# Patient Record
Sex: Female | Born: 1970 | Race: White | Hispanic: No | Marital: Married | Smoking: Never smoker
Health system: Southern US, Community
[De-identification: ages and names within clinical notes are randomized; demographics above are authoritative.]

## PROBLEM LIST (undated history)

## (undated) DIAGNOSIS — R350 Frequency of micturition: Secondary | ICD-10-CM

## (undated) DIAGNOSIS — F419 Anxiety disorder, unspecified: Secondary | ICD-10-CM

## (undated) DIAGNOSIS — R0789 Other chest pain: Secondary | ICD-10-CM

## (undated) DIAGNOSIS — G5712 Meralgia paresthetica, left lower limb: Principal | ICD-10-CM

## (undated) DIAGNOSIS — J45909 Unspecified asthma, uncomplicated: Secondary | ICD-10-CM

## (undated) DIAGNOSIS — E119 Type 2 diabetes mellitus without complications: Secondary | ICD-10-CM

## (undated) DIAGNOSIS — H3530 Unspecified macular degeneration: Secondary | ICD-10-CM

## (undated) DIAGNOSIS — E669 Obesity, unspecified: Secondary | ICD-10-CM

## (undated) DIAGNOSIS — R682 Dry mouth, unspecified: Secondary | ICD-10-CM

## (undated) HISTORY — PX: ABDOMINAL HYSTERECTOMY: SHX81

## (undated) HISTORY — PX: TONSILLECTOMY: SUR1361

## (undated) HISTORY — DX: Unspecified asthma, uncomplicated: J45.909

## (undated) HISTORY — DX: Other chest pain: R07.89

## (undated) HISTORY — DX: Dry mouth, unspecified: R68.2

## (undated) HISTORY — PX: ROOT CANAL: SHX2363

## (undated) HISTORY — PX: TOTAL ABDOMINAL HYSTERECTOMY: SHX209

## (undated) HISTORY — DX: Anxiety disorder, unspecified: F41.9

## (undated) HISTORY — PX: MOUTH SURGERY: SHX715

## (undated) HISTORY — DX: Unspecified macular degeneration: H35.30

## (undated) HISTORY — PX: HAND SURGERY: SHX662

## (undated) HISTORY — DX: Obesity, unspecified: E66.9

## (undated) HISTORY — DX: Frequency of micturition: R35.0

## (undated) HISTORY — DX: Meralgia paresthetica, left lower limb: G57.12

## (undated) HISTORY — PX: FOOT SURGERY: SHX648

## (undated) HISTORY — DX: Type 2 diabetes mellitus without complications: E11.9

## (undated) HISTORY — PX: NECK SURGERY: SHX720

---

## 1998-05-02 ENCOUNTER — Encounter: Admission: RE | Admit: 1998-05-02 | Discharge: 1998-05-02 | Payer: Self-pay | Admitting: Family Medicine

## 1998-07-24 ENCOUNTER — Emergency Department (HOSPITAL_COMMUNITY): Admission: EM | Admit: 1998-07-24 | Discharge: 1998-07-24 | Payer: Self-pay | Admitting: Emergency Medicine

## 1998-09-05 ENCOUNTER — Encounter: Admission: RE | Admit: 1998-09-05 | Discharge: 1998-09-05 | Payer: Self-pay | Admitting: Family Medicine

## 1998-10-02 ENCOUNTER — Encounter: Admission: RE | Admit: 1998-10-02 | Discharge: 1998-10-02 | Payer: Self-pay | Admitting: Family Medicine

## 1999-06-24 ENCOUNTER — Other Ambulatory Visit: Admission: RE | Admit: 1999-06-24 | Discharge: 1999-07-09 | Payer: Self-pay | Admitting: Family Medicine

## 1999-07-15 ENCOUNTER — Ambulatory Visit (HOSPITAL_COMMUNITY): Admission: RE | Admit: 1999-07-15 | Discharge: 1999-07-15 | Payer: Self-pay | Admitting: *Deleted

## 1999-07-15 ENCOUNTER — Encounter: Payer: Self-pay | Admitting: *Deleted

## 2001-06-22 ENCOUNTER — Other Ambulatory Visit: Admission: RE | Admit: 2001-06-22 | Discharge: 2001-06-22 | Payer: Self-pay | Admitting: Family Medicine

## 2002-12-25 ENCOUNTER — Ambulatory Visit (HOSPITAL_BASED_OUTPATIENT_CLINIC_OR_DEPARTMENT_OTHER): Admission: RE | Admit: 2002-12-25 | Discharge: 2002-12-25 | Payer: Self-pay | Admitting: Orthopedic Surgery

## 2003-04-30 ENCOUNTER — Other Ambulatory Visit: Admission: RE | Admit: 2003-04-30 | Discharge: 2003-04-30 | Payer: Self-pay | Admitting: Family Medicine

## 2003-05-23 ENCOUNTER — Encounter: Admission: RE | Admit: 2003-05-23 | Discharge: 2003-05-23 | Payer: Self-pay | Admitting: Family Medicine

## 2003-05-23 ENCOUNTER — Encounter: Payer: Self-pay | Admitting: Family Medicine

## 2003-06-14 ENCOUNTER — Ambulatory Visit (HOSPITAL_COMMUNITY): Admission: RE | Admit: 2003-06-14 | Discharge: 2003-06-14 | Payer: Self-pay | Admitting: Obstetrics & Gynecology

## 2003-06-14 ENCOUNTER — Encounter: Payer: Self-pay | Admitting: Obstetrics & Gynecology

## 2006-01-10 ENCOUNTER — Other Ambulatory Visit: Admission: RE | Admit: 2006-01-10 | Discharge: 2006-01-10 | Payer: Self-pay | Admitting: Family Medicine

## 2006-02-18 ENCOUNTER — Encounter (INDEPENDENT_AMBULATORY_CARE_PROVIDER_SITE_OTHER): Payer: Self-pay | Admitting: Specialist

## 2006-02-18 ENCOUNTER — Ambulatory Visit (HOSPITAL_COMMUNITY): Admission: RE | Admit: 2006-02-18 | Discharge: 2006-02-19 | Payer: Self-pay | Admitting: Obstetrics & Gynecology

## 2006-03-23 ENCOUNTER — Encounter: Payer: Self-pay | Admitting: Emergency Medicine

## 2009-09-10 ENCOUNTER — Encounter: Admission: RE | Admit: 2009-09-10 | Discharge: 2009-09-10 | Payer: Self-pay | Admitting: Obstetrics and Gynecology

## 2009-10-08 ENCOUNTER — Encounter: Admission: RE | Admit: 2009-10-08 | Discharge: 2009-10-08 | Payer: Self-pay | Admitting: Obstetrics and Gynecology

## 2011-04-09 NOTE — Op Note (Signed)
   NAME:  Lindsay Sanchez, Lindsay Sanchez NO.:  [id number]   MEDICAL RECORD NO.:  [id number]                   PATIENT TYPE:  AMB   LOCATION:  DSC                                  FACILITY:  [facility]   PHYSICIAN:  Cindee Salt, M.D.                    DATE OF BIRTH:  02-Jun-1971   DATE OF PROCEDURE:  12/25/2002  DATE OF DISCHARGE:                                 OPERATIVE REPORT   PREOPERATIVE DIAGNOSIS:  Carpal tunnel syndrome, left hand.   POSTOPERATIVE DIAGNOSIS:  Carpal tunnel syndrome, left hand.   OPERATION:  Decompression of left median nerve.   SURGEON:  Cindee Salt, M.D.   ASSISTANT:  Joaquin Courts, Nurse Specialist.   ANESTHESIA:  Forearm-based IV regional.   HISTORY:  The patient is a 40 year old female with a history of carpal  tunnel syndrome -- EMG and nerve conductions positive -- which has not  responded to conservative treatment to her left arm.   DESCRIPTION OF PROCEDURE:  The patient was brought to the operating room  where forearm-based IV regional anesthetic was carried out without  difficulty.  She was prepped and draped using Duraprep, supine position,  left arm free.  A longitudinal incision was made in the palm and carried  down through subcutaneous tissues.  Bleeders were electrocauterized.  Palmar  fascia was split, superficial palmar arch identified, the flexor tendons to  the ring and little fingers identified.  To the ulnar side of the median  nerve, the carpal retinaculum was incised with sharp dissection.  Right-  angle and Sewall retractor were placed between skin and forearm fascia.  The  fascia was released for approximately 3 cm proximal to the wrist crease  under direct vision.  Canal was explored.  Tenosynovial tissue was  thickened, otherwise, no further lesions were identified.  The wound was  irrigated.  The skin was closed with interrupted 5-0 nylon sutures.  Sterile  compressive dressing and splint were applied.  The patient  tolerated the  procedure well and was taken to the recovery room for observation in  satisfactory condition.   She is discharged home to return to the [facility] in one  week on Vicodin and Keflex.                                               Cindee Salt, M.D.    Angelique Blonder  D:  12/25/2002  T:  12/25/2002  Job:  [id number]

## 2011-04-09 NOTE — Op Note (Signed)
Lindsay Sanchez, Lindsay Sanchez              ACCOUNT NO.:  [id number]   MEDICAL RECORD NO.:  [id number]          PATIENT TYPE:  AMB   LOCATION:  DAY                          FACILITY:  [facility]   PHYSICIAN:  Genia Del, M.D.DATE OF BIRTH:  05/06/1971   DATE OF PROCEDURE:  02/18/2006  DATE OF DISCHARGE:                                 OPERATIVE REPORT   PREOPERATIVE DIAGNOSIS:  Menorrhagia.   POSTOPERATIVE DIAGNOSIS:  Menorrhagia with probable adenomyosis and  adhesions between bladder and anterior wall of the abdomen and between the  omentum and the anterior wall of the abdomen.   SURGEON:  Genia Del, M.D.   ASSISTANT:  Pershing Cox, M.D.   PROCTOR:  Dr. Claudette Laws.   ANESTHESIOLOGIST:  Jenelle Mages. Rica Mast, M.D.   PROCEDURE:  Laparoscopically-assisted vaginal hysterectomy assisted with  DaVinci robot.   DESCRIPTION OF PROCEDURE:  Under general anesthesia with endotracheal  intubation, the patient is in lithotomy position for operative laparoscopy.  She is prepped with Betadine on the abdominal, suprapubic, vulvar, and  vaginal areas and draped as usual.  We then put the bladder catheter in  place.  We performed a vaginal examination revealing an anteverted uterus,  slightly increased in size and no adnexal mass.  We then put the weighted  speculum in the vagina.  The anterior lip of the cervix was grasped with a  tenaculum.  The hysterometry is at 12 cm.  We dilated the cervix at dilator  #23 easily.  We then put in place the intrauterine cannula, the  RUMI with  the co-ring.  The co-ring is attached with two stitches of 0 Vicryl to the  cervix for better stability.  We then removed the tenacula and the weighted  speculum.  Abdominally, we measured with a ruler the site for the trocar.  We then infiltrate Marcaine 0.25% plain at all levels that were marked with  a sterile pen.  We then made an incision for the camera port at 23 cm from  the symphysis pubis just above  the umbilicus.  We attempt a Veress needle  insufflation, but probably because of her obesity, the pressure is higher  than average and for more security, the decision is taken to proceeding with  an open laparoscopy.  We therefore opened under direct vision the  aponeurosis at that level and a Roseanne Reno is inserted.  Pneumoperitoneum is  created with CO2.  Prior to the insertion of the trocar, a pursestring  stitch with Vicryl 0 was made on the aponeurosis.  We then inserted all of  the other ports under direct vision with the camera in place.  The two robot  arms, right and left, are done with the robotic 8 mm ports.  We have the  fourth arm on the left side with an 8 mm port as well, and we have the  assistant port on the right side, which is a 10 mm port.  When all trocars  were in place, we docked the robot and inserted the instruments which are a  [place] and shear scissors.  We put a grasper in place  in the fourth arm.  I then go to the console as my assistant stays by the bedside.  The  inspection of the abdominopelvic cavity reveals adhesions between the  omentum and the anterior abdominal wall on the left side as well as  adhesions between the bladder and the lower anterior abdominal wall.  We  released the omental adhesions with the shear scissors.  We then started the  hysterectomy.  The uterus appears slightly enlarged and very soft compatible  with adenomyosis.  Both ovaries are normal in size and appearance.  Both  tubes are normal in size and appearance except for a previous bilateral  tubal sterilization.  We started at the level of the left tube which has  already been sectioned.  We cauterized and sectioned successively at that  level and then the left utero-ovarian and continued towards the left round  ligament and then by the side of the uterus, the broad ligament is  cauterized and sectioned until we skeletonized the left uterine artery.  We  need to release the  adhesions anteriorly before opening the anterior  peritoneum.  We proceed with the shear scissors and the bipolar [place].  We then opened the anterior peritoneum and reclined the bladder downward.  The left uterine artery is cauterized close to the uterus with the [place].  We proceeded exactly the same way on the right side.  The surgery is made  longer because of the adhesions anteriorly and more difficulty reclining the  bladder, but this is done with careful dissection and no complications  occurs.  We also experienced problems with the pneumoperitoneum because of  the fourth arm failure about halfway in the case.  The fourth arm was not  necessary to finish the case.  It was therefore removed and an adequate  pneumoperitoneum was then obtained.  We then used the co-ring as a guide for  the resection of the vaginal mucosa in a circumferential fashion just above  the uterosacral ligament.  We freed the uterus completely and it is removed  from the abdominopelvic cavity through the vagina.  It is sent to pathology.  We then used a blue glove with sponges to make an obturation of the vagina  so that the pneumoperitoneum will be preserved during closure of the vaginal  vault.  We used 0 Vicryl on a CT2 to close the vaginal vault in figure-of-  eights.  The vaginal vault is well closed.  We verified hemostasis at all  pedicles and on the vaginal vault.  It is adequate.  We used fibrinogen  product in the pelvic area to improve further hemostasis.  We then evacuated  the CO2 after removing all instruments.  We undocked the robot.  We then  attached the suture at the supraumbilical incision that was on the  aponeurosis.  We put Vicryl 4-0 stitches at the skin on all incisions, a  subcuticular stitch of 4-0 Vicryl is done at the supraumbilical incision.  We then put Dermabond on each incision.  The estimated blood loss was 300 mL.  The urine output was 750 mL.  The patient received Ancef 1  gram IV at  the beginning of the surgery.  The count of instruments and sponges was  complete.  No complications occurred and the patient was brought to the  recovery room in good status.      Genia Del, M.D.  Electronically Signed     ML/MEDQ  D:  02/18/2006  T:  02/22/2006  Job:  [id number]

## 2011-09-08 ENCOUNTER — Ambulatory Visit: Payer: Self-pay | Admitting: Physical Therapy

## 2011-11-19 SURGERY — Surgical Case
Anesthesia: *Unknown

## 2011-12-03 ENCOUNTER — Other Ambulatory Visit: Payer: Self-pay | Admitting: Family Medicine

## 2011-12-03 DIAGNOSIS — Z1231 Encounter for screening mammogram for malignant neoplasm of breast: Secondary | ICD-10-CM

## 2011-12-08 ENCOUNTER — Ambulatory Visit
Admission: RE | Admit: 2011-12-08 | Discharge: 2011-12-08 | Disposition: A | Discharge status: Home / Self Care | Payer: BC Managed Care – PPO | Source: Ambulatory Visit | Attending: Neurosurgery | Admitting: Neurosurgery

## 2011-12-08 ENCOUNTER — Other Ambulatory Visit: Payer: Self-pay | Admitting: Neurosurgery

## 2011-12-08 DIAGNOSIS — M5020 Other cervical disc displacement, unspecified cervical region: Secondary | ICD-10-CM

## 2011-12-16 ENCOUNTER — Ambulatory Visit: Payer: Self-pay

## 2012-01-05 ENCOUNTER — Ambulatory Visit
Admission: RE | Admit: 2012-01-05 | Discharge: 2012-01-05 | Disposition: A | Discharge status: Home / Self Care | Payer: BC Managed Care – PPO | Source: Ambulatory Visit | Attending: Family Medicine | Admitting: Family Medicine

## 2012-01-05 DIAGNOSIS — Z1231 Encounter for screening mammogram for malignant neoplasm of breast: Secondary | ICD-10-CM

## 2012-09-27 ENCOUNTER — Encounter: Payer: Self-pay | Admitting: Cardiology

## 2012-09-27 ENCOUNTER — Ambulatory Visit (INDEPENDENT_AMBULATORY_CARE_PROVIDER_SITE_OTHER): Payer: BC Managed Care – PPO | Admitting: Cardiology

## 2012-09-27 VITALS — BP 118/80 | HR 62 | Ht 66.0 in | Wt 275.0 lb

## 2012-09-27 DIAGNOSIS — E119 Type 2 diabetes mellitus without complications: Secondary | ICD-10-CM

## 2012-09-27 DIAGNOSIS — Z8249 Family history of ischemic heart disease and other diseases of the circulatory system: Secondary | ICD-10-CM | POA: Insufficient documentation

## 2012-09-27 DIAGNOSIS — E118 Type 2 diabetes mellitus with unspecified complications: Secondary | ICD-10-CM | POA: Insufficient documentation

## 2012-09-27 DIAGNOSIS — E669 Obesity, unspecified: Secondary | ICD-10-CM

## 2012-09-27 DIAGNOSIS — R0789 Other chest pain: Secondary | ICD-10-CM

## 2012-09-27 HISTORY — DX: Obesity, unspecified: E66.9

## 2012-09-27 NOTE — Patient Instructions (Addendum)
We will schedule you for a stress Echo.  Exercise Stress Echocardiography A stress echocardiogram, sometimes called an exercise or stress echo, is a test that combines a treadmill stress test and an echocardiogram. Sometimes a treadmill test alone gives uncertain results. The addition of an echocardiogram can improve accuracy in diagnosing heart disease. A stress echo helps your caregiver know if you need more tests on your heart or further treatment. A stress echo is done to:  Look for blocked arteries in your heart.  Detect irregular heart rhythms.  Determine how hard you are able to exercise before your heart gets into trouble or develops problems.  Show how fast your heart recovers after exercise.  If for some reason you are unable to exercise, this test can also be done with the use of medications which can speed up and stress your heart. BEFORE THE PROCEDURE  You may bathe or shower the morning of the test. Do not apply body lotions or powder to your chest.  You should be present 15 minutes prior to your test or as instructed by your caregiver.  Wear comfortable clothes and good walking or running shoes.  Follow your caregiver's instructions regarding eating and drinking before the test.  Do not smoke for 4 hours prior to the test.  Take your medications as directed at regular times with water unless instructed otherwise. If you are on insulin, ask how you are to take it and if there are special instructions. It is common to adjust insulin dosing the morning of the examination.  Beta blocker medications interfere with the test. If you are on a beta blocker, do not take this medication for 72 hours before the test unless your caregiver instructs you otherwise. Be sure to ask if you do not understand. This must be discussed with your caregiver prior to your appointment.  If you wear a nitroglycerin patch, it may need to be removed prior to the test. Ask your caregiver if the patch  should be removed. PROCEDURE   An echocardiogram will be done before exercise and again at peak heart rate. The echocardiogram uses sound waves (ultrasound) to provide an image of the heart's internal structures, size and movement. This image is produced by moving a transducer (a very sensitive wand-like device) over the chest area. A jelly like substance is put on your chest to improve the contact between the wand and your chest. Images of the beating heart are made by bouncing high-frequency (ultrasound) sound waves off the heart.  Electrodes placed on the chest will monitor the heart's rate and rhythm throughout the test. If you have a hairy chest, small areas may have to be shaved to get better contact with the electrodes. Once the electrodes are attached to your body, multiple wires will be attached to the electrodes. These are then attached to an (EKG) machine. The EKG will monitor your heart's rate and rhythm (regularity).  Your caregiver (often a cardiologist) will have you walk on a treadmill. The treadmill will gradually be made to increase in speed and incline. You will exercise up to 15 minutes depending upon your condition and the condition of your heart.  The test will be stopped if you become too tired. It will also be stopped if you develop chest pain, blood pressure problems, abnormal heart rhythms or signs on the EKG that suggest the heart is overly stressed and not getting enough blood.  At the peak of exercise, the treadmill will be stopped. You will lie  down immediately on a bed so that a second echocardiogram can be taken to visualize the heart's motion with exercise.  The test usually takes 30-60 minutes to complete. HOME CARE INSTRUCTIONS   Resume your pre-hospital medications, activity and diet unless instructed otherwise.  Keep your follow-up appointments as instructed. SEEK IMMEDIATE MEDICAL CARE IF:  You have pain or pressure in the following areas:  Chest.  Jaw  or neck.  Between your shoulder blades.  Pain radiating down your left arm.  You have nausea (feeling sick to your stomach).  You vomit.  You faint.  You feel short of breath. MAKE SURE YOU:   Understand these instructions.  Will watch your condition.  Will get help right away if you are not doing well or get worse. Document Released: 11/12/2004 Document Revised: 01/31/2012 Document Reviewed: 02/18/2009 [facility] Patient Information 2013 [place], [place].

## 2012-09-27 NOTE — Progress Notes (Signed)
Lillard Anes Date of Birth: 02-07-1971 Medical Record #[id number]  History of Present Illness: Lindsay Sanchez is seen today for evaluation of chest pain. She is seen at the request of Dr. Rana Snare. Is a 41 year old white female with history of morbid obesity and diabetes mellitus. She reports that 4 weeks ago while driving in her car she developed acute mid substernal chest pain radiating into her left shoulder. It hurts so bad she had to pull over. It lasted 3-4 minutes and then abated. She went home and took a nap and felt better. She states the pressure was so severe she couldn't get her breath. Since then she has had no recurrent chest pain or pressure. She denies any history of hypercholesterolemia or hypertension. She is a nonsmoker. She does have a prior history of asthma and anxiety attacks.  Current Outpatient Prescriptions on File Prior to Visit  Medication Sig Dispense Refill  . metFORMIN (GLUMETZA) 1000 MG (MOD) 24 hr tablet Take 1,000 mg by mouth 2 (two) times daily with a meal.        No Known Allergies  Past Medical History  Diagnosis Date  . Diabetes   . Urinary frequency   . Chest tightness   . Asthma   . Anxiety     anxiety attacks are infrequent  . Obesity 09/27/2012    Past Surgical History  Procedure Date  . Neck surgery   . Cesarean section   . Hand surgery     History  Smoking status  . Never Smoker   Smokeless tobacco  . Not on file    History  Alcohol Use No    Family History  Problem Relation Age of Onset  . Arrhythmia Mother     V TACH  . Heart attack Father   . Heart attack Maternal Grandfather   . Cancer Maternal Grandfather   . Heart disease Paternal Grandmother     Review of Systems: As noted in history of present illness. All other systems were reviewed and are negative.  Physical Exam: BP 118/80  Pulse 62  Ht 5\' 6"  (1.676 m)  Wt 124.739 kg (275 lb)  BMI 44.39 kg/m2 She is a pleasant, obese white female in no acute distress.The  patient is alert and oriented x 3.  The mood and affect are normal.  The skin is warm and dry.  Color is normal.  The HEENT exam reveals that the sclera are nonicteric.  The mucous membranes are moist.  The carotids are 2+ without bruits.  There is no thyromegaly.  There is no JVD.  The lungs are clear.  The chest wall is non tender.  The heart exam reveals a regular rate with a normal S1 and S2.  There are no murmurs, gallops, or rubs.  The PMI is not displaced.   Abdominal exam reveals good bowel sounds.  There is no guarding or rebound.  There is no hepatosplenomegaly or tenderness.  There are no masses.  Exam of the legs reveal no clubbing, cyanosis, or edema.  The legs are without rashes.  The distal pulses are intact.  Cranial nerves II - XII are intact.  Motor and sensory functions are intact.  The gait is normal.  LABORATORY DATA: ECG today shows normal sinus rhythm with a normal ECG.  Assessment / Plan: 1. Atypical chest pain. Patient is at moderate risk for coronary disease given her history of diabetes and obesity. I recommended further risk stratification with a stress echo. If this  is normal we can reassure her and recommend weight loss and regular aerobic exercise. Next  2. Morbid obesity.  3. Diabetes mellitus type 2 well controlled

## 2012-10-09 ENCOUNTER — Ambulatory Visit (HOSPITAL_COMMUNITY): Payer: BC Managed Care – PPO | Attending: Cardiology

## 2012-10-09 ENCOUNTER — Encounter: Payer: Self-pay | Admitting: Cardiology

## 2012-10-09 DIAGNOSIS — R0989 Other specified symptoms and signs involving the circulatory and respiratory systems: Secondary | ICD-10-CM | POA: Insufficient documentation

## 2012-10-09 DIAGNOSIS — E119 Type 2 diabetes mellitus without complications: Secondary | ICD-10-CM

## 2012-10-09 DIAGNOSIS — R5383 Other fatigue: Secondary | ICD-10-CM | POA: Insufficient documentation

## 2012-10-09 DIAGNOSIS — R0789 Other chest pain: Secondary | ICD-10-CM

## 2012-10-09 DIAGNOSIS — R072 Precordial pain: Secondary | ICD-10-CM

## 2012-10-09 DIAGNOSIS — E669 Obesity, unspecified: Secondary | ICD-10-CM

## 2012-10-09 DIAGNOSIS — Z8249 Family history of ischemic heart disease and other diseases of the circulatory system: Secondary | ICD-10-CM | POA: Insufficient documentation

## 2012-10-09 DIAGNOSIS — R5381 Other malaise: Secondary | ICD-10-CM | POA: Insufficient documentation

## 2012-10-09 DIAGNOSIS — R0609 Other forms of dyspnea: Secondary | ICD-10-CM | POA: Insufficient documentation

## 2012-10-09 NOTE — Progress Notes (Signed)
Echocardiogram performed.  

## 2013-01-06 ENCOUNTER — Other Ambulatory Visit: Payer: Self-pay

## 2013-09-11 ENCOUNTER — Other Ambulatory Visit: Payer: Self-pay

## 2013-09-11 DIAGNOSIS — Z1231 Encounter for screening mammogram for malignant neoplasm of breast: Secondary | ICD-10-CM

## 2013-09-27 ENCOUNTER — Other Ambulatory Visit: Payer: Self-pay

## 2013-09-28 ENCOUNTER — Ambulatory Visit: Payer: BC Managed Care – PPO

## 2013-10-08 ENCOUNTER — Ambulatory Visit
Admission: RE | Admit: 2013-10-08 | Discharge: 2013-10-08 | Disposition: A | Discharge status: Home / Self Care | Payer: 59 | Source: Ambulatory Visit

## 2013-10-08 ENCOUNTER — Other Ambulatory Visit: Payer: Self-pay | Admitting: Family Medicine

## 2013-10-08 DIAGNOSIS — N63 Unspecified lump in unspecified breast: Secondary | ICD-10-CM

## 2013-10-08 DIAGNOSIS — Z1231 Encounter for screening mammogram for malignant neoplasm of breast: Secondary | ICD-10-CM

## 2013-10-22 ENCOUNTER — Other Ambulatory Visit: Payer: 59

## 2014-07-26 ENCOUNTER — Ambulatory Visit
Admission: RE | Admit: 2014-07-26 | Discharge: 2014-07-26 | Disposition: A | Discharge status: Home / Self Care | Payer: 59 | Source: Ambulatory Visit | Attending: Family Medicine | Admitting: Family Medicine

## 2014-07-26 DIAGNOSIS — N63 Unspecified lump in unspecified breast: Secondary | ICD-10-CM

## 2014-07-31 ENCOUNTER — Other Ambulatory Visit: Payer: Self-pay | Admitting: Obstetrics and Gynecology

## 2014-08-02 LAB — CYTOLOGY - PAP

## 2014-08-08 ENCOUNTER — Other Ambulatory Visit: Payer: Self-pay | Admitting: Obstetrics and Gynecology

## 2014-08-08 DIAGNOSIS — Z803 Family history of malignant neoplasm of breast: Secondary | ICD-10-CM

## 2014-08-08 DIAGNOSIS — Z9189 Other specified personal risk factors, not elsewhere classified: Secondary | ICD-10-CM

## 2014-08-23 ENCOUNTER — Ambulatory Visit
Admission: RE | Admit: 2014-08-23 | Discharge: 2014-08-23 | Disposition: A | Discharge status: Home / Self Care | Payer: 59 | Source: Ambulatory Visit | Attending: Obstetrics and Gynecology | Admitting: Obstetrics and Gynecology

## 2014-08-23 DIAGNOSIS — Z803 Family history of malignant neoplasm of breast: Secondary | ICD-10-CM

## 2014-08-23 DIAGNOSIS — Z9189 Other specified personal risk factors, not elsewhere classified: Secondary | ICD-10-CM

## 2014-08-23 MED ORDER — GADOBENATE DIMEGLUMINE 529 MG/ML IV SOLN
20.0000 mL | Freq: Once | INTRAVENOUS | Status: AC | PRN
  Administered 2014-08-23: 20 mL via INTRAVENOUS

## 2015-06-03 ENCOUNTER — Encounter: Payer: Self-pay | Admitting: Genetic Counselor

## 2015-07-04 ENCOUNTER — Encounter: Payer: Self-pay | Admitting: Genetic Counselor

## 2015-07-04 ENCOUNTER — Other Ambulatory Visit: Payer: Self-pay

## 2015-07-04 DIAGNOSIS — Z1231 Encounter for screening mammogram for malignant neoplasm of breast: Secondary | ICD-10-CM

## 2015-08-08 ENCOUNTER — Ambulatory Visit
Admission: RE | Admit: 2015-08-08 | Discharge: 2015-08-08 | Disposition: A | Discharge status: Home / Self Care | Payer: Commercial Managed Care - HMO | Source: Ambulatory Visit

## 2015-08-08 DIAGNOSIS — Z1231 Encounter for screening mammogram for malignant neoplasm of breast: Secondary | ICD-10-CM

## 2015-11-18 ENCOUNTER — Other Ambulatory Visit (HOSPITAL_BASED_OUTPATIENT_CLINIC_OR_DEPARTMENT_OTHER): Payer: Self-pay | Admitting: Family Medicine

## 2015-11-18 DIAGNOSIS — R202 Paresthesia of skin: Secondary | ICD-10-CM

## 2015-11-18 DIAGNOSIS — M545 Low back pain, unspecified: Secondary | ICD-10-CM

## 2015-11-19 ENCOUNTER — Other Ambulatory Visit: Payer: Self-pay | Admitting: Orthopedic Surgery

## 2015-11-22 ENCOUNTER — Ambulatory Visit (HOSPITAL_BASED_OUTPATIENT_CLINIC_OR_DEPARTMENT_OTHER)
Admission: RE | Admit: 2015-11-22 | Discharge: 2015-11-22 | Disposition: A | Discharge status: Home / Self Care | Payer: Commercial Managed Care - HMO | Source: Ambulatory Visit | Attending: Family Medicine | Admitting: Family Medicine

## 2015-11-22 DIAGNOSIS — Q7649 Other congenital malformations of spine, not associated with scoliosis: Secondary | ICD-10-CM | POA: Insufficient documentation

## 2015-11-22 DIAGNOSIS — Q799 Congenital malformation of musculoskeletal system, unspecified: Secondary | ICD-10-CM | POA: Insufficient documentation

## 2015-11-22 DIAGNOSIS — M545 Low back pain, unspecified: Secondary | ICD-10-CM

## 2015-11-22 DIAGNOSIS — R202 Paresthesia of skin: Secondary | ICD-10-CM | POA: Insufficient documentation

## 2015-11-22 DIAGNOSIS — R200 Anesthesia of skin: Secondary | ICD-10-CM | POA: Diagnosis not present

## 2015-11-22 DIAGNOSIS — M47896 Other spondylosis, lumbar region: Secondary | ICD-10-CM | POA: Diagnosis not present

## 2015-12-12 ENCOUNTER — Encounter: Payer: Self-pay | Admitting: Neurology

## 2015-12-12 ENCOUNTER — Ambulatory Visit (INDEPENDENT_AMBULATORY_CARE_PROVIDER_SITE_OTHER): Payer: No Typology Code available for payment source | Admitting: Neurology

## 2015-12-12 VITALS — BP 124/84 | HR 76 | Ht 67.0 in | Wt 304.0 lb

## 2015-12-12 DIAGNOSIS — G5712 Meralgia paresthetica, left lower limb: Secondary | ICD-10-CM | POA: Insufficient documentation

## 2015-12-12 HISTORY — DX: Meralgia paresthetica, left lower limb: G57.12

## 2015-12-12 NOTE — Progress Notes (Signed)
Reason for visit: Left leg numbness  Referring physician: Dr. [place]  Lindsay Sanchez is a 45 y.o. female  History of present illness:  Lindsay Sanchez is a 45 year old right-handed white female with a history of diabetes and obesity. The patient has had a two-year history of numbness and paresthesias involving the anterolateral aspect of the left thigh. She also has some problems with low back pain that appears to be worse when she is standing for too long, or partially stooped. The patient works as a Research scientist (medical), and she puts herself in a partially flexed position because of her job which increases her back pain. There is no pain that radiates down the legs. She denies any weakness of the legs, she does have some stress incontinence of the bladder that is chronic in nature. She denies any issues controlling the bowels. She has had cervical spine surgery in the past, she does have some residual neck discomfort and some right shoulder pain. She denies any pain radiating down the arms to the hands. She has had some problems with obesity, the onset of the left eye numbness came on shortly after she had regained about 100 pounds of weight. The patient has had MRI evaluation of the lumbar spine that did not show evidence of nerve root impingement. She is sent to this office for an evaluation.  Past Medical History  Diagnosis Date  . Diabetes (HCC)   . Urinary frequency   . Chest tightness   . Asthma   . Anxiety     anxiety attacks are infrequent  . Obesity 09/27/2012  . Meralgia paresthetica of left side 12/12/2015    Past Surgical History  Procedure Laterality Date  . Neck surgery    . Cesarean section    . Hand surgery    . Tonsillectomy    . Abdominal hysterectomy    . Foot surgery      Family History  Problem Relation Age of Onset  . Arrhythmia Mother     V TACH  . Breast cancer Mother 74    recurrance at 60  . Ovarian cancer Mother   . Heart attack Father   . Heart attack  Maternal Grandfather   . Cancer Maternal Grandfather   . Heart disease Paternal Grandmother   . Breast cancer Maternal Aunt   . Breast cancer Maternal Aunt   . Breast cancer Cousin     maternal first cousin    Social history:  reports that she has never smoked. She has never used smokeless tobacco. She reports that she drinks alcohol. She reports that she does not use illicit drugs.  Medications:  Prior to Admission medications   Medication Sig Start Date End Date Taking? Authorizing Provider  metFORMIN (GLUMETZA) 1000 MG (MOD) 24 hr tablet Take 1,000 mg by mouth 2 (two) times daily with a meal.   Yes Historical Provider, MD  Multiple Vitamin (MULTIVITAMIN) tablet Take 1 tablet by mouth daily.   Yes Historical Provider, MD  UNABLE TO FIND Take 1 tablet by mouth daily. Med Name: Jordanance   Yes Historical Provider, MD     No Known Allergies  ROS:  Out of a complete 14 system review of symptoms, the patient complains only of the following symptoms, and all other reviewed systems are negative.  Fatigue Ringing in the ears Itching Easy bruising Feeling cold Allergies Headache, numbness Not enough sleep, insomnia   Blood pressure 124/84, pulse 76, height  (1.702 m), weight 304  lb (137.893 kg).  Physical Exam  General: The patient is alert and cooperative at the time of the examination.The patient is markedly obese.  Eyes: Pupils are equal, round, and reactive to light. Discs are flat bilaterally.  Neck: The neck is supple, no carotid bruits are noted.  Respiratory: The respiratory examination is clear.  Cardiovascular: The cardiovascular examination reveals a regular rate and rhythm, no obvious murmurs or rubs are noted.  Neuromuscular: The patient is able to flex to about 130 with the lumbosacral spine.  Skin: Extremities are without significant edema.  Neurologic Exam  Mental status: The patient is alert and oriented x 3 at the time of the examination. The  patient has apparent normal recent and remote memory, with an apparently normal attention span and concentration ability.  Cranial nerves: Facial symmetry is present. There is good sensation of the face to pinprick and soft touch bilaterally. The strength of the facial muscles and the muscles to head turning and shoulder shrug are normal bilaterally. Speech is well enunciated, no aphasia or dysarthria is noted. Extraocular movements are full. Visual fields are full. The tongue is midline, and the patient has symmetric elevation of the soft palate. No obvious hearing deficits are noted.  Motor: The motor testing reveals 5 over 5 strength of all 4 extremities. Good symmetric motor tone is noted throughout.  Sensory: Sensory testing is intact to pinprick, soft touch, vibration sensation, and position sense on all 4 extremities. No evidence of extinction is noted.  Coordination: Cerebellar testing reveals good finger-nose-finger and heel-to-shin bilaterally.  Gait and station: Gait is normal. Tandem gait is normal. Romberg is negative. No drift is seen.  Reflexes: Deep tendon reflexes are symmetric, but are depressed  bilaterally. Toes are downgoing bilaterally.   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 Utilizing this level assignment, the current examination incorporates from the T12-L1 disc space through the lower sacrum. This will need to be understood by anyone involved in the patient's future care.  No evidence of lumbar disc herniation causing nerve root compression.  Facet degenerative changes most prominent L4-5. Minimal bilateral L4-5 foraminal narrowing without nerve root compression.  L5-S1 mild bulge without spinal stenosis.  * MRI scan images were reviewed online. I agree with the written report.    Assessment/Plan:  1. Left meralgia paresthetica   2. Chronic low back pain, neuromuscular pain    3. Diabetes   4. Obesity   The patient appears to have a left meralgia paresthetica, and some neuromuscular discomfort involving the low back. These issues are associated with being overweight. If she can lose weight, the meralgia paresthetica and back pain will disappear. The patient is considering bariatric surgery. At this point, the patient will follow-up through this office on an as-needed basis.   Marlan Palau MD 12/12/2015 3:52 PM  [facility] [address] [place], [place] [address]  Phone [phone] Fax [phone]

## 2015-12-18 ENCOUNTER — Encounter (HOSPITAL_BASED_OUTPATIENT_CLINIC_OR_DEPARTMENT_OTHER): Payer: Self-pay | Admitting: *Deleted

## 2015-12-19 ENCOUNTER — Encounter (HOSPITAL_BASED_OUTPATIENT_CLINIC_OR_DEPARTMENT_OTHER)
Admission: RE | Admit: 2015-12-19 | Discharge: 2015-12-19 | Disposition: A | Discharge status: Home / Self Care | Payer: No Typology Code available for payment source | Source: Ambulatory Visit | Attending: Orthopedic Surgery | Admitting: Orthopedic Surgery

## 2015-12-19 ENCOUNTER — Other Ambulatory Visit: Payer: Self-pay

## 2015-12-19 DIAGNOSIS — E119 Type 2 diabetes mellitus without complications: Secondary | ICD-10-CM | POA: Insufficient documentation

## 2015-12-19 DIAGNOSIS — Z01812 Encounter for preprocedural laboratory examination: Secondary | ICD-10-CM | POA: Insufficient documentation

## 2015-12-19 DIAGNOSIS — Z01810 Encounter for preprocedural cardiovascular examination: Secondary | ICD-10-CM | POA: Insufficient documentation

## 2015-12-19 LAB — BASIC METABOLIC PANEL
Anion gap: 11 (ref 5–15)
BUN: 10 mg/dL (ref 6–20)
CHLORIDE: 104 mmol/L (ref 101–111)
CO2: 25 mmol/L (ref 22–32)
CREATININE: 0.76 mg/dL (ref 0.44–1.00)
Calcium: 9.9 mg/dL (ref 8.9–10.3)
GFR calc Af Amer: >60 mL/min (ref >60)
GFR calc non Af Amer: >60 mL/min (ref >60)
GLUCOSE: 175 mg/dL — AB (ref 65–99)
Potassium: 4.7 mmol/L (ref 3.5–5.1)
Sodium: 140 mmol/L (ref 135–145)

## 2015-12-19 NOTE — Progress Notes (Signed)
PT in for PAT apt and Anesthesia consult. Seen by Dr. Okey Dupre. Ok for surgery 12/25/15 with Dr. Victorino Dike, no further testing needed.

## 2015-12-25 ENCOUNTER — Ambulatory Visit (HOSPITAL_BASED_OUTPATIENT_CLINIC_OR_DEPARTMENT_OTHER)
Admission: RE | Admit: 2015-12-25 | Discharge: 2015-12-25 | Disposition: A | Discharge status: Home / Self Care | Payer: No Typology Code available for payment source | Source: Ambulatory Visit | Attending: Orthopedic Surgery | Admitting: Orthopedic Surgery

## 2015-12-25 ENCOUNTER — Encounter (HOSPITAL_BASED_OUTPATIENT_CLINIC_OR_DEPARTMENT_OTHER): Payer: Self-pay | Admitting: Certified Registered"

## 2015-12-25 ENCOUNTER — Ambulatory Visit (HOSPITAL_BASED_OUTPATIENT_CLINIC_OR_DEPARTMENT_OTHER): Payer: No Typology Code available for payment source | Admitting: Certified Registered"

## 2015-12-25 ENCOUNTER — Encounter (HOSPITAL_BASED_OUTPATIENT_CLINIC_OR_DEPARTMENT_OTHER)
Admission: RE | Disposition: A | Discharge status: Home / Self Care | Payer: Self-pay | Source: Ambulatory Visit | Attending: Orthopedic Surgery

## 2015-12-25 DIAGNOSIS — T8484XA Pain due to internal orthopedic prosthetic devices, implants and grafts, initial encounter: Secondary | ICD-10-CM | POA: Insufficient documentation

## 2015-12-25 DIAGNOSIS — E119 Type 2 diabetes mellitus without complications: Secondary | ICD-10-CM | POA: Diagnosis not present

## 2015-12-25 DIAGNOSIS — M2041 Other hammer toe(s) (acquired), right foot: Secondary | ICD-10-CM | POA: Insufficient documentation

## 2015-12-25 DIAGNOSIS — J45909 Unspecified asthma, uncomplicated: Secondary | ICD-10-CM | POA: Diagnosis not present

## 2015-12-25 DIAGNOSIS — M7741 Metatarsalgia, right foot: Secondary | ICD-10-CM | POA: Insufficient documentation

## 2015-12-25 DIAGNOSIS — M25571 Pain in right ankle and joints of right foot: Secondary | ICD-10-CM

## 2015-12-25 DIAGNOSIS — Y831 Surgical operation with implant of artificial internal device as the cause of abnormal reaction of the patient, or of later complication, without mention of misadventure at the time of the procedure: Secondary | ICD-10-CM | POA: Insufficient documentation

## 2015-12-25 DIAGNOSIS — Z7984 Long term (current) use of oral hypoglycemic drugs: Secondary | ICD-10-CM | POA: Diagnosis not present

## 2015-12-25 DIAGNOSIS — Z6841 Body Mass Index (BMI) 40.0 and over, adult: Secondary | ICD-10-CM | POA: Diagnosis not present

## 2015-12-25 DIAGNOSIS — F411 Generalized anxiety disorder: Secondary | ICD-10-CM | POA: Diagnosis not present

## 2015-12-25 HISTORY — PX: HARDWARE REMOVAL: SHX979

## 2015-12-25 HISTORY — PX: HAMMERTOE RECONSTRUCTION WITH WEIL OSTEOTOMY: SHX5631

## 2015-12-25 LAB — GLUCOSE, CAPILLARY
GLUCOSE-CAPILLARY: 115 mg/dL — AB (ref 65–99)
Glucose-Capillary: 159 mg/dL — ABNORMAL HIGH (ref 65–99)

## 2015-12-25 SURGERY — HAMMERTOE RECONSTRUCTION WITH WEIL OSTEOTOMY
Anesthesia: Regional | Site: Foot | Laterality: Right

## 2015-12-25 MED ORDER — FENTANYL CITRATE (PF) 100 MCG/2ML IJ SOLN
INTRAMUSCULAR | Status: AC
  Filled 2015-12-25: qty 2

## 2015-12-25 MED ORDER — CHLORHEXIDINE GLUCONATE 4 % EX LIQD: 60.0000 mL | Freq: Once | CUTANEOUS | Status: DC

## 2015-12-25 MED ORDER — OXYCODONE HCL 5 MG PO TABS: 5.0000 mg | ORAL_TABLET | ORAL | Status: DC | PRN

## 2015-12-25 MED ORDER — LIDOCAINE HCL (CARDIAC) 20 MG/ML IV SOLN
INTRAVENOUS | Status: AC
  Filled 2015-12-25: qty 5

## 2015-12-25 MED ORDER — 0.9 % SODIUM CHLORIDE (POUR BTL) OPTIME
TOPICAL | Status: DC | PRN
  Administered 2015-12-25: 200 mL

## 2015-12-25 MED ORDER — DEXAMETHASONE SODIUM PHOSPHATE 10 MG/ML IJ SOLN
INTRAMUSCULAR | Status: AC
  Filled 2015-12-25: qty 1

## 2015-12-25 MED ORDER — MIDAZOLAM HCL 2 MG/2ML IJ SOLN
INTRAMUSCULAR | Status: AC
  Filled 2015-12-25: qty 2

## 2015-12-25 MED ORDER — FENTANYL CITRATE (PF) 100 MCG/2ML IJ SOLN
50.0000 ug | INTRAMUSCULAR | Status: AC | PRN
  Administered 2015-12-25 (×3): 50 ug via INTRAVENOUS

## 2015-12-25 MED ORDER — BUPIVACAINE-EPINEPHRINE (PF) 0.5% -1:200000 IJ SOLN
INTRAMUSCULAR | Status: DC | PRN
  Administered 2015-12-25: 30 mL via PERINEURAL

## 2015-12-25 MED ORDER — SCOPOLAMINE 1 MG/3DAYS TD PT72: 1.0000 | MEDICATED_PATCH | Freq: Once | TRANSDERMAL | Status: DC | PRN

## 2015-12-25 MED ORDER — DEXAMETHASONE SODIUM PHOSPHATE 10 MG/ML IJ SOLN
INTRAMUSCULAR | Status: DC | PRN
  Administered 2015-12-25: 6 mg via INTRAVENOUS

## 2015-12-25 MED ORDER — CEFAZOLIN SODIUM-DEXTROSE 2-3 GM-% IV SOLR
INTRAVENOUS | Status: AC
  Filled 2015-12-25: qty 50

## 2015-12-25 MED ORDER — SENNA 8.6 MG PO TABS: 2.0000 | ORAL_TABLET | Freq: Two times a day (BID) | ORAL | Status: DC

## 2015-12-25 MED ORDER — OXYCODONE-ACETAMINOPHEN 5-325 MG PO TABS
ORAL_TABLET | ORAL | Status: AC
  Filled 2015-12-25: qty 1

## 2015-12-25 MED ORDER — MIDAZOLAM HCL 2 MG/2ML IJ SOLN
1.0000 mg | INTRAMUSCULAR | Status: DC | PRN
  Administered 2015-12-25: 1 mg via INTRAVENOUS
  Administered 2015-12-25: 2 mg via INTRAVENOUS

## 2015-12-25 MED ORDER — OXYCODONE-ACETAMINOPHEN 5-325 MG PO TABS: 1.0000 | ORAL_TABLET | Freq: Once | ORAL | Status: DC

## 2015-12-25 MED ORDER — LIDOCAINE HCL (CARDIAC) 20 MG/ML IV SOLN
INTRAVENOUS | Status: DC | PRN
  Administered 2015-12-25: 20 mg via INTRAVENOUS

## 2015-12-25 MED ORDER — GLYCOPYRROLATE 0.2 MG/ML IJ SOLN: 0.2000 mg | Freq: Once | INTRAMUSCULAR | Status: DC | PRN

## 2015-12-25 MED ORDER — ONDANSETRON HCL 4 MG/2ML IJ SOLN
INTRAMUSCULAR | Status: AC
  Filled 2015-12-25: qty 2

## 2015-12-25 MED ORDER — LACTATED RINGERS IV SOLN
INTRAVENOUS | Status: DC
  Administered 2015-12-25 (×2): via INTRAVENOUS

## 2015-12-25 MED ORDER — SODIUM CHLORIDE 0.9 % IV SOLN: INTRAVENOUS | Status: DC

## 2015-12-25 MED ORDER — VANCOMYCIN HCL 500 MG IV SOLR
INTRAVENOUS | Status: DC | PRN
  Administered 2015-12-25: 500 mg via TOPICAL

## 2015-12-25 MED ORDER — CEFAZOLIN SODIUM-DEXTROSE 2-3 GM-% IV SOLR
2.0000 g | INTRAVENOUS | Status: AC
  Administered 2015-12-25: 3 g via INTRAVENOUS

## 2015-12-25 MED ORDER — DOCUSATE SODIUM 100 MG PO CAPS: 100.0000 mg | ORAL_CAPSULE | Freq: Two times a day (BID) | ORAL | Status: DC

## 2015-12-25 MED ORDER — PROPOFOL 10 MG/ML IV BOLUS
INTRAVENOUS | Status: DC | PRN
  Administered 2015-12-25: 260 mg via INTRAVENOUS

## 2015-12-25 MED ORDER — ONDANSETRON HCL 4 MG/2ML IJ SOLN
INTRAMUSCULAR | Status: DC | PRN
  Administered 2015-12-25: 4 mg via INTRAVENOUS

## 2015-12-25 SURGICAL SUPPLY — 79 items
BANDAGE ESMARK 6X9 LF (GAUZE/BANDAGES/DRESSINGS) ×1 IMPLANT
BLADE AVERAGE 25MMX9MM (BLADE)
BLADE AVERAGE 25X9 (BLADE) IMPLANT
BLADE LONG MED 25X9 (BLADE) ×2 IMPLANT
BLADE LONG MED 25X9MM (BLADE) ×1
BLADE OSC/SAG .038X5.5 CUT EDG (BLADE) ×3 IMPLANT
BLADE SURG 15 STRL LF DISP TIS (BLADE) ×2 IMPLANT
BLADE SURG 15 STRL SS (BLADE) ×6
BNDG CMPR 9X4 STRL LF SNTH (GAUZE/BANDAGES/DRESSINGS)
BNDG CMPR 9X6 STRL LF SNTH (GAUZE/BANDAGES/DRESSINGS) ×1
BNDG COHESIVE 4X5 TAN STRL (GAUZE/BANDAGES/DRESSINGS) ×3 IMPLANT
BNDG COHESIVE 6X5 TAN STRL LF (GAUZE/BANDAGES/DRESSINGS) IMPLANT
BNDG CONFORM 2 STRL LF (GAUZE/BANDAGES/DRESSINGS) IMPLANT
BNDG CONFORM 3 STRL LF (GAUZE/BANDAGES/DRESSINGS) ×3 IMPLANT
BNDG ESMARK 4X9 LF (GAUZE/BANDAGES/DRESSINGS) IMPLANT
BNDG ESMARK 6X9 LF (GAUZE/BANDAGES/DRESSINGS) ×3
CAP PIN PROTECTOR ORTHO WHT (CAP) IMPLANT
CHLORAPREP W/TINT 26ML (MISCELLANEOUS) ×3 IMPLANT
COVER BACK TABLE 60X90IN (DRAPES) ×3 IMPLANT
CUFF TOURNIQUET SINGLE 24IN (TOURNIQUET CUFF) ×2 IMPLANT
CUFF TOURNIQUET SINGLE 34IN LL (TOURNIQUET CUFF) IMPLANT
CUFF TOURNIQUET SINGLE 44IN (TOURNIQUET CUFF) IMPLANT
DECANTER SPIKE VIAL GLASS SM (MISCELLANEOUS) IMPLANT
DRAPE EXTREMITY T 121X128X90 (DRAPE) ×3 IMPLANT
DRAPE OEC MINIVIEW 54X84 (DRAPES) ×3 IMPLANT
DRAPE U-SHAPE 47X51 STRL (DRAPES) ×3 IMPLANT
DRSG MEPITEL 4X7.2 (GAUZE/BANDAGES/DRESSINGS) ×3 IMPLANT
DRSG PAD ABDOMINAL 8X10 ST (GAUZE/BANDAGES/DRESSINGS) ×3 IMPLANT
ELECT REM PT RETURN 9FT ADLT (ELECTROSURGICAL) ×3
ELECTRODE REM PT RTRN 9FT ADLT (ELECTROSURGICAL) ×1 IMPLANT
FUSION DEVICE 22.0 ACCTRAK (Screw) ×2 IMPLANT
GAUZE SPONGE 4X4 12PLY STRL (GAUZE/BANDAGES/DRESSINGS) ×3 IMPLANT
GLOVE BIO SURGEON STRL SZ8 (GLOVE) ×3 IMPLANT
GLOVE BIOGEL PI IND STRL 7.0 (GLOVE) IMPLANT
GLOVE BIOGEL PI IND STRL 8 (GLOVE) ×2 IMPLANT
GLOVE BIOGEL PI INDICATOR 7.0 (GLOVE) ×4
GLOVE BIOGEL PI INDICATOR 8 (GLOVE) ×4
GLOVE ECLIPSE 6.5 STRL STRAW (GLOVE) ×2 IMPLANT
GLOVE ECLIPSE 7.5 STRL STRAW (GLOVE) ×3 IMPLANT
GLOVE EXAM NITRILE MD LF STRL (GLOVE) IMPLANT
GOWN STRL REUS W/ TWL LRG LVL3 (GOWN DISPOSABLE) ×1 IMPLANT
GOWN STRL REUS W/ TWL XL LVL3 (GOWN DISPOSABLE) ×2 IMPLANT
GOWN STRL REUS W/TWL LRG LVL3 (GOWN DISPOSABLE) ×3
GOWN STRL REUS W/TWL XL LVL3 (GOWN DISPOSABLE) ×6
GUIDEWIRE ORTHO 062 (WIRE) ×2 IMPLANT
K-WIRE .054X4 (WIRE) IMPLANT
NEEDLE HYPO 22GX1.5 SAFETY (NEEDLE) IMPLANT
NS IRRIG 1000ML POUR BTL (IV SOLUTION) ×3 IMPLANT
PACK BASIN DAY SURGERY FS (CUSTOM PROCEDURE TRAY) ×3 IMPLANT
PAD CAST 4YDX4 CTTN HI CHSV (CAST SUPPLIES) ×1 IMPLANT
PADDING CAST ABS 4INX4YD NS (CAST SUPPLIES)
PADDING CAST ABS COTTON 4X4 ST (CAST SUPPLIES) IMPLANT
PADDING CAST COTTON 4X4 STRL (CAST SUPPLIES) ×3
PADDING CAST COTTON 6X4 STRL (CAST SUPPLIES) IMPLANT
PASSER SUT SWANSON 36MM LOOP (INSTRUMENTS) IMPLANT
PENCIL BUTTON HOLSTER BLD 10FT (ELECTRODE) ×3 IMPLANT
SANITIZER HAND PURELL 535ML FO (MISCELLANEOUS) ×3 IMPLANT
SCREW QUICK SNAP 2.0X12MM (Screw) ×2 IMPLANT
SHEET MEDIUM DRAPE 40X70 STRL (DRAPES) ×3 IMPLANT
SLEEVE SCD COMPRESS KNEE MED (MISCELLANEOUS) ×3 IMPLANT
SPLINT FAST PLASTER 5X30 (CAST SUPPLIES)
SPLINT PLASTER CAST FAST 5X30 (CAST SUPPLIES) IMPLANT
SPONGE LAP 18X18 X RAY DECT (DISPOSABLE) ×3 IMPLANT
STOCKINETTE 6  STRL (DRAPES) ×2
STOCKINETTE 6 STRL (DRAPES) ×1 IMPLANT
SUCTION FRAZIER HANDLE 10FR (MISCELLANEOUS) ×2
SUCTION TUBE FRAZIER 10FR DISP (MISCELLANEOUS) ×1 IMPLANT
SUT ETHILON 3 0 PS 1 (SUTURE) ×3 IMPLANT
SUT MNCRL AB 3-0 PS2 18 (SUTURE) ×3 IMPLANT
SUT VIC AB 0 SH 27 (SUTURE) IMPLANT
SUT VIC AB 2-0 SH 27 (SUTURE) ×3
SUT VIC AB 2-0 SH 27XBRD (SUTURE) IMPLANT
SYR BULB 3OZ (MISCELLANEOUS) ×3 IMPLANT
SYR CONTROL 10ML LL (SYRINGE) IMPLANT
TOWEL OR 17X24 6PK STRL BLUE (TOWEL DISPOSABLE) ×5 IMPLANT
TUBE CONNECTING 20'X1/4 (TUBING) ×1
TUBE CONNECTING 20X1/4 (TUBING) ×2 IMPLANT
UNDERPAD 30X30 (UNDERPADS AND DIAPERS) ×3 IMPLANT
YANKAUER SUCT BULB TIP NO VENT (SUCTIONS) IMPLANT

## 2015-12-25 NOTE — Op Note (Signed)
NAMECASSIE, Sanchez NO.:  [id number]  MEDICAL RECORD NO.:  [id number]  LOCATION:                                 FACILITY:  PHYSICIAN:  Toni Arthurs, MD        DATE OF BIRTH:  February 09, 1971  DATE OF PROCEDURE:  12/25/2015 DATE OF DISCHARGE:                              OPERATIVE REPORT   PREOPERATIVE DIAGNOSES: 1. Right third and fifth metatarsal metatarsalgia. 2. Right third hammertoe deformity. 3. Right fifth metatarsal painful hardware.  POSTOPERATIVE DIAGNOSES: 1. Right third and fifth metatarsal metatarsalgia. 2. Right third hammertoe deformity. 3. Right fifth metatarsal painful hardware.  PROCEDURE: 1. Right third hammertoe correction. 2. Right third metatarsal Weil osteotomy through a separate incision. 3. Right third MTP joint dorsal capsulotomy and extensor tendon     lengthening. 4. Right fifth metatarsal removal of deep implant through a separate     incision. 5. Right fifth metatarsal head exostectomy. 6. Right foot AP and lateral radiographs.  SURGEON:  Toni Arthurs, M.D.  ASSISTANT:  Alfredo Martinez, PA-C.  ANESTHESIA:  General, regional.  ESTIMATED BLOOD LOSS:  Minimal.  TOURNIQUET TIME:  36 minutes at 250 mmHg.  COMPLICATIONS:  Sanchez apparent.  DISPOSITION:  Extubated, awake, and stable to recovery.  INDICATIONS FOR PROCEDURE:  The patient is a 45 year old woman with history of right second hammertoe correction and fifth metatarsal bunionette correction.  She has painful hardware still in the fifth metatarsal as well as a plantar exostosis that is painful.  She also has a third hammertoe deformity that has progressed to a painful fixed deformity.  She presents today for operative treatment of these painful conditions having failed nonoperative treatment to date.  She understands the risks and benefits, the alternative treatment options, and elects surgical treatment.  She specifically understands risks of bleeding, infection, nerve  damage, blood clots, need for additional surgery, continued pain, recurrence of deformity, amputation, and death.  PROCEDURE IN DETAIL:  After preoperative consent was obtained and the correct operative site was identified, the patient was brought to the operating room and placed supine on the operating table.  General anesthesia was induced.  Preoperative antibiotics were administered. Surgical time-out was taken.  The right lower extremity was prepped and draped in standard sterile fashion with a tourniquet around the calf. The extremity was exsanguinated and tourniquet was inflated to 200 mmHg. A longitudinal incision was made at the previous operative site of the fifth metatarsal.  Sharp dissection was carried down through the skin and subcutaneous tissue.  The lateral joint capsule was incised and elevated plantarly and dorsally.  The screw head was identified.  It was cleared with rongeur of any overgrown bone and the screw was removed in its entirety.  Attention was then turned to the plantar aspect of the fifth metatarsal head.  The bony prominence was identified.  The oscillating saw was used to resect this prominence in line with the bone proximally.  Wound was irrigated and closed with Vicryl and nylon.  Attention was turned to the third MTP joint were a longitudinal incision was made.  Sharp dissection was carried down through the skin and subcutaneous tissue.  The extensor tendons  were identified and retracted.  The dorsal joint capsule was excised exposing the metatarsal head.  A Weil osteotomy was made with the oscillating saw removing a small wedge of bone.  The head of the metatarsal was allowed to retract several millimeters proximally and the osteotomy was fixed with a 2-mm Acumed Twist-Off screw.  The overhanging bone was trimmed with a rongeur.  Attention was then turned to the PIP joint.  The proximal incision was carried distally, and the extensor mechanism was  split longitudinally and elevated.  The head of the proximal phalanx and base of the middle phalanx were resected, and the joint was reduced and fixed with an Acumed Acutrak screw.  AP and lateral radiographs confirmed appropriate position of both screws and appropriate reduction of the hammertoe deformity.  Toes still had a bit of tendency to dorsiflex, so the extensor tendons were lengthened in Z-fashion and repaired with Monocryl.  Wound was irrigated.  Vancomycin powder was placed in the wound and it was closed with Monocryl and nylon.  Sterile dressings were applied followed by compression wrap.  Tourniquet was released at 46 minutes.  The patient was awakened from anesthesia and transported to the recovery room in stable condition.  FOLLOWUP PLAN:  The patient will bear weight on her right foot in a flat postop shoe.  She will follow up with me in the office in 2 weeks for suture removal and dressing change.  RADIOGRAPHS:  AP and lateral radiographs of the right foot were obtained intraoperatively.  These show interval correction of third hammertoe deformity with a Weil osteotomy as well as exostectomy with hardware removal of the fifth metatarsal.  No acute injuries are noted.  Hardware is remaining in second ray and is appropriately positioned.  Alfredo Martinez, PA-C, was present and scrubbed for the duration of the case.  His assistance was essential in positioning the patient, prepping and draping, gaining and maintaining exposure, performing the operation, closing and dressing the wounds, and applying the splint.     Toni Arthurs, MD     JH/MEDQ  D:  12/25/2015  T:  12/25/2015  Job:  [id number]

## 2015-12-25 NOTE — Transfer of Care (Signed)
Immediate Anesthesia Transfer of Care Note  Patient: Lindsay Sanchez  Procedure(s) Performed: Procedure(s): RIGHT THIRD HAMMERTOE CORRECTION, WEIL OSTEOTOMY (Right) RIGHT FIFTH METATARSAL DEEP IMPLANT REMOVAL, EXOSTECTOMY (Right)  Patient Location: PACU  Anesthesia Type:GA combined with regional for post-op pain  Level of Consciousness: awake, alert , oriented and patient cooperative  Airway & Oxygen Therapy: Patient Spontanous Breathing and Patient connected to face mask oxygen  Post-op Assessment: Report given to RN and Post -op Vital signs reviewed and stable  Post vital signs: Reviewed and stable  Last Vitals:  Filed Vitals:   12/25/15 1050 12/25/15 1303  BP: 118/67 146/96  Pulse: 80   Temp:    Resp: 16     Complications: No apparent anesthesia complications

## 2015-12-25 NOTE — Anesthesia Postprocedure Evaluation (Signed)
Anesthesia Post Note  Patient: Lindsay Sanchez  Procedure(s) Performed: Procedure(s) (LRB): RIGHT THIRD HAMMERTOE CORRECTION, WEIL OSTEOTOMY (Right) RIGHT FIFTH METATARSAL DEEP IMPLANT REMOVAL, EXOSTECTOMY (Right)  Patient location during evaluation: PACU Anesthesia Type: General and Regional Level of consciousness: awake and alert Pain management: pain level controlled Vital Signs Assessment: post-procedure vital signs reviewed and stable Respiratory status: spontaneous breathing Cardiovascular status: blood pressure returned to baseline Anesthetic complications: no    Last Vitals:  Filed Vitals:   12/25/15 1430 12/25/15 1454  BP: 139/94 146/82  Pulse: 74 76  Temp:  36.4 C  Resp:  16    Last Pain:  Filed Vitals:   12/25/15 1456  PainSc: 8                  Kennieth Rad

## 2015-12-25 NOTE — Progress Notes (Signed)
Assisted Dr. Rob Fitzgerald with right, ultrasound guided, popliteal block. Side rails up, monitors on throughout procedure. See vital signs in flow sheet. Tolerated Procedure well. 

## 2015-12-25 NOTE — Discharge Instructions (Addendum)
Toni Arthurs, MD [facility]  Please read the following information regarding your care after surgery.  Medications  You only need a prescription for the narcotic pain medicine (ex. oxycodone, Percocet, Norco).  All of the other medicines listed below are available over the counter. X acetominophen (Tylenol) 650 mg every 4-6 hours as you need for minor pain X oxycodone as prescribed for moderate to severe pain ?   Narcotic pain medicine (ex. oxycodone, Percocet, Vicodin) will cause constipation.  To prevent this problem, take the following medicines while you are taking any pain medicine. X docusate sodium (Colace) 100 mg twice a day X senna (Senokot) 2 tablets twice a day  Weight Bearing X Bear weight when you are able on your operated leg or foot in the flat post-op shoe.  Cast / Splint / Dressing X Keep your dressing clean and dry.  Dont put anything (coat hanger, pencil, etc) down inside of it.  If it gets damp, use a hair dryer on the cool setting to dry it.  If it gets soaked, call the office to schedule an appointment for a cast change.  After your dressing, cast or splint is removed; you may shower, but do not soak or scrub the wound.  Allow the water to run over it, and then gently pat it dry.  Swelling It is normal for you to have swelling where you had surgery.  To reduce swelling and pain, keep your toes above your nose for at least 3 days after surgery.  It may be necessary to keep your foot or leg elevated for several weeks.  If it hurts, it should be elevated.  Follow Up Call my office at [phone] when you are discharged from the hospital or surgery center to schedule an appointment to be seen two weeks after surgery.  Call my office at [phone] if you develop a fever >101.5 F, nausea, vomiting, bleeding from the surgical site or severe pain.      Regional Anesthesia Blocks  1. Numbness or the inability to move the "blocked" extremity may last  from 3-48 hours after placement. The length of time depends on the medication injected and your individual response to the medication. If the numbness is not going away after 48 hours, call your surgeon.  2. The extremity that is blocked will need to be protected until the numbness is gone and the  Strength has returned. Because you cannot feel it, you will need to take extra care to avoid injury. Because it may be weak, you may have difficulty moving it or using it. You may not know what position it is in without looking at it while the block is in effect.  3. For blocks in the legs and feet, returning to weight bearing and walking needs to be done carefully. You will need to wait until the numbness is entirely gone and the strength has returned. You should be able to move your leg and foot normally before you try and bear weight or walk. You will need someone to be with you when you first try to ensure you do not fall and possibly risk injury.  4. Bruising and tenderness at the needle site are common side effects and will resolve in a few days.  5. Persistent numbness or new problems with movement should be communicated to the surgeon or the [facility] [phone] [facility] [phone]).      Post Anesthesia Home Care Instructions  Activity: Get plenty  of rest for the remainder of the day. A responsible adult should stay with you for 24 hours following the procedure.  For the next 24 hours, DO NOT: -Drive a car -Paediatric nurse -Drink alcoholic beverages -Take any medication unless instructed by your physician -Make any legal decisions or sign important papers.  Meals: Start with liquid foods such as gelatin or soup. Progress to regular foods as tolerated. Avoid greasy, spicy, heavy foods. If nausea and/or vomiting occur, drink only clear liquids until the nausea and/or vomiting subsides. Call your physician if vomiting continues.  Special  Instructions/Symptoms: Your throat may feel dry or sore from the anesthesia or the breathing tube placed in your throat during surgery. If this causes discomfort, gargle with warm salt water. The discomfort should disappear within 24 hours.  If you had a scopolamine patch placed behind your ear for the management of post- operative nausea and/or vomiting:  1. The medication in the patch is effective for 72 hours, after which it should be removed.  Wrap patch in a tissue and discard in the trash. Wash hands thoroughly with soap and water. 2. You may remove the patch earlier than 72 hours if you experience unpleasant side effects which may include dry mouth, dizziness or visual disturbances. 3. Avoid touching the patch. Wash your hands with soap and water after contact with the patch.

## 2015-12-25 NOTE — Anesthesia Preprocedure Evaluation (Addendum)
Anesthesia Evaluation  Patient identified by MRN, date of birth, ID band Patient awake    Reviewed: Allergy & Precautions, NPO status , Patient's Chart, lab work & pertinent test results  Airway Mallampati: II  TM Distance: >3 FB Neck ROM: Full    Dental   Pulmonary asthma ,    breath sounds clear to auscultation       Cardiovascular negative cardio ROS   Rhythm:Regular Rate:Normal     Neuro/Psych PSYCHIATRIC DISORDERS Depression  Neuromuscular disease    GI/Hepatic negative GI ROS, Neg liver ROS,   Endo/Other  diabetes, Type 2, Oral Hypoglycemic AgentsMorbid obesity  Renal/GU negative Renal ROS     Musculoskeletal   Abdominal   Peds  Hematology negative hematology ROS (+)   Anesthesia Other Findings   Reproductive/Obstetrics                           Anesthesia Physical Anesthesia Plan  ASA: III  Anesthesia Plan: General and Regional   Post-op Pain Management: GA combined w/ Regional for post-op pain   Induction: Intravenous  Airway Management Planned: LMA  Additional Equipment:   Intra-op Plan:   Post-operative Plan: Extubation in OR  Informed Consent: I have reviewed the patients History and Physical, chart, labs and discussed the procedure including the risks, benefits and alternatives for the proposed anesthesia with the patient or authorized representative who has indicated his/her understanding and acceptance.   Dental advisory given  Plan Discussed with:   Anesthesia Plan Comments:        Anesthesia Quick Evaluation

## 2015-12-25 NOTE — Anesthesia Procedure Notes (Addendum)
Anesthesia Regional Block:  Popliteal block  Pre-Anesthetic Checklist: ,, timeout performed, Correct Patient, Correct Site, Correct Laterality, Correct Procedure, Correct Position, site marked, Risks and benefits discussed,  Surgical consent,  Pre-op evaluation,  At surgeon's request and post-op pain management  Laterality: Right  Prep: chloraprep       Needles:  Injection technique: Single-shot  Needle Type: Echogenic Stimulator Needle     Needle Length: 9cm 9 cm Needle Gauge: 21 and 21 G    Additional Needles:  Procedures: ultrasound guided (picture in chart) and nerve stimulator Popliteal block  Nerve Stimulator or Paresthesia:  Response: tibial, 0.5 mA,  Response: peroneal, 0.5 mA,   Additional Responses:   Narrative:  Start time: 12/25/2015 10:45 AM End time: 12/25/2015 10:52 AM Injection made incrementally with aspirations every 5 mL.  Performed by: Personally  Anesthesiologist: Marcene Duos   Procedure Name: LMA Insertion Date/Time: 12/25/2015 12:08 PM Performed by: Neyda Durango D Pre-anesthesia Checklist: Patient identified, Emergency Drugs available, Suction available and Patient being monitored Patient Re-evaluated:Patient Re-evaluated prior to inductionOxygen Delivery Method: Circle System Utilized Preoxygenation: Pre-oxygenation with 100% oxygen Intubation Type: IV induction Ventilation: Mask ventilation without difficulty LMA: LMA inserted LMA Size: 4.0 Number of attempts: 1 Airway Equipment and Method: Bite block Placement Confirmation: positive ETCO2 Tube secured with: Tape Dental Injury: Teeth and Oropharynx as per pre-operative assessment

## 2015-12-25 NOTE — H&P (Signed)
Lindsay Sanchez is an 45 y.o. female.   Chief Complaint: right foot pain HPI: 45 y/o female with painful right 3rd hammertoe and 5th MT extostosis after bunionette correction.  She presents now for correction of her 3rd metatarsalgia nd hammertoe and removal of hardware and exostectomy at the 5th MT.  Past Medical History  Diagnosis Date  . Diabetes (HCC)   . Urinary frequency   . Chest tightness   . Asthma   . Anxiety     anxiety attacks are infrequent  . Obesity 09/27/2012  . Meralgia paresthetica of left side 12/12/2015    Past Surgical History  Procedure Laterality Date  . Neck surgery    . Cesarean section    . Hand surgery    . Tonsillectomy    . Abdominal hysterectomy    . Foot surgery      Family History  Problem Relation Age of Onset  . Arrhythmia Mother     V TACH  . Breast cancer Mother 30    recurrance at 103  . Ovarian cancer Mother   . Heart attack Father   . Heart attack Maternal Grandfather   . Cancer Maternal Grandfather   . Heart disease Paternal Grandmother   . Breast cancer Maternal Aunt   . Breast cancer Maternal Aunt   . Breast cancer Cousin     maternal first cousin   Social History:  reports that she has never smoked. She has never used smokeless tobacco. She reports that she drinks alcohol. She reports that she does not use illicit drugs.  Allergies:  Allergies  Allergen Reactions  . Adhesive [Tape] Rash    And itching (when left on for extended time)    Medications Prior to Admission  Medication Sig Dispense Refill  . metFORMIN (GLUMETZA) 1000 MG (MOD) 24 hr tablet Take 1,000 mg by mouth 2 (two) times daily with a meal.    . Multiple Vitamin (MULTIVITAMIN) tablet Take 1 tablet by mouth daily.    Marland Kitchen UNABLE TO FIND Take 1 tablet by mouth daily. Med Name: Kirk Ruths      Results for orders placed or performed during the hospital encounter of 12-26-15 (from the past 48 hour(s))  Glucose, capillary     Status: Abnormal   Collection Time:  12/26/15 10:19 AM  Result Value Ref Range   Glucose-Capillary 159 (H) 65 - 99 mg/dL   No results found.  ROS  No recent f/c/n/v/wt loss  Blood pressure 118/67, pulse 80, temperature 97.8 F (36.6 C), temperature source Oral, resp. rate 16, height  (1.702 m), weight 136.76 kg (301 lb 8 oz), SpO2 100 %. Physical Exam  wn wd woman in nad.  A and o x 4.  Mood and affect normal.  EOMi.  resp ulabored.  R foot with heatlhy skin.  No lymphadenopathy.  5.5 strength in PF and DF.  Sens to LT intact.  TTP at 5th MT head.  3rd hammertoe present.   Assessment/Plan R 3rd hammertoe, metatarsalgia and 5th MT painful hardware and extosis - to OR for removal of deep implants and extostectomy and 3rd hammertoe correction.  The risks and benefits of the alternative treatment options have been discussed in detail.  The patient wishes to proceed with surgery and specifically understands risks of bleeding, infection, nerve damage, blood clots, need for additional surgery, amputation and death.   Toni Arthurs, MD 2015-12-26, 11:42 AM

## 2015-12-25 NOTE — Brief Op Note (Signed)
12/25/2015  1:18 PM  PATIENT:  Lindsay Sanchez  45 y.o. female  PRE-OPERATIVE DIAGNOSIS:  1.  Right 3rd and 5th metatarsalgia     2.  Right 3rd hammertoe     3.  Right 5th MT painful hardware       POST-OPERATIVE DIAGNOSIS:  Same  Procedure(s): 1.  Right 3rd hammertoe correction 2.  Right 3rd MT Weil osteotomy 3.  Right 3rd MTPJ dorsal capsulotomy and extensor tendon lengthening 4.  Right 5th MT removal of deep implant 5.  Right 5th MT exostectomy 6.  Right foot AP and lateral xrays  SURGEON:  Toni Arthurs, MD  ASSISTANT: Alfredo Martinez, PA-C  ANESTHESIA:   General, regional  EBL:  minimal   TOURNIQUET:   Total Tourniquet Time Documented: Calf (Right) - 36 minutes Total: Calf (Right) - 36 minutes  COMPLICATIONS:  None apparent  DISPOSITION:  Extubated, awake and stable to recovery.  DICTATION ID:  [id number]

## 2015-12-26 ENCOUNTER — Encounter (HOSPITAL_BASED_OUTPATIENT_CLINIC_OR_DEPARTMENT_OTHER): Payer: Self-pay | Admitting: Orthopedic Surgery

## 2016-01-14 ENCOUNTER — Telehealth: Payer: Self-pay | Admitting: Cardiology

## 2016-01-14 NOTE — Telephone Encounter (Signed)
Received records from [facility] for appointment with Dr [place] on 03/08/16 .  Records given to [facility] (medical records) for Dr Elvis Coil schedule on 03/08/16. lp

## 2016-01-15 ENCOUNTER — Other Ambulatory Visit (HOSPITAL_COMMUNITY): Payer: Self-pay | Admitting: General Surgery

## 2016-02-16 ENCOUNTER — Encounter: Payer: Self-pay | Admitting: Dietician

## 2016-02-16 ENCOUNTER — Encounter: Payer: No Typology Code available for payment source | Attending: General Surgery | Admitting: Dietician

## 2016-02-16 NOTE — Progress Notes (Signed)
  Pre-Op Assessment Visit:  Pre-Operative RYGB Surgery  Medical Nutrition Therapy:  Appt start time: 1410   End time:  1450.  Patient was seen on 02/16/2016 for Pre-Operative Nutrition Assessment. [facility] on 02/16/2016.   Preferred Learning Style:   No preference indicated   Learning Readiness:   Ready  Handouts given during visit include:  Pre-Op Goals Bariatric Surgery Protein Shakes   During the appointment today the following Pre-Op Goals were reviewed with the patient: Maintain or lose weight as instructed by your surgeon Make healthy food choices Begin to limit portion sizes Limited concentrated sugars and fried foods Keep fat/sugar in the single digits per serving on   food labels Practice CHEWING your food  (aim for 30 chews per bite or until applesauce consistency) Practice not drinking 15 minutes before, during, and 30 minutes after each meal/snack Avoid all carbonated beverages  Avoid/limit caffeinated beverages  Avoid all sugar-sweetened beverages Consume 3 meals per day; eat every 3-5 hours Make a list of non-food related activities Aim for 64-100 ounces of FLUID daily  Aim for at least 60-80 grams of PROTEIN daily Look for a liquid protein source that contain ?15 g protein and ?5 g carbohydrate  (ex: shakes, drinks, shots)  Patient-Centered Goals: Wants to "get rid of diabetes" and stop taking medication  10 confidence/10 importance scale  Demonstrated degree of understanding via:  Teach Back  Teaching Method Utilized:  Visual Auditory Hands on  Barriers to learning/adherence to lifestyle change: none  Patient to call the [facility] to enroll in Pre-Op and Post-Op Nutrition Education when surgery date is scheduled.

## 2016-02-16 NOTE — Patient Instructions (Signed)
Follow Pre-Op Goals Try Protein Shakes Call [facility] at [phone] when surgery is scheduled to enroll in Pre-Op Class  Things to remember:  Please always be honest with us. We want to support you!  If you have any questions or concerns in between appointments, please call or email Liz, Leslie, or Laurie.  The diet after surgery will be high protein and low in carbohydrate.  Vitamins and calcium need to be taken for the rest of your life.  Feel free to include support people in any classes or appointments.   Supplement recommendations:  Complete" Multivitamin: Sleeve Gastrectomy and RYGB patients take a double dose of MVI. LAGB patients take single dose as it is written on the package. Vitamin must be liquid or chewable but not gummy. Examples of these include Flintstones Complete and Centrum Complete. If the vitamin is bariatric-specific, take 1 dose as it is already formulated for bariatric surgery patients. Examples of these are Bariatric Advantage, Celebrate, and Wellesse. These can be found at the [facility] and/or online.     Calcium citrate: 1500 mg/day of Calcium citrate (also chewable or liquid) is recommended for all procedures. The body is only able to absorb 500-600 mg of Calcium at one time so 3 daily doses of 500 mg are recommended. Calcium doses must be taken a minimum of 2 hours apart. Additionally, Calcium must be taken 2 hours apart from iron-containing MVI. Examples of brands include Celebrate, Bariatric Advantage, and Wellesse. These brands must be purchased online or at the [facility]. Citracal Petites is the only Calcium citrate supplement found in general grocery stores and pharmacies. This is in tablet form and may be recommended for patients who do not tolerate chewable Calcium.  Continued or added Vitamin D supplementation based on individual needs.    Vitamin B12: 300-500 mcg/day for Sleeve Gastrectomy and RYGB. Optional for  LAGB patients as stomach remains fully intact. Must be taken intramuscularly, sublingually, or inhaled nasally. Oral route is not recommended. 

## 2016-03-02 ENCOUNTER — Ambulatory Visit: Payer: No Typology Code available for payment source | Admitting: Cardiology

## 2016-03-08 ENCOUNTER — Ambulatory Visit (INDEPENDENT_AMBULATORY_CARE_PROVIDER_SITE_OTHER): Payer: No Typology Code available for payment source | Admitting: Cardiology

## 2016-03-08 ENCOUNTER — Ambulatory Visit (HOSPITAL_COMMUNITY)
Admission: RE | Admit: 2016-03-08 | Discharge: 2016-03-08 | Disposition: A | Discharge status: Home / Self Care | Payer: No Typology Code available for payment source | Source: Ambulatory Visit | Attending: General Surgery | Admitting: General Surgery

## 2016-03-08 ENCOUNTER — Encounter: Payer: Self-pay | Admitting: Cardiology

## 2016-03-08 ENCOUNTER — Other Ambulatory Visit (HOSPITAL_COMMUNITY): Payer: Self-pay | Admitting: General Surgery

## 2016-03-08 VITALS — BP 136/84 | HR 74 | Ht 67.0 in | Wt 304.6 lb

## 2016-03-08 DIAGNOSIS — Z6841 Body Mass Index (BMI) 40.0 and over, adult: Secondary | ICD-10-CM | POA: Diagnosis not present

## 2016-03-08 DIAGNOSIS — E119 Type 2 diabetes mellitus without complications: Secondary | ICD-10-CM

## 2016-03-08 DIAGNOSIS — Z8249 Family history of ischemic heart disease and other diseases of the circulatory system: Secondary | ICD-10-CM

## 2016-03-08 NOTE — Patient Instructions (Signed)
We will clear you for bariatric surgery

## 2016-03-08 NOTE — Progress Notes (Signed)
Cardiology Office Note   Date:  03/08/2016   ID:  Lindsay Sanchez, DOB 02/06/1971, MRN [id number]  PCP:  Joycelyn RuaMEYERS, STEPHEN, MD  Cardiologist:   Peter SwazilandJordan, MD   Chief Complaint  Patient presents with  . Medical Clearance    pt having gastric bypass surgery      History of Present Illness: Lindsay Sanchez is a 45 y.o. female who presents for preoperative cardiac assessment for planned gastric bypass with Roux en Y by Dr Gaynelle AduEric Wilson. She has a history of DM type 2, morbid obesity, and family history of CAD. Father died at age 47 with MI and 2 cousins died at 1446 and 7151 respectively. She denies any symptoms of chest pain, dyspnea, or palpitations except when she has asthma attacks. She was evaluated with a stress Echo in 2013 that was normal. She did have extensive foot surgery in February without complications from anesthesia. Prior to this she went to the gym 3 x / week working out with treadmill, bike, and elliptical without problems.     Past Medical History  Diagnosis Date  . Diabetes (HCC)   . Urinary frequency   . Chest tightness   . Asthma   . Anxiety     anxiety attacks are infrequent  . Obesity 09/27/2012  . Meralgia paresthetica of left side 12/12/2015    Past Surgical History  Procedure Laterality Date  . Neck surgery    . Cesarean section    . Hand surgery    . Tonsillectomy    . Abdominal hysterectomy    . Foot surgery    . Hammertoe reconstruction with weil osteotomy Right 12/25/2015    Procedure: RIGHT THIRD HAMMERTOE CORRECTION, WEIL OSTEOTOMY;  Surgeon: Toni ArthursJohn Hewitt, MD;  Location: [facility];  Service: Orthopedics;  Laterality: Right;  . Hardware removal Right 12/25/2015    Procedure: RIGHT FIFTH METATARSAL DEEP IMPLANT REMOVAL, EXOSTECTOMY;  Surgeon: Toni ArthursJohn Hewitt, MD;  Location: [facility];  Service: Orthopedics;  Laterality: Right;     Current Outpatient Prescriptions  Medication Sig Dispense Refill  . cholecalciferol  (VITAMIN D) 1000 units tablet Take 1,000 Units by mouth daily.    . metFORMIN (GLUMETZA) 1000 MG (MOD) 24 hr tablet Take 1,000 mg by mouth 2 (two) times daily with a meal.    . Multiple Vitamin (MULTIVITAMIN) tablet Take 1 tablet by mouth daily.    . vitamin C (ASCORBIC ACID) 500 MG tablet Take 500 mg by mouth daily.     No current facility-administered medications for this visit.    Allergies:   Adhesive    Social History:  The patient  reports that she has never smoked. She has never used smokeless tobacco. She reports that she drinks alcohol. She reports that she does not use illicit drugs.   Family History:  The patient's family history includes Arrhythmia in her mother; Breast cancer in her cousin, maternal aunt, and maternal aunt; Breast cancer (age of onset: 4651) in her mother; Cancer in her maternal grandfather; Heart attack in her maternal grandfather; Heart attack (age of onset: 3549) in her father; Heart disease in her paternal grandmother; Ovarian cancer in her mother.    ROS:  Please see the history of present illness.  She does have seasonal asthma/allergies. Otherwise, review of systems are positive for none.   All other systems are reviewed and negative.    PHYSICAL EXAM: VS:  BP 136/84 mmHg  Pulse 74  Ht 5\' 7"  (  1.702 m)  Wt 138.166 kg (304 lb 9.6 oz)  BMI 47.70 kg/m2 , BMI Body mass index is 47.7 kg/(m^2). GEN: Well nourished, obese, in no acute distress HEENT: normal Neck: no JVD, carotid bruits, or masses Cardiac: RRR; no murmurs, rubs, or gallops,no edema  Respiratory:  clear to auscultation bilaterally, normal work of breathing GI: soft, nontender, nondistended, + BS MS: no deformity or atrophy Skin: warm and dry, no rash Neuro:  Strength and sensation are intact Psych: euthymic mood, full affect   EKG:  EKG is ordered today. The ekg ordered today demonstrates NSR with rate 74. Normal Ecg. I have personally reviewed and interpreted this study.    Recent  Labs: 12/19/2015: BUN 10; Creatinine, Ser 0.76; Potassium 4.7; Sodium 140    Lipid Panel No results found for: CHOL, TRIG, HDL, CHOLHDL, VLDL, LDLCALC, LDLDIRECT    Wt Readings from Last 3 Encounters:  03/08/16 138.166 kg (304 lb 9.6 oz)  02/16/16 136.941 kg (301 lb 14.4 oz)  12/25/15 136.76 kg (301 lb 8 oz)      Other studies Reviewed: Additional studies/ records that were reviewed today include: Surgery office notes. Review of the above records demonstrates: as noted   ASSESSMENT AND PLAN:  1.  Morbid obesity. From a cardiac standpoint I think she is low risk for planned gastric bypass surgery. She has no active cardiac symptoms. Ecg and exam are normal. Stress Echo in 2013 was normal. Recent anesthesia without problems.  2. DM type 2, 3. Family history of early CAD.   Current medicines are reviewed at length with the patient today.  The patient does not have concerns regarding medicines.  The following changes have been made:  no change  Labs/ tests ordered today include:  Orders Placed This Encounter  Procedures  . EKG 12-Lead     Disposition:   FU prn  Signed, Peter Swaziland, MD  03/08/2016 12:42 PM    [facility] [address] [place], [place], [address] Phone [phone], Fax [phone]

## 2017-01-04 ENCOUNTER — Telehealth: Payer: Self-pay | Admitting: Cardiovascular Disease

## 2017-01-04 NOTE — Telephone Encounter (Signed)
Received records from [facility] for appointment on 01/07/17 with Dr Allyson SabalBerry.  Records put with Dr Hazle CocaBerry's schedule for 01/07/17. lp

## 2017-01-07 ENCOUNTER — Ambulatory Visit: Payer: No Typology Code available for payment source | Admitting: Cardiovascular Disease

## 2017-01-07 ENCOUNTER — Encounter: Payer: Self-pay | Admitting: Physician Assistant

## 2017-01-07 ENCOUNTER — Ambulatory Visit (INDEPENDENT_AMBULATORY_CARE_PROVIDER_SITE_OTHER): Payer: No Typology Code available for payment source | Admitting: Physician Assistant

## 2017-01-07 VITALS — BP 124/82 | HR 87 | Ht 67.0 in | Wt 290.0 lb

## 2017-01-07 DIAGNOSIS — E785 Hyperlipidemia, unspecified: Secondary | ICD-10-CM

## 2017-01-07 DIAGNOSIS — R079 Chest pain, unspecified: Secondary | ICD-10-CM

## 2017-01-07 DIAGNOSIS — E118 Type 2 diabetes mellitus with unspecified complications: Secondary | ICD-10-CM | POA: Diagnosis not present

## 2017-01-07 DIAGNOSIS — M79604 Pain in right leg: Secondary | ICD-10-CM

## 2017-01-07 DIAGNOSIS — M79605 Pain in left leg: Secondary | ICD-10-CM | POA: Diagnosis not present

## 2017-01-07 MED ORDER — ASPIRIN EC 81 MG PO TBEC: 81.0000 mg | DELAYED_RELEASE_TABLET | Freq: Every day | ORAL | 3 refills | Status: DC

## 2017-01-07 NOTE — Patient Instructions (Addendum)
Medication Instructions:  START taking aspirin 81 mg-one time daily.  This may be purchased over the counter.  Labwork: NONE  Testing/Procedures: Your physician has requested that you have a 2 day exercise stress myoview. For further information please visit [email or web address] Please follow instruction sheet, as given.  Your physician has requested that you have an ankle brachial index (ABI). During this test an ultrasound and blood pressure cuff are used to evaluate the arteries that supply the arms and legs with blood. Allow thirty minutes for this exam. There are no restrictions or special instructions.   Follow-Up: If stress test is normal-follow up with Dr. SwazilandJordan in 6 months. If abnormal-we will call you with results and will schedule follow up appointment at that time.    Any Other Special Instructions Will Be Listed Below (If Applicable).  We suggest you have a lipid panel in 2 months with your PCP and have results faxed to our office at [phone]   If you need a refill on your cardiac medications before your next appointment, please call your pharmacy.

## 2017-01-07 NOTE — Progress Notes (Signed)
Cardiology Office Note    Date:  01/07/2017   ID:  Lindsay AnesJeanette V Stamas, DOB 02/02/1971, MRN [id number]  PCP:  Joycelyn RuaMEYERS, STEPHEN, MD  Cardiologist:  Dr. SwazilandJordan  Chief Complaint  Patient presents with  . Follow-up    seen for Dr. SwazilandJordan, episodic chest pain and leg pain    History of Present Illness:  Lindsay Sanchez is a 46 y.o. female with PMH of DM II and morbid obesity. She has significant family history of CAD with her father died at age 48 with MI and to cousin died at age 48 and a 3951 respectively. She also had a stress echo in 2013 that was normal. She was last seen by Dr. SwazilandJordan on 03/08/2016, at which time she was doing well and going to gym 3 times a week. She was under consideration for Roux-en-Y surgery by Dr. Gaynelle AduEric Wilson, she was cleared for surgical procedure without additional workup. This was never done due to insurance reasons.  It appears patient was originally scheduled to see Dr. Allyson SabalBerry today as a new patient, however this was before it was realized Dr. SwazilandJordan is her primary cardiologist. Since her last office visit with Dr. SwazilandJordan, unfortunately she has lost another 2 brothers to heart attack. This has caused significant amount of stress as she has lost multiple family members from her father's side due to heart attacks at very young age. She is concerned about her kids if something were to happen to her. In the last 6 months, she has had at least 3 episodes of chest pain, none of which lasted very long. The last episode of chest pain was 3 weeks ago, she says she was driving at the time. She described it as a pressure-like sensation in the substernal area. It lasted roughly 4 minutes before going away. EKG today showed normal sinus rhythm without significant ST-T wave changes, it does have 1 mm Q wave in the inferior lead, however my suspicion of inferior MI in the past is quite low. Her symptoms sounds atypical, I recommended a treadmill nuclear stress test. She also mentions she  has worsening burning sensation in her leg as well especially noticeable with ambulation. Sometimes she had to stop after walking a certain distance. Of course, her  morbid obesity and knee problem likely contributed partially to the symptom. She also mentions she has a constant tingling sensation in the left thigh which is worse when she is laying on that side. I am more concerned about the exertional burning sensation in the thigh we will obtain ABI given her family history. We have also reviewed her labs from the PCPs office. She recently had a hemoglobin A1c of 6.2 on January 25 indicative of good glucose control. Her last lipid panel was obtained on 06/18/2016 which showed cholesterol 220, HDL 63, triglyceride 174, LDL 122. Since then she has cut back on bread and is trying increased activity level. I recommended a 2 month fasting lipid test and have the results faxed to cardiology office. Then we'll do decide whether or not she should start on statin therapy. If the treadmill nuclear stress test is negative, she can follow-up with Dr. SwazilandJordan in 6 months. If it is positive, we will see her earlier. She should also start on aspirin 81 mg daily.   Past Medical History:  Diagnosis Date  . Anxiety    anxiety attacks are infrequent  . Asthma   . Chest tightness   . Diabetes (HCC)   .  Meralgia paresthetica of left side 12/12/2015  . Obesity 09/27/2012  . Urinary frequency     Past Surgical History:  Procedure Laterality Date  . ABDOMINAL HYSTERECTOMY    . CESAREAN SECTION    . FOOT SURGERY    . HAMMERTOE RECONSTRUCTION WITH WEIL OSTEOTOMY Right 12/25/2015   Procedure: RIGHT THIRD HAMMERTOE CORRECTION, WEIL OSTEOTOMY;  Surgeon: Toni Arthurs, MD;  Location: [facility];  Service: Orthopedics;  Laterality: Right;  . HAND SURGERY    . HARDWARE REMOVAL Right 12/25/2015   Procedure: RIGHT FIFTH METATARSAL DEEP IMPLANT REMOVAL, EXOSTECTOMY;  Surgeon: Toni Arthurs, MD;  Location: [facility];  Service: Orthopedics;  Laterality: Right;  . NECK SURGERY    . TONSILLECTOMY      Current Medications: Outpatient Medications Prior to Visit  Medication Sig Dispense Refill  . cholecalciferol (VITAMIN D) 1000 units tablet Take 1,000 Units by mouth daily.    . metFORMIN (GLUMETZA) 1000 MG (MOD) 24 hr tablet Take 1,000 mg by mouth 2 (two) times daily with a meal.    . Multiple Vitamin (MULTIVITAMIN) tablet Take 1 tablet by mouth daily.    . vitamin C (ASCORBIC ACID) 500 MG tablet Take 500 mg by mouth daily.     No facility-administered medications prior to visit.      Allergies:   Adhesive [tape]   Social History   Social History  . Marital status: Married    Spouse name: N/A  . Number of children: 2  . Years of education: 14   Occupational History  . pet groomer    Social History Main Topics  . Smoking status: Never Smoker  . Smokeless tobacco: Never Used  . Alcohol use Yes  . Drug use: No  . Sexual activity: Not Asked   Other Topics Concern  . None   Social History Narrative   Patient drinks about 1 cup of caffeine daily.   Patient is right handed.      Family History:  The patient's family history includes Arrhythmia in her mother; Breast cancer in her cousin, maternal aunt, and maternal aunt; Breast cancer (age of onset: 72) in her mother; Cancer in her maternal grandfather; Heart attack in her brother and maternal grandfather; Heart attack (age of onset: 37) in her father; Heart disease in her paternal grandmother; Ovarian cancer in her mother; Sudden Cardiac Death in her brother.   ROS:   Please see the history of present illness.    ROS All other systems reviewed and are negative.   PHYSICAL EXAM:   VS:  BP 124/82   Pulse 87   Ht 5\' 7"  (1.702 m)   Wt 290 lb (131.5 kg)   SpO2 98%   BMI 45.42 kg/m    GEN: Well nourished, well developed, in no acute distress  HEENT: normal  Neck: no JVD, carotid bruits, or masses Cardiac: RRR; no  murmurs, rubs, or gallops,no edema  Respiratory:  clear to auscultation bilaterally, normal work of breathing GI: soft, nontender, nondistended, + BS MS: no deformity or atrophy  Skin: warm and dry, no rash Neuro:  Alert and Oriented x 3, Strength and sensation are intact Psych: euthymic mood, full affect  Wt Readings from Last 3 Encounters:  01/07/17 290 lb (131.5 kg)  03/08/16 (!) 304 lb 9.6 oz (138.2 kg)  02/16/16 (!) 301 lb 14.4 oz (136.9 kg)      Studies/Labs Reviewed:   EKG:  EKG is ordered today.  The ekg ordered today  demonstrates NSR without significant ST-T wave changes  Recent Labs: No results found for requested labs within last 8760 hours.   Lipid Panel No results found for: CHOL, TRIG, HDL, CHOLHDL, VLDL, LDLCALC, LDLDIRECT  Additional studies/ records that were reviewed today include:   Stress Echo 10/09/2012 - Stress ECG conclusions: The stress ECG was normal. Fair exercise capacity. - Baseline: LV global systolic function was normal. The estimated LV ejection fraction was 60%. Normal wall motion; no LV regional wall motion abnormalities. - Peak stress: LV global systolic function was vigorous. Normal wall motion; no LV regional wall motion abnormalities. - Impressions: Normal increase in thickening and contractility in all segments. Normal stress echo. Impressions:  - Normal increase in thickening and contractility in all segments. Normal stress echo.     ASSESSMENT:    1. Chest pain at rest   2. Bilateral lower extremity pain   3. Morbid obesity (HCC)   4. Controlled type 2 diabetes mellitus with complication, without long-term current use of insulin (HCC)   5. Hyperlipidemia, unspecified hyperlipidemia type      PLAN:  In order of problems listed above:  1. Chest pain at rest: She has at least 3 episodes of chest pain at rest in the past 6 months. Last episode was 3 weeks ago while she was in her car. It lasted a total of 4  minutes. Given her significant family history, we will pursue 2 day unknown nuclear stress test. Some of this can also be attributed to anxiety, she has lost 2 more brother recently due to premature MI. This is on top of the fact that she lost 2 cousins when they are in their 17s and also her father died in his 34s as well.  2. Bilateral lower extremity pain: Seems to occur more so with ambulation, she also complained of a burning sensation over her left thigh that is worse when she is laying on that side. Some of this is likely related to obesity.  3. DM II: On glimepiride, her recent hemoglobin A1c was 6.2.  4. Hyperlipidemia: Last lipid panel obtained in July 2017 showed lessor 220, HDL 63, triglyceride 174, LDL 122. She has cut back on carbohydrate since. I recommended recheck fasting lipid panel in 2 month at her PCPs office and have results sent to [place]. If it is still elevated, we will need to consider adding statin medication.   5. Morbid obesity: Ultimately, I think losing weight will help with her cholesterol, diabetes, and lower extremity pain, and decrease her cardiac risk factor.   Medication Adjustments/Labs and Tests Ordered: Current medicines are reviewed at length with the patient today.  Concerns regarding medicines are outlined above.  Medication changes, Labs and Tests ordered today are listed in the Patient Instructions below. Patient Instructions  Medication Instructions:  START taking aspirin 81 mg-one time daily.  This may be purchased over the counter.  Labwork: NONE  Testing/Procedures: Your physician has requested that you have a 2 day exercise stress myoview. For further information please visit [email or web address] Please follow instruction sheet, as given.  Your physician has requested that you have an ankle brachial index (ABI). During this test an ultrasound and blood pressure cuff are used to evaluate the arteries that supply the arms and legs with blood. Allow  thirty minutes for this exam. There are no restrictions or special instructions.   Follow-Up: If stress test is normal-follow up with Dr. Swaziland in 6 months. If abnormal-we will call you with results and  will schedule follow up appointment at that time.    Any Other Special Instructions Will Be Listed Below (If Applicable).  We suggest you have a lipid panel in 2 months with your PCP and have results faxed to our office at [phone]   If you need a refill on your cardiac medications before your next appointment, please call your pharmacy.      Ramond Dial, [place]  01/07/2017 4:02 PM    [facility] [address] [place], [place], [place]  [address] Phone: [phone]; Fax: [phone]

## 2017-01-13 ENCOUNTER — Telehealth (HOSPITAL_COMMUNITY): Payer: Self-pay

## 2017-01-13 NOTE — Telephone Encounter (Signed)
Encounter complete. 

## 2017-01-18 ENCOUNTER — Ambulatory Visit (HOSPITAL_COMMUNITY)
Admission: RE | Admit: 2017-01-18 | Discharge: 2017-01-18 | Disposition: A | Discharge status: Home / Self Care | Payer: No Typology Code available for payment source | Source: Ambulatory Visit | Attending: Cardiovascular Disease | Admitting: Cardiovascular Disease

## 2017-01-18 DIAGNOSIS — Z8249 Family history of ischemic heart disease and other diseases of the circulatory system: Secondary | ICD-10-CM | POA: Diagnosis not present

## 2017-01-18 DIAGNOSIS — E119 Type 2 diabetes mellitus without complications: Secondary | ICD-10-CM | POA: Insufficient documentation

## 2017-01-18 DIAGNOSIS — R0609 Other forms of dyspnea: Secondary | ICD-10-CM | POA: Insufficient documentation

## 2017-01-18 DIAGNOSIS — E669 Obesity, unspecified: Secondary | ICD-10-CM | POA: Insufficient documentation

## 2017-01-18 DIAGNOSIS — R079 Chest pain, unspecified: Secondary | ICD-10-CM | POA: Diagnosis not present

## 2017-01-18 DIAGNOSIS — Z6841 Body Mass Index (BMI) 40.0 and over, adult: Secondary | ICD-10-CM | POA: Diagnosis not present

## 2017-01-18 MED ORDER — TECHNETIUM TC 99M TETROFOSMIN IV KIT
32.1000 | PACK | Freq: Once | INTRAVENOUS | Status: AC | PRN
  Administered 2017-01-18: 32.1 via INTRAVENOUS
  Filled 2017-01-18: qty 33

## 2017-01-19 ENCOUNTER — Ambulatory Visit (HOSPITAL_COMMUNITY)
Admission: RE | Admit: 2017-01-19 | Discharge: 2017-01-19 | Disposition: A | Discharge status: Home / Self Care | Payer: No Typology Code available for payment source | Source: Ambulatory Visit | Attending: Cardiovascular Disease | Admitting: Cardiovascular Disease

## 2017-01-19 LAB — MYOCARDIAL PERFUSION IMAGING
CHL CUP MPHR: 172 {beats}/min
CHL CUP NUCLEAR SDS: 0
CHL CUP NUCLEAR SRS: 0
CHL CUP RESTING HR STRESS: 68 {beats}/min
CSEPPHR: 155 {beats}/min
Estimated workload: 7 METS
Exercise duration (min): 7 min
Exercise duration (sec): 0 s
LVDIAVOL: 87 mL (ref 46–106)
LVSYSVOL: 35 mL
Percent HR: 90 %
RPE: 18
SSS: 0
TID: 0.95

## 2017-01-19 MED ORDER — TECHNETIUM TC 99M TETROFOSMIN IV KIT
31.0000 | PACK | Freq: Once | INTRAVENOUS | Status: AC | PRN
  Administered 2017-01-19: 31 via INTRAVENOUS

## 2017-01-24 ENCOUNTER — Other Ambulatory Visit: Payer: Self-pay | Admitting: Physician Assistant

## 2017-01-24 DIAGNOSIS — I739 Peripheral vascular disease, unspecified: Secondary | ICD-10-CM

## 2017-01-28 ENCOUNTER — Inpatient Hospital Stay (HOSPITAL_COMMUNITY): Admission: RE | Admit: 2017-01-28 | Payer: No Typology Code available for payment source | Source: Ambulatory Visit

## 2017-02-18 ENCOUNTER — Ambulatory Visit (HOSPITAL_COMMUNITY)
Admission: RE | Admit: 2017-02-18 | Discharge: 2017-02-18 | Disposition: A | Discharge status: Home / Self Care | Payer: No Typology Code available for payment source | Source: Ambulatory Visit | Attending: Internal Medicine | Admitting: Internal Medicine

## 2017-02-18 DIAGNOSIS — E1151 Type 2 diabetes mellitus with diabetic peripheral angiopathy without gangrene: Secondary | ICD-10-CM | POA: Insufficient documentation

## 2017-02-18 DIAGNOSIS — E785 Hyperlipidemia, unspecified: Secondary | ICD-10-CM | POA: Diagnosis not present

## 2017-02-18 DIAGNOSIS — M79604 Pain in right leg: Secondary | ICD-10-CM | POA: Diagnosis not present

## 2017-02-18 DIAGNOSIS — M79605 Pain in left leg: Secondary | ICD-10-CM | POA: Diagnosis not present

## 2017-02-18 DIAGNOSIS — I739 Peripheral vascular disease, unspecified: Secondary | ICD-10-CM | POA: Diagnosis not present

## 2017-05-24 ENCOUNTER — Ambulatory Visit (HOSPITAL_COMMUNITY)
Admission: RE | Admit: 2017-05-24 | Discharge: 2017-05-24 | Disposition: A | Discharge status: Home / Self Care | Payer: No Typology Code available for payment source | Source: Ambulatory Visit | Attending: Surgery | Admitting: Surgery

## 2017-05-24 ENCOUNTER — Other Ambulatory Visit: Payer: Self-pay | Admitting: Surgery

## 2017-05-24 ENCOUNTER — Encounter (HOSPITAL_BASED_OUTPATIENT_CLINIC_OR_DEPARTMENT_OTHER): Payer: No Typology Code available for payment source | Attending: Surgery

## 2017-05-24 DIAGNOSIS — B952 Enterococcus as the cause of diseases classified elsewhere: Secondary | ICD-10-CM | POA: Insufficient documentation

## 2017-05-24 DIAGNOSIS — L97822 Non-pressure chronic ulcer of other part of left lower leg with fat layer exposed: Secondary | ICD-10-CM | POA: Insufficient documentation

## 2017-05-24 DIAGNOSIS — Z79899 Other long term (current) drug therapy: Secondary | ICD-10-CM | POA: Diagnosis not present

## 2017-05-24 DIAGNOSIS — L97529 Non-pressure chronic ulcer of other part of left foot with unspecified severity: Secondary | ICD-10-CM

## 2017-05-24 DIAGNOSIS — E11622 Type 2 diabetes mellitus with other skin ulcer: Secondary | ICD-10-CM | POA: Insufficient documentation

## 2017-05-24 DIAGNOSIS — Z6841 Body Mass Index (BMI) 40.0 and over, adult: Secondary | ICD-10-CM | POA: Insufficient documentation

## 2017-05-24 DIAGNOSIS — Z7982 Long term (current) use of aspirin: Secondary | ICD-10-CM | POA: Insufficient documentation

## 2017-05-24 DIAGNOSIS — B9561 Methicillin susceptible Staphylococcus aureus infection as the cause of diseases classified elsewhere: Secondary | ICD-10-CM | POA: Diagnosis not present

## 2017-06-03 ENCOUNTER — Encounter (HOSPITAL_BASED_OUTPATIENT_CLINIC_OR_DEPARTMENT_OTHER): Payer: No Typology Code available for payment source

## 2017-06-03 ENCOUNTER — Other Ambulatory Visit (HOSPITAL_COMMUNITY)
Admission: RE | Admit: 2017-06-03 | Discharge: 2017-06-03 | Disposition: A | Discharge status: Home / Self Care | Payer: No Typology Code available for payment source | Source: Other Acute Inpatient Hospital | Attending: Internal Medicine | Admitting: Internal Medicine

## 2017-06-03 DIAGNOSIS — E11622 Type 2 diabetes mellitus with other skin ulcer: Secondary | ICD-10-CM | POA: Diagnosis not present

## 2017-06-03 DIAGNOSIS — L97222 Non-pressure chronic ulcer of left calf with fat layer exposed: Secondary | ICD-10-CM | POA: Diagnosis not present

## 2017-06-06 LAB — AEROBIC CULTURE  (SUPERFICIAL SPECIMEN)

## 2017-06-06 LAB — AEROBIC CULTURE W GRAM STAIN (SUPERFICIAL SPECIMEN)

## 2017-06-10 DIAGNOSIS — E11622 Type 2 diabetes mellitus with other skin ulcer: Secondary | ICD-10-CM | POA: Diagnosis not present

## 2017-06-17 DIAGNOSIS — E11622 Type 2 diabetes mellitus with other skin ulcer: Secondary | ICD-10-CM | POA: Diagnosis not present

## 2017-06-24 ENCOUNTER — Encounter (HOSPITAL_BASED_OUTPATIENT_CLINIC_OR_DEPARTMENT_OTHER): Payer: No Typology Code available for payment source | Attending: Internal Medicine

## 2017-06-24 DIAGNOSIS — E1140 Type 2 diabetes mellitus with diabetic neuropathy, unspecified: Secondary | ICD-10-CM | POA: Insufficient documentation

## 2017-06-24 DIAGNOSIS — E11622 Type 2 diabetes mellitus with other skin ulcer: Secondary | ICD-10-CM | POA: Diagnosis not present

## 2017-06-24 DIAGNOSIS — Z6841 Body Mass Index (BMI) 40.0 and over, adult: Secondary | ICD-10-CM | POA: Diagnosis not present

## 2017-06-24 DIAGNOSIS — L97822 Non-pressure chronic ulcer of other part of left lower leg with fat layer exposed: Secondary | ICD-10-CM | POA: Insufficient documentation

## 2017-07-01 DIAGNOSIS — E11622 Type 2 diabetes mellitus with other skin ulcer: Secondary | ICD-10-CM | POA: Diagnosis not present

## 2017-07-08 DIAGNOSIS — E11622 Type 2 diabetes mellitus with other skin ulcer: Secondary | ICD-10-CM | POA: Diagnosis not present

## 2017-07-15 ENCOUNTER — Other Ambulatory Visit (HOSPITAL_COMMUNITY)
Admission: RE | Admit: 2017-07-15 | Discharge: 2017-07-15 | Disposition: A | Discharge status: Home / Self Care | Payer: No Typology Code available for payment source | Source: Other Acute Inpatient Hospital | Attending: Internal Medicine | Admitting: Internal Medicine

## 2017-07-15 DIAGNOSIS — L97222 Non-pressure chronic ulcer of left calf with fat layer exposed: Secondary | ICD-10-CM | POA: Insufficient documentation

## 2017-07-15 DIAGNOSIS — E11622 Type 2 diabetes mellitus with other skin ulcer: Secondary | ICD-10-CM | POA: Diagnosis not present

## 2017-07-18 LAB — AEROBIC CULTURE W GRAM STAIN (SUPERFICIAL SPECIMEN)

## 2017-07-18 LAB — AEROBIC CULTURE  (SUPERFICIAL SPECIMEN)

## 2017-07-22 DIAGNOSIS — E11622 Type 2 diabetes mellitus with other skin ulcer: Secondary | ICD-10-CM | POA: Diagnosis not present

## 2017-07-29 ENCOUNTER — Encounter (HOSPITAL_BASED_OUTPATIENT_CLINIC_OR_DEPARTMENT_OTHER): Payer: No Typology Code available for payment source | Attending: Internal Medicine

## 2017-07-29 DIAGNOSIS — E11622 Type 2 diabetes mellitus with other skin ulcer: Secondary | ICD-10-CM | POA: Insufficient documentation

## 2017-07-29 DIAGNOSIS — Z6841 Body Mass Index (BMI) 40.0 and over, adult: Secondary | ICD-10-CM | POA: Diagnosis not present

## 2017-07-29 DIAGNOSIS — L97822 Non-pressure chronic ulcer of other part of left lower leg with fat layer exposed: Secondary | ICD-10-CM | POA: Diagnosis not present

## 2017-07-29 DIAGNOSIS — E1140 Type 2 diabetes mellitus with diabetic neuropathy, unspecified: Secondary | ICD-10-CM | POA: Insufficient documentation

## 2017-08-01 ENCOUNTER — Ambulatory Visit: Payer: No Typology Code available for payment source | Attending: Orthopedic Surgery | Admitting: Physical Therapy

## 2017-08-01 ENCOUNTER — Encounter: Payer: Self-pay | Admitting: Physical Therapy

## 2017-08-01 DIAGNOSIS — M25561 Pain in right knee: Secondary | ICD-10-CM | POA: Insufficient documentation

## 2017-08-01 NOTE — Patient Instructions (Signed)
[facility]  DRY NEEDLING CONSENT FORM   Trigger point dry needling is a physical therapy approach to treat Myofascial Pain and Dysfunction.  Dry Needling (DN) is a valuable and effective way to deactivate myofascial trigger points (muscle knots/pain). It is skilled intervention that uses a thin filiform needle to penetrate the skin and stimulate underlying myofascial trigger points, muscular, and connective tissues for the management of neuromusculoskeletal pain and movement impairments.  A local twitch response (LTR) will be elicited.  This can sometimes feel like a deep ache in the muscle during the procedure. Multiple trigger points in multiple muscles can be treated during each treatment.  No medication of any kind is injected.   As with any medical treatment and procedure, there are possible adverse events.  While significant adverse events are uncommon, they do sometimes occur and must be considered prior to giving consent.  1. Dry needling often causes a "post needling soreness".  There can be an increase in pain from a couple of hours to 2-3 days, followed by an improvement in the overall pain state. 2. Any time a needle is used there is a risk of infection.  However, we are using new, sterile, and disposable needles; infections are extremely rare. 3. There is a possibility that you may bleed or bruise.  You may feel tired and some nausea following treatment. 4. There is a rare possibility of a pneumothorax (air in the chest cavity). 5. Allergic reaction to nickel in the stainless steel needle. 6. If a nerve is touched, it may cause paresthesia (a prickling/shock sensation) which is usually brief, but may continue for a couple of days.  Following treatment stay hydrated.  Continue regular activities but not too vigorous initially after treatment for 24-48 hours.  Dry Needling is best when combined with other physical therapy interventions such as  strengthening, stretching and other therapeutic modalities.   PLEASE ANSWER THE FOLLOWING QUESTIONS:  Do you have a lack of sensation?   Y/N  Do you have a phobia or fear of needles  Y/N  Are you pregnant?    Y/N If yes:  How many weeks? __________ Do you have any implanted devices?  Y/N If yes:  Pacemaker/Spinal Cord Stimulator/Deep Brain Stimulator/Insulin Pump/Other: ________________ Do you have any implants?  Y/N If yes: Breast/Facial/Pecs/Buttocks/Calves/Hip  Replacement/ Knee Replacement/Other: _________ Do you take any blood thinners?   Y/N If yes: Coumadin (Warfarin)/Other: ___________________ Do you have a bleeding disorder?   Y/N If yes: What kind: _________________________________ Do you take any immunosuppressants?  Y/N If yes:   What kind: _________________________________ Do you take anti-inflammatories?   Y/N If yes: What kind: Advil/Aspirin/Other: ________________ Have you ever been diagnosed with Scoliosis? Y/N Have you had back surgery?   Y/N If yes:  Laminectomy/Fusion/Other: ___________________   I have read, or had read to me, the above.  I have had the opportunity to ask any questions.  All of my questions have been answered to my satisfaction and I understand the risks involved with dry needling.  I consent to examination and treatment at [facility], including dry needling, of any and all of my involved and affected muscles.  

## 2017-08-01 NOTE — Therapy (Signed)
[facility] [address] [place], [place], [address] Phone: [phone]   Fax:  [phone]  Physical Therapy Evaluation  Patient Details  Name: EBONIQUE HALLSTROM MRN: [id number] Date of Birth: 11-Mar-1971 Referring Provider: Arturo Morton MD.  Encounter Date: 08/01/2017      PT End of Session - 08/01/17 1500    Visit Number 1   Number of Visits 12   Date for PT Re-Evaluation 09/12/17   PT Start Time 0231   PT Stop Time 0321   PT Time Calculation (min) 50 min   Activity Tolerance Patient tolerated treatment well   Behavior During Therapy [facility] for tasks assessed/performed      Past Medical History:  Diagnosis Date  . Anxiety    anxiety attacks are infrequent  . Asthma   . Chest tightness   . Diabetes (HCC)   . Meralgia paresthetica of left side 12/12/2015  . Obesity 09/27/2012  . Urinary frequency     Past Surgical History:  Procedure Laterality Date  . ABDOMINAL HYSTERECTOMY    . CESAREAN SECTION    . FOOT SURGERY    . HAMMERTOE RECONSTRUCTION WITH WEIL OSTEOTOMY Right 12/25/2015   Procedure: RIGHT THIRD HAMMERTOE CORRECTION, WEIL OSTEOTOMY;  Surgeon: Toni Arthurs, MD;  Location: [facility];  Service: Orthopedics;  Laterality: Right;  . HAND SURGERY    . HARDWARE REMOVAL Right 12/25/2015   Procedure: RIGHT FIFTH METATARSAL DEEP IMPLANT REMOVAL, EXOSTECTOMY;  Surgeon: Toni Arthurs, MD;  Location: [facility];  Service: Orthopedics;  Laterality: Right;  . NECK SURGERY    . TONSILLECTOMY      There were no vitals filed for this visit.       Subjective Assessment - 08/01/17 1513    Subjective The patient prsents with right knee pain that has been going on for about a month.  She has a left LE injury and has been increased weight bearing over her right LE.  She also reports going to [place] recently and walked two miles and went up and down 8 flights of stairs.  She is wearing a right knee brace with a  patellar brcae as she feels her knee could buckle and "give way."  Her pain today is a 6/10 but can rise to nearly a 10/10 with stairs.  She also reports calf cramping.  Rest decreases her pain.     Pertinent History Left LE wrapped and pump indwelling.  Right foot surgery in past.   How long can you walk comfortably? Shorts distances and too painful for stairs.   Patient Stated Goals Want to walk and do steps without pain.   Currently in Pain? Yes   Pain Score 6    Pain Location Knee   Pain Orientation Right   Pain Descriptors / Indicators Aching;Throbbing   Pain Type Acute pain   Pain Onset More than a month ago   Pain Frequency Constant   Aggravating Factors  See above.   Pain Relieving Factors See above.            [facility] PT Assessment - 08/01/17 0001      Assessment   Medical Diagnosis Patellofemoral disorder of right knee.   Referring Provider Arturo Morton MD.   Onset Date/Surgical Date --  One month.     Precautions   Precautions --  PAIN-FREE knee exercises.     Restrictions   Weight Bearing Restrictions No     Balance Screen   Has the  patient fallen in the past 6 months Yes   How many times? --  1.   Has the patient had a decrease in activity level because of a fear of falling?  No   Is the patient reluctant to leave their home because of a fear of falling?  No     Prior Function   Level of Independence Independent     Posture/Postural Control   Posture Comments Genu valgum.     ROM / Strength   AROM / PROM / Strength AROM;Strength     AROM   Overall AROM Comments Full active right knee range of motion.  Patient's right knee remarkable for crepitus during AROM.     Strength   Overall Strength Comments Normal right LE strength.     Palpation   Palpation comment Pain reported "under" kneecap.     Special Tests    Special Tests Knee Special Tests   Knee Special tests  Patellofemoral Grind Test (Clarke's Sign)  Mild valgus instability.      Patellofemoral Grind test (Clark's Sign)   Findings Postive   Side  Right     Ambulation/Gait   Gait Comments Antalgic gait pattern with a right knee brace with patellar orifice donned.            Objective measurements completed on examination: See above findings.          [facility] Adult PT Treatment/Exercise - 08/01/17 0001      Modalities   Modalities Vasopneumatic     Vasopneumatic   Number Minutes Vasopneumatic  15 minutes   Vasopnuematic Location  --  Left knee.   Vasopneumatic Pressure Medium                     PT Long Term Goals - 08/01/17 1539      PT LONG TERM GOAL #1   Title Independent with a HEP.   Time 6   Period Weeks   Status New     PT LONG TERM GOAL #2   Title Perform stairs with pain not > 2/10.   Time 6   Status New     PT LONG TERM GOAL #3   Title Perform ADL's with pain not > 3/10.                Plan - 08/01/17 1531    Clinical Impression Statement The patient presents to O[facility] with c/o right knee pain with she states began due to overcompenation over her right LE due to a left LE injury.  She demonstrates a positive right patellofemoral compression test.  Her knee is remarkable for crepitus during active movement.  Stairs produce severe pain.  Pain impairs patient's functional mobility.  Patient will benefit from skilled physical therapy.   History and Personal Factors relevant to plan of care: Previous right foot surgery.     Clinical Presentation Evolving   Clinical Decision Making Low   Rehab Potential Excellent   PT Frequency 2x / week   PT Duration 6 weeks   PT Treatment/Interventions ADLs/Self Care Home Management;Cryotherapy;Moist Heat;Ultrasound;Therapeutic activities;Functional mobility training;Therapeutic exercise;Patient/family education;Manual techniques;Vasopneumatic Device   PT Next Visit Plan Pain-free right quad exercises.  Moadlities PRN.   Consulted and Agree with Plan of Care Patient       Patient will benefit from skilled therapeutic intervention in order to improve the following deficits and impairments:  Abnormal gait, Pain, Decreased activity tolerance  Visit Diagnosis: Acute pain of right knee -  Plan: PT plan of care cert/re-cert     Problem List Patient Active Problem List   Diagnosis Date Noted  . Morbid obesity (HCC) 03/08/2016  . Meralgia paresthetica of left side 12/12/2015  . Obesity 09/27/2012  . Family history of coronary artery disease 09/27/2012  . Diabetes (HCC)   . Chest tightness     Sarh Kirschenbaum, [place] MPT 08/01/2017, 4:41 PM  [facility] [address] [place], [place], [address] Phone: [phone]   Fax:  [phone]  Name: Lillard AnesJeanette V Zieger MRN: [id number] Date of Birth: 11/10/1971

## 2017-08-02 ENCOUNTER — Ambulatory Visit: Payer: No Typology Code available for payment source | Admitting: *Deleted

## 2017-08-02 DIAGNOSIS — M25561 Pain in right knee: Secondary | ICD-10-CM

## 2017-08-02 NOTE — Therapy (Signed)
[facility] [address] [place], [place], [address] Phone: [phone]   Fax:  [phone]  Physical Therapy Treatment  Patient Details  Name: Lindsay Sanchez MRN: [id number] Date of Birth: 02/12/1971 Referring Provider: Arturo Morton MD.  Encounter Date: 08/02/2017      PT End of Session - 08/02/17 1349    Visit Number 2   Number of Visits 12   Date for PT Re-Evaluation 09/12/17   PT Start Time 1345   PT Stop Time 1435   PT Time Calculation (min) 50 min      Past Medical History:  Diagnosis Date  . Anxiety    anxiety attacks are infrequent  . Asthma   . Chest tightness   . Diabetes (HCC)   . Meralgia paresthetica of left side 12/12/2015  . Obesity 09/27/2012  . Urinary frequency     Past Surgical History:  Procedure Laterality Date  . ABDOMINAL HYSTERECTOMY    . CESAREAN SECTION    . FOOT SURGERY    . HAMMERTOE RECONSTRUCTION WITH WEIL OSTEOTOMY Right 12/25/2015   Procedure: RIGHT THIRD HAMMERTOE CORRECTION, WEIL OSTEOTOMY;  Surgeon: Toni Arthurs, MD;  Location: [facility];  Service: Orthopedics;  Laterality: Right;  . HAND SURGERY    . HARDWARE REMOVAL Right 12/25/2015   Procedure: RIGHT FIFTH METATARSAL DEEP IMPLANT REMOVAL, EXOSTECTOMY;  Surgeon: Toni Arthurs, MD;  Location: [facility];  Service: Orthopedics;  Laterality: Right;  . NECK SURGERY    . TONSILLECTOMY      There were no vitals filed for this visit.      Subjective Assessment - 08/02/17 1402    Subjective The patient prsents with right knee pain that has been going on for about a month.  She has a left LE injury and has been increased weight bearing over her right LE.  She also reports going to [place] recently and walked two miles and went up and down 8 flights of stairs.  She is wearing a right knee brace with a patellar brcae as she feels her knee could buckle and "give way."  Her pain today is a 6/10 but can rise to nearly a 10/10  with stairs.  She also reports calf cramping.  Rest decreases her pain.     Pertinent History Left LE wrapped and pump indwelling.  Right foot surgery in past.   How long can you walk comfortably? Shorts distances and too painful for stairs.   Patient Stated Goals Want to walk and do steps without pain.   Currently in Pain? Yes   Pain Score 6    Pain Location Knee   Pain Orientation Right   Pain Descriptors / Indicators Aching;Throbbing   Pain Type Acute pain   Pain Onset More than a month ago   Pain Frequency Constant                         [facility] Adult PT Treatment/Exercise - 08/02/17 0001      Exercises   Exercises Knee/Hip     Knee/Hip Exercises: Stretches   Active Hamstring Stretch Right;5 reps;30 seconds  and calf stretch     Knee/Hip Exercises: Aerobic   Nustep L5x 12 mins seat12     Knee/Hip Exercises: Standing   Rocker Board 3 minutes  PF/DF calf stretching     Knee/Hip Exercises: Seated   Long Arc Quad 4 sets;10 reps;Strengthening   Long Arc Quad Weight 3 lbs.  Modalities   Modalities Vasopneumatic     Vasopneumatic   Number Minutes Vasopneumatic  15 minutes   Vasopnuematic Location  --  Left knee.   Vasopneumatic Pressure Medium   Vasopneumatic Temperature  36     Manual Therapy   Manual Therapy Soft tissue mobilization   Soft tissue mobilization STW to RT calf while under stretch                     PT Long Term Goals - 08/01/17 1539      PT LONG TERM GOAL #1   Title Independent with a HEP.   Time 6   Period Weeks   Status New     PT LONG TERM GOAL #2   Title Perform stairs with pain not > 2/10.   Time 6   Status New     PT LONG TERM GOAL #3   Title Perform ADL's with pain not > 3/10.               Plan - 08/02/17 1354    Clinical Impression Statement Pt arrived today doing fairly well, but c/o RT knee pain mainly with steps and tightness in RT calf keeps it sore. She was able to perform RT knee  therex with minimal discomfort and had less calf pain end of Rx. Rx focused on RT quad strengthening and HS and calf stretching.   Clinical Presentation Evolving   PT Frequency 2x / week   PT Duration 6 weeks   PT Treatment/Interventions ADLs/Self Care Home Management;Cryotherapy;Moist Heat;Ultrasound;Therapeutic activities;Functional mobility training;Therapeutic exercise;Patient/family education;Manual techniques;Vasopneumatic Device   PT Next Visit Plan Pain-free right quad exercises.  Moadlities PRN.   Consulted and Agree with Plan of Care Patient      Patient will benefit from skilled therapeutic intervention in order to improve the following deficits and impairments:  Abnormal gait, Pain, Decreased activity tolerance  Visit Diagnosis: Acute pain of right knee     Problem List Patient Active Problem List   Diagnosis Date Noted  . Morbid obesity (HCC) 03/08/2016  . Meralgia paresthetica of left side 12/12/2015  . Obesity 09/27/2012  . Family history of coronary artery disease 09/27/2012  . Diabetes (HCC)   . Chest tightness     Emma-Lee Oddo,CHRIS, PTA 08/02/2017, 3:31 PM  [facility] [address] [place], [place], [address] Phone: [phone]   Fax:  [phone]  Name: Lindsay Sanchez MRN: [id number] Date of Birth: 10/16/1971

## 2017-08-05 DIAGNOSIS — E11622 Type 2 diabetes mellitus with other skin ulcer: Secondary | ICD-10-CM | POA: Diagnosis not present

## 2017-08-08 ENCOUNTER — Ambulatory Visit: Payer: No Typology Code available for payment source | Admitting: Physical Therapy

## 2017-08-08 ENCOUNTER — Encounter: Payer: Self-pay | Admitting: Physical Therapy

## 2017-08-08 DIAGNOSIS — M25561 Pain in right knee: Secondary | ICD-10-CM

## 2017-08-08 NOTE — Therapy (Signed)
[facility] [address] [place], [place], [address] Phone: [phone]   Fax:  [phone]  Physical Therapy Treatment  Patient Details  Name: Lindsay Sanchez MRN: [id number] Date of Birth: Sep 02, 1971 Referring Provider: Arturo Morton MD.  Encounter Date: 08/08/2017      PT End of Session - 08/08/17 1418    Visit Number 3   Number of Visits 12   Date for PT Re-Evaluation 09/12/17   PT Start Time 1343   PT Stop Time 1433   PT Time Calculation (min) 50 min   Activity Tolerance Patient tolerated treatment well   Behavior During Therapy [facility] for tasks assessed/performed      Past Medical History:  Diagnosis Date  . Anxiety    anxiety attacks are infrequent  . Asthma   . Chest tightness   . Diabetes (HCC)   . Meralgia paresthetica of left side 12/12/2015  . Obesity 09/27/2012  . Urinary frequency     Past Surgical History:  Procedure Laterality Date  . ABDOMINAL HYSTERECTOMY    . CESAREAN SECTION    . FOOT SURGERY    . HAMMERTOE RECONSTRUCTION WITH WEIL OSTEOTOMY Right 12/25/2015   Procedure: RIGHT THIRD HAMMERTOE CORRECTION, WEIL OSTEOTOMY;  Surgeon: Toni Arthurs, MD;  Location: [facility];  Service: Orthopedics;  Laterality: Right;  . HAND SURGERY    . HARDWARE REMOVAL Right 12/25/2015   Procedure: RIGHT FIFTH METATARSAL DEEP IMPLANT REMOVAL, EXOSTECTOMY;  Surgeon: Toni Arthurs, MD;  Location: [facility];  Service: Orthopedics;  Laterality: Right;  . NECK SURGERY    . TONSILLECTOMY      There were no vitals filed for this visit.      Subjective Assessment - 08/08/17 1350    Subjective Patient did well after last treament yet increased pain after stepping down wrong on small step and twinging knee.    Pertinent History Left LE wrapped and pump indwelling.  Right foot surgery in past.   How long can you walk comfortably? Shorts distances and too painful for stairs.   Patient Stated Goals Want to walk  and do steps without pain.   Currently in Pain? Yes   Pain Score 6    Pain Location Knee   Pain Orientation Right   Pain Descriptors / Indicators Aching;Discomfort;Sore   Pain Type Acute pain   Pain Onset More than a month ago   Pain Frequency Constant   Aggravating Factors  wrong movements   Pain Relieving Factors rest                         [facility] Adult PT Treatment/Exercise - 08/08/17 0001      Knee/Hip Exercises: Aerobic   Nustep x88min L5 UE/LE activity     Knee/Hip Exercises: Standing   Terminal Knee Extension Limitations 2x10, pink XTS with some discomfort     Knee/Hip Exercises: Seated   Long Arc Quad 4 sets;10 reps;Strengthening   Long Arc Quad Weight 3 lbs.     Knee/Hip Exercises: Supine   Terminal Knee Extension Strengthening;Right;3 sets;10 reps   Terminal Knee Extension Limitations 3#   Straight Leg Raises Strengthening;Right;2 sets;10 reps   Other Supine Knee/Hip Exercises hip abd with right side red t-band x30     Vasopneumatic   Number Minutes Vasopneumatic  15 minutes   Vasopnuematic Location  Knee   Vasopneumatic Pressure Medium  PT Long Term Goals - 08/08/17 1419      PT LONG TERM GOAL #1   Title Independent with a HEP.   Time 6   Period Weeks   Status On-going     PT LONG TERM GOAL #2   Title Perform stairs with pain not > 2/10.   Time 6   Period Weeks   Status On-going     PT LONG TERM GOAL #3   Title Perform ADL's with pain not > 3/10.   Time 6   Period Weeks   Status On-going               Plan - 08/08/17 1419    Clinical Impression Statement Patient tolerated treatment well today. Patient has no pain with exercises except minor discomfort with TKE. Patient able to progress strengthening exercises today. Patient has limitations due to other medical issue with left LE and unable to perform prolong standing exercises or exercises which invole left LE. Patient current goals ongoing  at this time.    Rehab Potential Excellent   PT Frequency 2x / week   PT Duration 6 weeks   PT Treatment/Interventions ADLs/Self Care Home Management;Cryotherapy;Moist Heat;Ultrasound;Therapeutic activities;Functional mobility training;Therapeutic exercise;Patient/family education;Manual techniques;Vasopneumatic Device   PT Next Visit Plan Pain-free right quad exercises.  Moadlities PRN.   Consulted and Agree with Plan of Care Patient      Patient will benefit from skilled therapeutic intervention in order to improve the following deficits and impairments:  Abnormal gait, Pain, Decreased activity tolerance  Visit Diagnosis: Acute pain of right knee     Problem List Patient Active Problem List   Diagnosis Date Noted  . Morbid obesity (HCC) 03/08/2016  . Meralgia paresthetica of left side 12/12/2015  . Obesity 09/27/2012  . Family history of coronary artery disease 09/27/2012  . Diabetes (HCC)   . Chest tightness     Caetano Oberhaus P, PTA 08/08/2017, 2:40 PM  [facility] [address] [place], [place], [address] Phone: [phone]   Fax:  [phone]  Name: Lindsay Sanchez MRN: [id number] Date of Birth: January 10, 1971

## 2017-08-11 ENCOUNTER — Other Ambulatory Visit: Payer: Self-pay | Admitting: Internal Medicine

## 2017-08-11 ENCOUNTER — Encounter: Payer: No Typology Code available for payment source | Admitting: Physical Therapy

## 2017-08-11 ENCOUNTER — Ambulatory Visit (HOSPITAL_COMMUNITY)
Admission: RE | Admit: 2017-08-11 | Discharge: 2017-08-11 | Disposition: A | Discharge status: Home / Self Care | Payer: No Typology Code available for payment source | Source: Ambulatory Visit | Attending: Internal Medicine | Admitting: Internal Medicine

## 2017-08-11 DIAGNOSIS — L97222 Non-pressure chronic ulcer of left calf with fat layer exposed: Secondary | ICD-10-CM | POA: Insufficient documentation

## 2017-08-11 DIAGNOSIS — E11622 Type 2 diabetes mellitus with other skin ulcer: Secondary | ICD-10-CM | POA: Diagnosis not present

## 2017-08-11 DIAGNOSIS — M8600 Acute hematogenous osteomyelitis, unspecified site: Secondary | ICD-10-CM

## 2017-08-16 ENCOUNTER — Ambulatory Visit: Payer: No Typology Code available for payment source | Admitting: *Deleted

## 2017-08-16 DIAGNOSIS — M25561 Pain in right knee: Secondary | ICD-10-CM | POA: Diagnosis not present

## 2017-08-16 NOTE — Therapy (Signed)
[facility] [address] [place], [place], [address] Phone: [phone]   Fax:  [phone]  Physical Therapy Treatment  Patient Details  Name: Lindsay Sanchez MRN: [id number] Date of Birth: 1970-12-06 Referring Provider: Arturo Morton MD.  Encounter Date: 08/16/2017      PT End of Session - 08/16/17 1520    Visit Number 4   Number of Visits 12   Date for PT Re-Evaluation 09/12/17   PT Start Time 1519   PT Stop Time 1609   PT Time Calculation (min) 50 min      Past Medical History:  Diagnosis Date  . Anxiety    anxiety attacks are infrequent  . Asthma   . Chest tightness   . Diabetes (HCC)   . Meralgia paresthetica of left side 12/12/2015  . Obesity 09/27/2012  . Urinary frequency     Past Surgical History:  Procedure Laterality Date  . ABDOMINAL HYSTERECTOMY    . CESAREAN SECTION    . FOOT SURGERY    . HAMMERTOE RECONSTRUCTION WITH WEIL OSTEOTOMY Right 12/25/2015   Procedure: RIGHT THIRD HAMMERTOE CORRECTION, WEIL OSTEOTOMY;  Surgeon: Toni Arthurs, MD;  Location: [facility];  Service: Orthopedics;  Laterality: Right;  . HAND SURGERY    . HARDWARE REMOVAL Right 12/25/2015   Procedure: RIGHT FIFTH METATARSAL DEEP IMPLANT REMOVAL, EXOSTECTOMY;  Surgeon: Toni Arthurs, MD;  [facility];  Service: Orthopedics;  Laterality: Right;  . NECK SURGERY    . TONSILLECTOMY      There were no vitals filed for this visit.                       [facility] Adult PT Treatment/Exercise - 08/16/17 0001      Knee/Hip Exercises: Aerobic   Nustep x81min L5 UE/LE activity     Knee/Hip Exercises: Standing   Terminal Knee Extension Limitations 3 x10, pink XTS with some discomfort   Rocker Board 3 minutes  PF/DF calf stretching     Knee/Hip Exercises: Seated   Long Arc Quad 4 sets;10 reps;Strengthening   Long Arc Quad Weight 4 lbs.     Modalities   Modalities Vasopneumatic     Vasopneumatic    Number Minutes Vasopneumatic  15 minutes   Vasopnuematic Location  Knee   Vasopneumatic Pressure Medium   Vasopneumatic Temperature  36     Manual Therapy   Manual Therapy Soft tissue mobilization   Soft tissue mobilization medial patella mobs and lateral STW                     PT Long Term Goals - 08/08/17 1419      PT LONG TERM GOAL #1   Title Independent with a HEP.   Time 6   Period Weeks   Status On-going     PT LONG TERM GOAL #2   Title Perform stairs with pain not > 2/10.   Time 6   Period Weeks   Status On-going     PT LONG TERM GOAL #3   Title Perform ADL's with pain not > 3/10.   Time 6   Period Weeks   Status On-going               Plan - 08/16/17 1626    Clinical Impression Statement Pr arrived today having some increased pain and swelling due to a lot of standing this weekend at her daughter's wedding. She was still  able to perform therex for RT knee CKC/OKC with minimal increase in pain. WB TKEs were the most challenging due to Medical issues on LT LE. Normal response to vaso and reports decreased pain after Rx.   Clinical Presentation Evolving   Clinical Decision Making Low   Rehab Potential Excellent   PT Frequency 2x / week   PT Duration 6 weeks   PT Treatment/Interventions ADLs/Self Care Home Management;Cryotherapy;Moist Heat;Ultrasound;Therapeutic activities;Functional mobility training;Therapeutic exercise;Patient/family education;Manual techniques;Vasopneumatic Device   PT Next Visit Plan Pain-free right quad exercises.  Moadlities PRN.   Consulted and Agree with Plan of Care Patient      Patient will benefit from skilled therapeutic intervention in order to improve the following deficits and impairments:  Abnormal gait, Pain, Decreased activity tolerance  Visit Diagnosis: Acute pain of right knee     Problem List Patient Active Problem List   Diagnosis Date Noted  . Morbid obesity (HCC) 03/08/2016  . Meralgia  paresthetica of left side 12/12/2015  . Obesity 09/27/2012  . Family history of coronary artery disease 09/27/2012  . Diabetes (HCC)   . Chest tightness     Mikala Podoll,CHRIS, PTA 08/16/2017, 4:32 PM  [facility] [address] [place], [place], [address] Phone: [phone]   Fax:  [phone]  Name: Lindsay Sanchez MRN: [id number] Date of Birth: 07-Dec-1970

## 2017-08-18 ENCOUNTER — Ambulatory Visit: Payer: No Typology Code available for payment source | Admitting: *Deleted

## 2017-08-18 DIAGNOSIS — M25561 Pain in right knee: Secondary | ICD-10-CM

## 2017-08-18 NOTE — Therapy (Signed)
[facility] [address] [place], [place], [address] Phone: [phone]   Fax:  [phone]  Physical Therapy Treatment  Patient Details  Name: Lindsay Sanchez MRN: [id number] Date of Birth: 12/05/1970 Referring Provider: Arturo Morton MD.  Encounter Date: 08/18/2017      PT End of Session - 08/18/17 1545    Visit Number 5   Number of Visits 12   Date for PT Re-Evaluation 09/12/17   PT Start Time 1515   PT Stop Time 1605   PT Time Calculation (min) 50 min      Past Medical History:  Diagnosis Date  . Anxiety    anxiety attacks are infrequent  . Asthma   . Chest tightness   . Diabetes (HCC)   . Meralgia paresthetica of left side 12/12/2015  . Obesity 09/27/2012  . Urinary frequency     Past Surgical History:  Procedure Laterality Date  . ABDOMINAL HYSTERECTOMY    . CESAREAN SECTION    . FOOT SURGERY    . HAMMERTOE RECONSTRUCTION WITH WEIL OSTEOTOMY Right 12/25/2015   Procedure: RIGHT THIRD HAMMERTOE CORRECTION, WEIL OSTEOTOMY;  Surgeon: Toni Arthurs, MD;  Location: [facility];  Service: Orthopedics;  Laterality: Right;  . HAND SURGERY    . HARDWARE REMOVAL Right 12/25/2015   Procedure: RIGHT FIFTH METATARSAL DEEP IMPLANT REMOVAL, EXOSTECTOMY;  Surgeon: Toni Arthurs, MD;  Location: [facility];  Service: Orthopedics;  Laterality: Right;  . NECK SURGERY    . TONSILLECTOMY      There were no vitals filed for this visit.      Subjective Assessment - 08/18/17 1525    Subjective Doing better today with less pain   Pertinent History Left LE wrapped and pump indwelling.  Right foot surgery in past.   How long can you walk comfortably? Shorts distances and too painful for stairs.   Patient Stated Goals Want to walk and do steps without pain.   Currently in Pain? Yes   Pain Score 5    Pain Location Knee   Pain Orientation Right   Pain Descriptors / Indicators Discomfort   Pain Type Acute pain   Pain  Onset More than a month ago   Pain Frequency Constant                         [facility] Adult PT Treatment/Exercise - 08/18/17 0001      Knee/Hip Exercises: Aerobic   Nustep x25min L5 UE/LE activity     Knee/Hip Exercises: Standing   Terminal Knee Extension Limitations 3 x10, pink XTS with some discomfort   Rocker Board 3 minutes  PF/DF calf stretching     Knee/Hip Exercises: Seated   Long Arc Quad 4 sets;10 reps;Strengthening   Long Arc Quad Weight 5 lbs.     Modalities   Modalities Vasopneumatic     Vasopneumatic   Number Minutes Vasopneumatic  15 minutes   Vasopnuematic Location  Knee   Vasopneumatic Pressure Medium   Vasopneumatic Temperature  36     Manual Therapy   Manual Therapy Soft tissue mobilization   Soft tissue mobilization medial patella mobs and lateral STW                     PT Long Term Goals - 08/08/17 1419      PT LONG TERM GOAL #1   Title Independent with a HEP.   Time 6   Period  Weeks   Status On-going     PT LONG TERM GOAL #2   Title Perform stairs with pain not > 2/10.   Time 6   Period Weeks   Status On-going     PT LONG TERM GOAL #3   Title Perform ADL's with pain not > 3/10.   Time 6   Period Weeks   Status On-going               Plan - 08/18/17 1807    Clinical Impression Statement Pt arrived today stating that her knee popped the other day, but actually felt better. She was able to progress with PREs today and did fairly well. WB TKEs were still the most challenging and caused the most discomfort. Pt mainly has RT knee pain with WB activities  and when her knee is completely flat.Marland Kitchen  LTGs are ongoing due to pain    PT Frequency 2x / week   PT Duration 6 weeks   PT Treatment/Interventions ADLs/Self Care Home Management;Cryotherapy;Moist Heat;Therapeutic activities;Functional mobility training;Therapeutic exercise;Patient/family education;Manual techniques;Vasopneumatic Device;Ultrasound   PT Next  Visit Plan Pain-free right quad exercises.  Moadlities PRN.   Consulted and Agree with Plan of Care Patient      Patient will benefit from skilled therapeutic intervention in order to improve the following deficits and impairments:  Abnormal gait, Pain, Decreased activity tolerance  Visit Diagnosis: Acute pain of right knee     Problem List Patient Active Problem List   Diagnosis Date Noted  . Morbid obesity (HCC) 03/08/2016  . Meralgia paresthetica of left side 12/12/2015  . Obesity 09/27/2012  . Family history of coronary artery disease 09/27/2012  . Diabetes (HCC)   . Chest tightness     Courtez Twaddle,CHRIS, PTA 08/18/2017, 6:14 PM  [facility] [address] [place], [place], [address] Phone: [phone]   Fax:  [phone]  Name: Lindsay Sanchez MRN: [id number] Date of Birth: 16-May-1971

## 2017-08-19 DIAGNOSIS — E11622 Type 2 diabetes mellitus with other skin ulcer: Secondary | ICD-10-CM | POA: Diagnosis not present

## 2017-08-23 ENCOUNTER — Ambulatory Visit: Payer: No Typology Code available for payment source | Attending: Orthopedic Surgery | Admitting: *Deleted

## 2017-08-23 DIAGNOSIS — M25561 Pain in right knee: Secondary | ICD-10-CM | POA: Diagnosis not present

## 2017-08-23 NOTE — Therapy (Addendum)
[facility] [place], [place], [address] Phone: [phone]   Fax:  [phone]  Physical Therapy Treatment  Patient Details  Name: Lindsay Sanchez MRN: [id number] Date of Birth: Sep 26, 1971 Referring Provider: Catha Nottingham MD.  Encounter Date: 08/23/2017      PT End of Session - 08/23/17 1534    Visit Number 6   Number of Visits 12   Date for PT Re-Evaluation 09/12/17   PT Start Time 1528   PT Stop Time 1609   PT Time Calculation (min) 41 min      Past Medical History:  Diagnosis Date  . Anxiety    anxiety attacks are infrequent  . Asthma   . Chest tightness   . Diabetes ([place])   . Meralgia paresthetica of left side 12/12/2015  . Obesity 09/27/2012  . Urinary frequency     Past Surgical History:  Procedure Laterality Date  . ABDOMINAL HYSTERECTOMY    . CESAREAN SECTION    . FOOT SURGERY    . HAMMERTOE RECONSTRUCTION WITH WEIL OSTEOTOMY Right 12/25/2015   Procedure: RIGHT THIRD HAMMERTOE CORRECTION, WEIL OSTEOTOMY;  Surgeon: Wylene Simmer, MD;  Location: [facility];  Service: Orthopedics;  Laterality: Right;  . HAND SURGERY    . HARDWARE REMOVAL Right 12/25/2015   Procedure: RIGHT FIFTH METATARSAL DEEP IMPLANT REMOVAL, EXOSTECTOMY;  Surgeon: Wylene Simmer, MD;  Location: [facility];  Service: Orthopedics;  Laterality: Right;  . NECK SURGERY    . TONSILLECTOMY      There were no vitals filed for this visit.      Subjective Assessment - 08/23/17 1530    Subjective Doing better today with less pain RT Knee. Able to walk better   Pertinent History Left LE wrapped and pump indwelling.  Right foot surgery in past.   How long can you walk comfortably? Shorts distances and too painful for stairs.   Patient Stated Goals Want to walk and do steps without pain.   Currently in Pain? Yes   Pain Score 3    Pain Location Knee   Pain Orientation Right   Pain Descriptors / Indicators Discomfort   Pain Type Acute pain   Pain Onset More than a month ago                         [facility] Adult PT Treatment/Exercise - 08/23/17 0001      Knee/Hip Exercises: Standing   Terminal Knee Extension Limitations 4 x10, pink XTS with some discomfort   Rocker Board 5 minutes  PF/DF calf stretching     Knee/Hip Exercises: Seated   Long Arc Quad 4 sets;10 reps;Strengthening     Modalities   Modalities Vasopneumatic     Vasopneumatic   Number Minutes Vasopneumatic  15 minutes   Vasopnuematic Location  Knee   Vasopneumatic Pressure Medium   Vasopneumatic Temperature  36     Manual Therapy   Manual Therapy Soft tissue mobilization   Soft tissue mobilization medial patella mobs and lateral STW                     PT Long Term Goals - 08/08/17 1419      PT LONG TERM GOAL #1   Title Independent with a HEP.   Time 6   Period Weeks   Status On-going     PT LONG TERM GOAL #2   Title Perform stairs with pain not >  2/10.   Time 6   Period Weeks   Status On-going     PT LONG TERM GOAL #3   Title Perform ADL's with pain not > 3/10.   Time 6   Period Weeks   Status On-going               Plan - 08/23/17 1535    Clinical Impression Statement Pt arrived 13 mins late. Rx focused on pain-free quad strengthening OKC/CKC. She was able to complete Therex and act's for RT knee with minimal discomfort. Improved Gait today   Clinical Decision Making Low   Rehab Potential Excellent   PT Frequency 2x / week   PT Duration 6 weeks   PT Treatment/Interventions ADLs/Self Care Home Management;Cryotherapy;Moist Heat;Therapeutic activities;Functional mobility training;Therapeutic exercise;Patient/family education;Manual techniques;Vasopneumatic Device;Ultrasound   PT Next Visit Plan Pain-free right quad exercises.  Moadlities PRN.   Consulted and Agree with Plan of Care Patient      Patient will benefit from skilled therapeutic intervention in order to improve the  following deficits and impairments:  Abnormal gait, Pain, Decreased activity tolerance  Visit Diagnosis: Acute pain of right knee     Problem List Patient Active Problem List   Diagnosis Date Noted  . Morbid obesity ([place]) 03/08/2016  . Meralgia paresthetica of left side 12/12/2015  . Obesity 09/27/2012  . Family history of coronary artery disease 09/27/2012  . Diabetes ([place])   . Chest tightness     Chloe Bluett,CHRIS, PTA 08/23/2017, 4:13 PM  [facility] [place], [place], [address] Phone: [phone]   Fax:  [phone]  Name: Lindsay Sanchez MRN: [id number] Date of Birth: 11-07-1971  PHYSICAL THERAPY DISCHARGE SUMMARY  Visits from Start of Care: 6.  Current functional level related to goals / functional outcomes: See above.   Remaining deficits: See below.   Education / Equipment: HEP. Plan: Patient agrees to discharge.  Patient goals were not met. Patient is being discharged due to not returning since the last visit.  ?????         Mali Applegate MPT

## 2017-08-25 ENCOUNTER — Encounter: Payer: No Typology Code available for payment source | Admitting: Physical Therapy

## 2017-09-02 ENCOUNTER — Encounter (HOSPITAL_BASED_OUTPATIENT_CLINIC_OR_DEPARTMENT_OTHER): Payer: No Typology Code available for payment source | Attending: Internal Medicine

## 2017-09-02 DIAGNOSIS — E1140 Type 2 diabetes mellitus with diabetic neuropathy, unspecified: Secondary | ICD-10-CM | POA: Diagnosis not present

## 2017-09-02 DIAGNOSIS — Z6841 Body Mass Index (BMI) 40.0 and over, adult: Secondary | ICD-10-CM | POA: Insufficient documentation

## 2017-09-02 DIAGNOSIS — E11622 Type 2 diabetes mellitus with other skin ulcer: Secondary | ICD-10-CM | POA: Diagnosis not present

## 2017-09-02 DIAGNOSIS — I890 Lymphedema, not elsewhere classified: Secondary | ICD-10-CM | POA: Insufficient documentation

## 2017-09-02 DIAGNOSIS — L97822 Non-pressure chronic ulcer of other part of left lower leg with fat layer exposed: Secondary | ICD-10-CM | POA: Insufficient documentation

## 2017-09-09 DIAGNOSIS — E11622 Type 2 diabetes mellitus with other skin ulcer: Secondary | ICD-10-CM | POA: Diagnosis not present

## 2017-09-16 DIAGNOSIS — E11622 Type 2 diabetes mellitus with other skin ulcer: Secondary | ICD-10-CM | POA: Diagnosis not present

## 2017-09-23 ENCOUNTER — Encounter (HOSPITAL_BASED_OUTPATIENT_CLINIC_OR_DEPARTMENT_OTHER): Payer: No Typology Code available for payment source | Attending: Internal Medicine

## 2017-09-23 DIAGNOSIS — Z6841 Body Mass Index (BMI) 40.0 and over, adult: Secondary | ICD-10-CM | POA: Diagnosis not present

## 2017-09-23 DIAGNOSIS — E11622 Type 2 diabetes mellitus with other skin ulcer: Secondary | ICD-10-CM | POA: Diagnosis present

## 2017-09-23 DIAGNOSIS — E1140 Type 2 diabetes mellitus with diabetic neuropathy, unspecified: Secondary | ICD-10-CM | POA: Diagnosis not present

## 2017-09-23 DIAGNOSIS — L97222 Non-pressure chronic ulcer of left calf with fat layer exposed: Secondary | ICD-10-CM | POA: Insufficient documentation

## 2017-09-30 DIAGNOSIS — E11622 Type 2 diabetes mellitus with other skin ulcer: Secondary | ICD-10-CM | POA: Diagnosis not present

## 2018-07-04 LAB — HM PAP SMEAR

## 2018-07-04 LAB — HEMOGLOBIN A1C: HEMOGLOBIN A1C: 6.4

## 2018-07-20 ENCOUNTER — Ambulatory Visit: Payer: No Typology Code available for payment source | Admitting: Family Medicine

## 2018-07-20 ENCOUNTER — Encounter: Payer: Self-pay | Admitting: Family Medicine

## 2018-07-20 VITALS — BP 124/72 | HR 70 | Temp 98.2°F | Ht 65.0 in | Wt 260.7 lb

## 2018-07-20 DIAGNOSIS — Z7189 Other specified counseling: Secondary | ICD-10-CM | POA: Diagnosis not present

## 2018-07-20 DIAGNOSIS — K1370 Unspecified lesions of oral mucosa: Secondary | ICD-10-CM

## 2018-07-20 DIAGNOSIS — Z7185 Encounter for immunization safety counseling: Secondary | ICD-10-CM

## 2018-07-20 DIAGNOSIS — Z23 Encounter for immunization: Secondary | ICD-10-CM | POA: Diagnosis not present

## 2018-07-20 DIAGNOSIS — E119 Type 2 diabetes mellitus without complications: Secondary | ICD-10-CM | POA: Diagnosis not present

## 2018-07-20 NOTE — Progress Notes (Signed)
HPI:  Lindsay Sanchez is here to establish care.  She did not have a good experience with her last primary care provider and was nervous about coming to the doctor today.  She had her labs done with her gynecologist not too long ago.  She has a past medical history significant for diabetes, morbid obesity and early heart disease and a number of family members.  She had side effects to at least 6 different medications for diabetes.  She did not tolerate metformin, glimepiride and a number of other medications.  She and her husband decided to try to treat her diabetes with lifestyle changes instead.  She does a very low sugar diet and is getting regular exercise.  She reports she has lost 72 pounds with this diet and exercise and feels great.  Currently is off all medications.  She does take some supplements.  She reports her last hemoglobin A1c done recently with her gynecologist, Dr. Laury Axon was 6.7.  Prefers not to take medications if possible.  She has a new concern of a lesion in her mouth.  This is been here for quite some time.  It is a flesh-colored lesion that is nonpainful.  However she sometimes will accidentally bite it.  She would like to see a surgeon for removal.  ROS negative for unless reported above: fevers, unintentional weight loss, hearing or vision loss, chest pain, palpitations, struggling to breath, hemoptysis, melena, hematochezia, hematuria, falls, loc, si, thoughts of self harm  Past Medical History:  Diagnosis Date  . Anxiety    anxiety attacks are infrequent  . Asthma   . Chest tightness   . Diabetes (HCC)   . Macular degeneration    per patient-treated by Dr Dione Booze  . Meralgia paresthetica of left side 12/12/2015  . Obesity 09/27/2012  . Urinary frequency     Past Surgical History:  Procedure Laterality Date  . ABDOMINAL HYSTERECTOMY    . CESAREAN SECTION    . FOOT SURGERY    . HAMMERTOE RECONSTRUCTION WITH WEIL OSTEOTOMY Right 12/25/2015   Procedure:  RIGHT THIRD HAMMERTOE CORRECTION, WEIL OSTEOTOMY;  Surgeon: Toni Arthurs, MD;  Location: [facility];  Service: Orthopedics;  Laterality: Right;  . HAND SURGERY    . HARDWARE REMOVAL Right 12/25/2015   Procedure: RIGHT FIFTH METATARSAL DEEP IMPLANT REMOVAL, EXOSTECTOMY;  Surgeon: Toni Arthurs, MD;  Location: [facility];  Service: Orthopedics;  Laterality: Right;  . NECK SURGERY    . TONSILLECTOMY      Family History  Problem Relation Age of Onset  . Arrhythmia Mother        V TACH  . Ovarian cancer Mother   . Breast cancer Mother 29  . Heart attack Father 66  . Heart attack Maternal Grandfather   . Cancer Maternal Grandfather   . Heart disease Paternal Grandmother   . Sudden Cardiac Death Brother   . Heart attack Brother   . Breast cancer Maternal Aunt   . Breast cancer Maternal Aunt   . Breast cancer Cousin        maternal first cousin    Social History   Socioeconomic History  . Marital status: Married    Spouse name: Not on file  . Number of children: 2  . Years of education: 57  . Highest education level: Not on file  Occupational History  . Occupation: Therapist, nutritional  Social Needs  . Financial resource strain: Not on file  . Food insecurity:  Worry: Not on file    Inability: Not on file  . Transportation needs:    Medical: Not on file    Non-medical: Not on file  Tobacco Use  . Smoking status: Never Smoker  . Smokeless tobacco: Never Used  Substance and Sexual Activity  . Alcohol use: Yes  . Drug use: No  . Sexual activity: Not on file  Lifestyle  . Physical activity:    Days per week: Not on file    Minutes per session: Not on file  . Stress: Not on file  Relationships  . Social connections:    Talks on phone: Not on file    Gets together: Not on file    Attends religious service: Not on file    Active member of club or organization: Not on file    Attends meetings of clubs or organizations: Not on file    Relationship  status: Not on file  Other Topics Concern  . Not on file  Social History Narrative   Patient drinks about 1 cup of caffeine daily.   Patient is right handed.      Current Outpatient Medications:  .  ALPHA LIPOIC ACID PO, Take 400 mg by mouth every morning., Disp: , Rfl:  .  Ascorbic Acid (VITAMIN C) 1000 MG tablet, Take 1,000 mg by mouth daily., Disp: , Rfl:  .  cetirizine (ZYRTEC) 10 MG tablet, Take 10 mg by mouth at bedtime., Disp: , Rfl:  .  Cholecalciferol (VITAMIN D3 PO), Take 2,000 Units by mouth daily., Disp: , Rfl:  .  loratadine (CLARITIN) 10 MG tablet, Take 10 mg by mouth every morning., Disp: , Rfl:  .  MAGNESIUM CITRATE PO, Take 100 mg by mouth daily., Disp: , Rfl:  .  Multiple Vitamin (MULTIVITAMIN) tablet, Take 1 tablet by mouth daily., Disp: , Rfl:  .  Multiple Vitamins-Minerals (ZINC PO), Take 50 mg by mouth daily., Disp: , Rfl:  .  OVER THE COUNTER MEDICATION, [place], Disp: , Rfl:  .  Potassium 99 MG TABS, Take by mouth daily., Disp: , Rfl:  .  SELENIUM PO, Take 200 mg by mouth daily., Disp: , Rfl:  .  VITAMIN A PO, Take by mouth., Disp: , Rfl:   EXAM:  Vitals:   07/20/18 1426  BP: 124/72  Pulse: 70  Temp: 98.2 F (36.8 C)    Body mass index is 43.38 kg/m.  GENERAL: vitals reviewed and listed above, alert, oriented, appears well hydrated and in no acute distress  HEENT: atraumatic, conjunttiva clear, no obvious abnormalities on inspection of external nose and ears, on inspection of the oropharynx she has a flesh-colored dome-shaped lesion in the right buccal region of her mouth.  No ulceration.  No signs of infection.  NECK: no obvious masses on inspection  LUNGS: clear to auscultation bilaterally, no wheezes, rales or rhonchi, good air movement  CV: HRRR, no peripheral edema  MS: moves all extremities without noticeable abnormality  PSYCH: pleasant and cooperative, no obvious depression or anxiety  ASSESSMENT AND PLAN:  Discussed the following  assessment and plan: More than 50% of over 30 minutes spent in total in caring for this patient was spent face-to-face with the patient, counseling and/or coordinating care.    Oral lesion - Plan: Ambulatory referral to Oral Maxillofacial Surgery  Diabetes mellitus without complication (HCC) - Plan: Microalbumin / creatinine urine ratio -Congratulated on good control with lifestyle, foot exam done, labs per order and ask assistant to obtain and abstract  her labs done elsewhere recently for her hemoglobin A1c  Vaccine counseling she opted to get several vaccines today, see below  Morbid obesity (HCC) - Plan: Lipid panel -Congratulated on significant lifestyle changes and weight reduction, encouraged her to continue for life  Need for tetanus booster - Plan: Td vaccine greater than or equal to 7yo preservative free IM  Need for prophylactic vaccination against Streptococcus pneumoniae (pneumococcus) - Plan: Pneumococcal polysaccharide vaccine 23-valent greater than or equal to 2yo subcutaneous/IM -We reviewed the PMH, PSH, FH, SH, Meds and Allergies. -We addressed current concerns per orders and patient instructions. -We have asked for records for pertinent exams, studies, vaccines and notes from previous providers. -We have advised patient to follow up per instructions below.    Patient Instructions  BEFORE YOU LEAVE: -pneumococcal vaccine and tetanus booster -obtain and abstract labs (hgba1c) from gyn and health maintenance from gyn -labs -follow up:  1) flu shot visit in October 2) 3-4 months  It was so nice to meet you today. Please keep healing yourself!   We will place a referral for you as discussed to an oral surgeon. It usually takes about 1-2 weeks to process and schedule this referral. If you have not heard from [place] regarding this appointment in 2 weeks please contact our office.   We have ordered labs or studies at this visit. It can take up to 1-2 weeks for results and  processing. IF results require follow up or explanation, we will call you with instructions. Clinically stable results will be released to your [facility]. If you have not heard from [place] or cannot find your results in [facility] in 2 weeks please contact our office at [phone].  If you are not yet signed up for [facility], please consider signing up.            Terressa Koyanagi

## 2018-07-20 NOTE — Patient Instructions (Addendum)
BEFORE YOU LEAVE: -pneumococcal vaccine and tetanus booster -obtain and abstract labs (hgba1c) from gyn and health maintenance from gyn -labs -follow up:  1) flu shot visit in October 2) 3-4 months  It was so nice to meet you today. Please keep healing yourself!   We will place a referral for you as discussed to an oral surgeon. It usually takes about 1-2 weeks to process and schedule this referral. If you have not heard from us regarding this appointment in 2 weeks please contact our office.   We have ordered labs or studies at this visit. It can take up to 1-2 weeks for results and processing. IF results require follow up or explanation, we will call you with instructions. Clinically stable results will be [facility] to [facility]. If you have not heard from us or cannot find your results [facility] in 2 weeks please contact our office at [phone].  If you are not yet signed up for [facility], please consider signing up.

## 2018-07-21 ENCOUNTER — Encounter: Payer: Self-pay | Admitting: Family Medicine

## 2018-08-16 IMAGING — DX DG TIBIA/FIBULA 2V*L*
4 series · 4 of 4 positions shown · non-contrast
Comparison: 03/03/2017 .

CLINICAL DATA: Nonhealing wound.

EXAM:
LEFT TIBIA AND FIBULA - 2 VIEW

[tibia ap (1 of 2)]
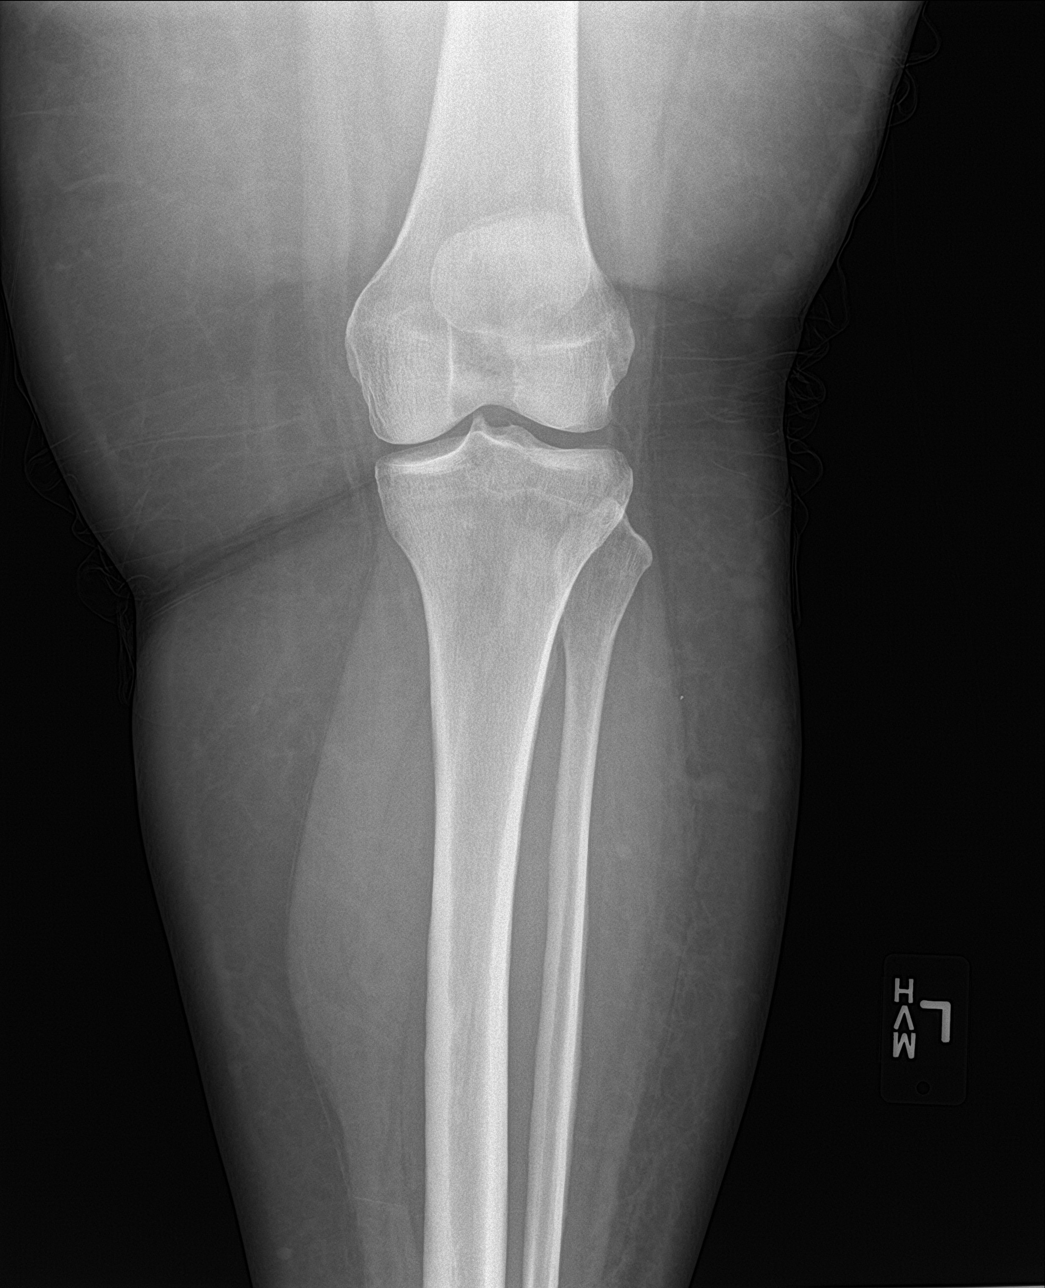

[tibia ap (2 of 2)]
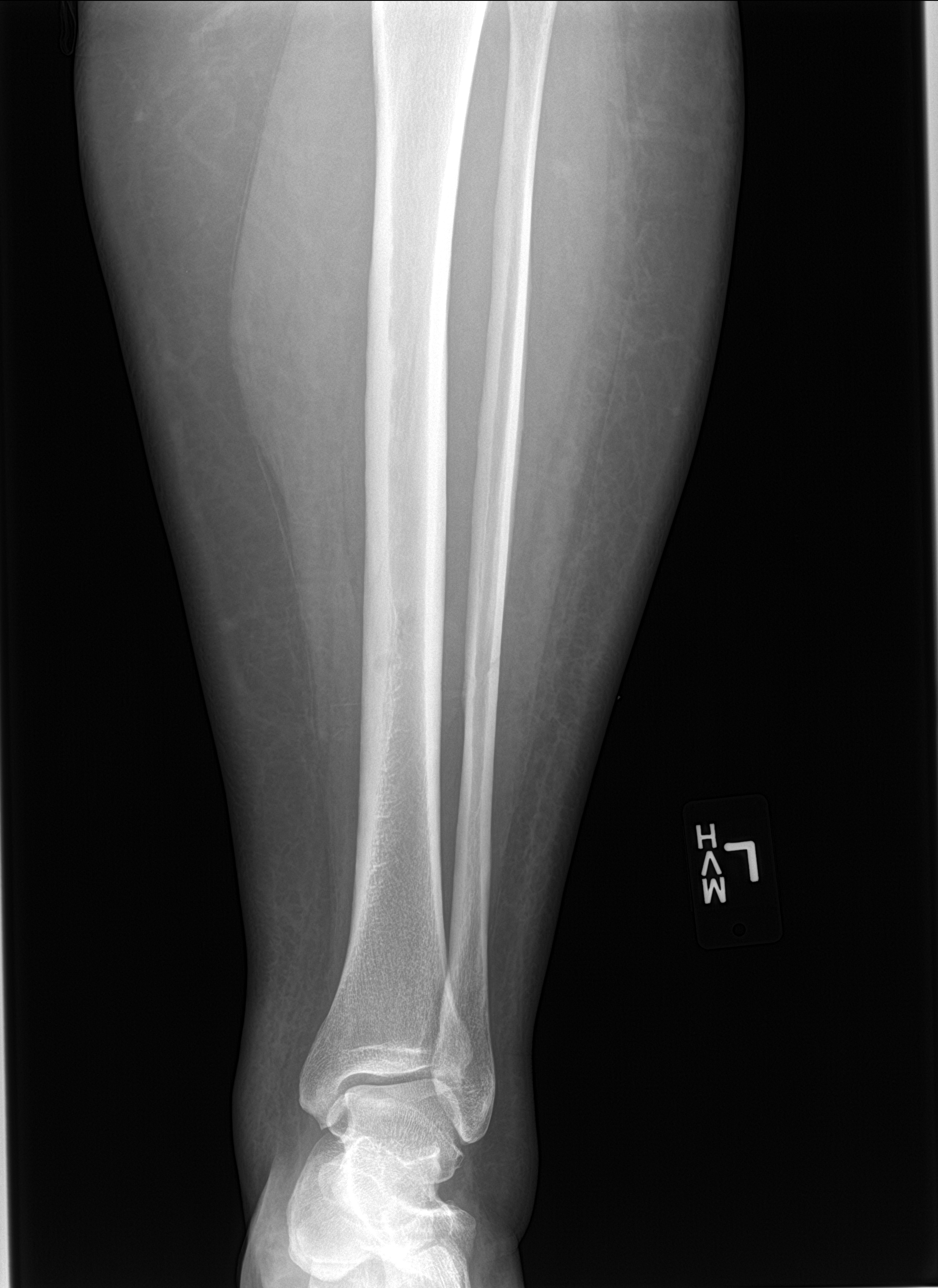

[tibia lat (1 of 2)]
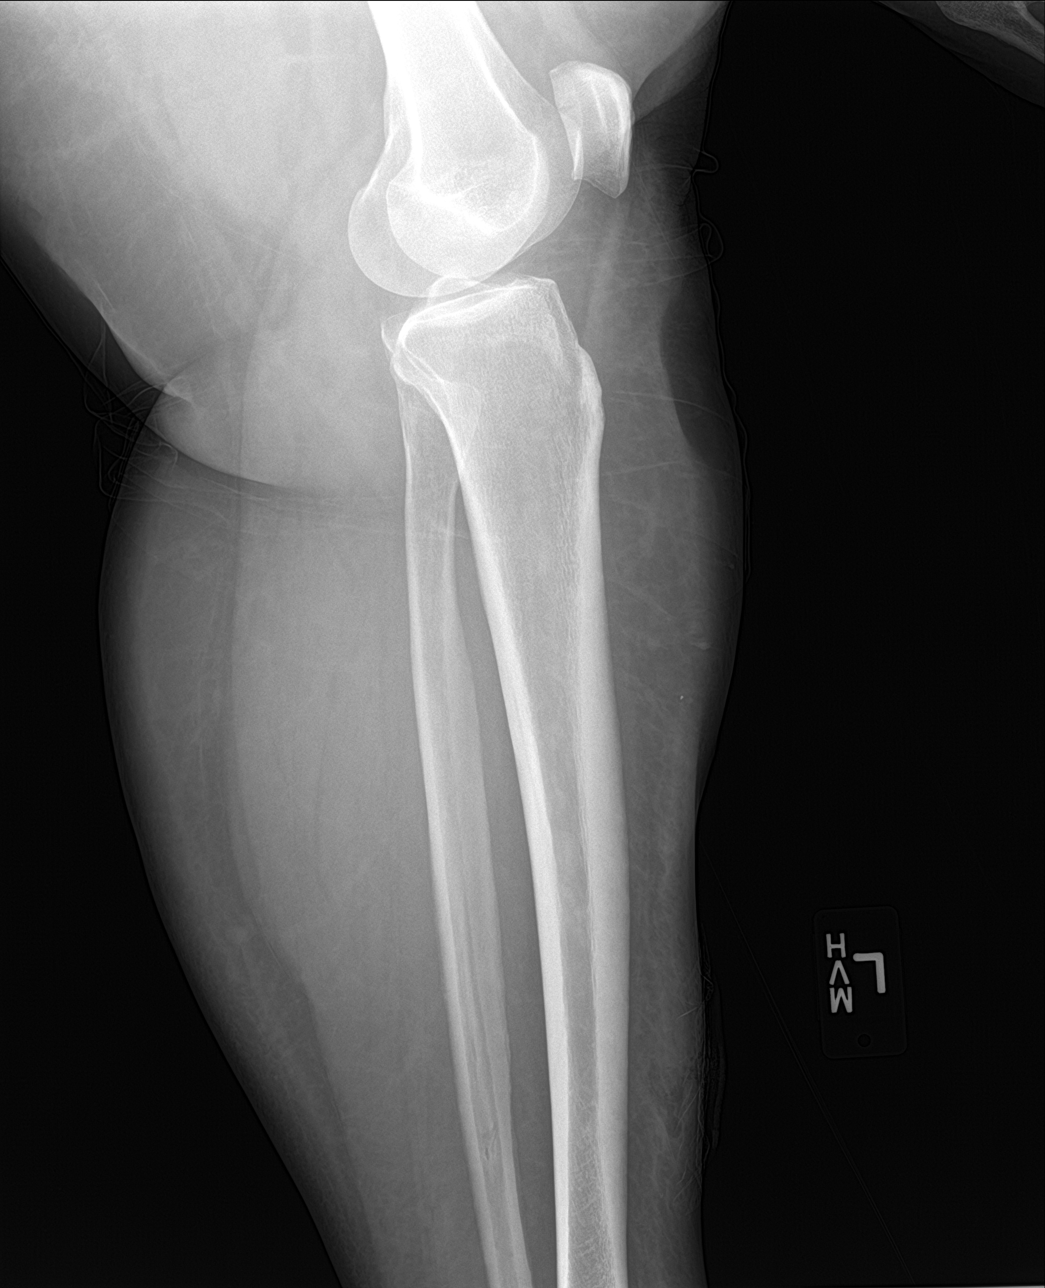

[tibia lat (2 of 2)]
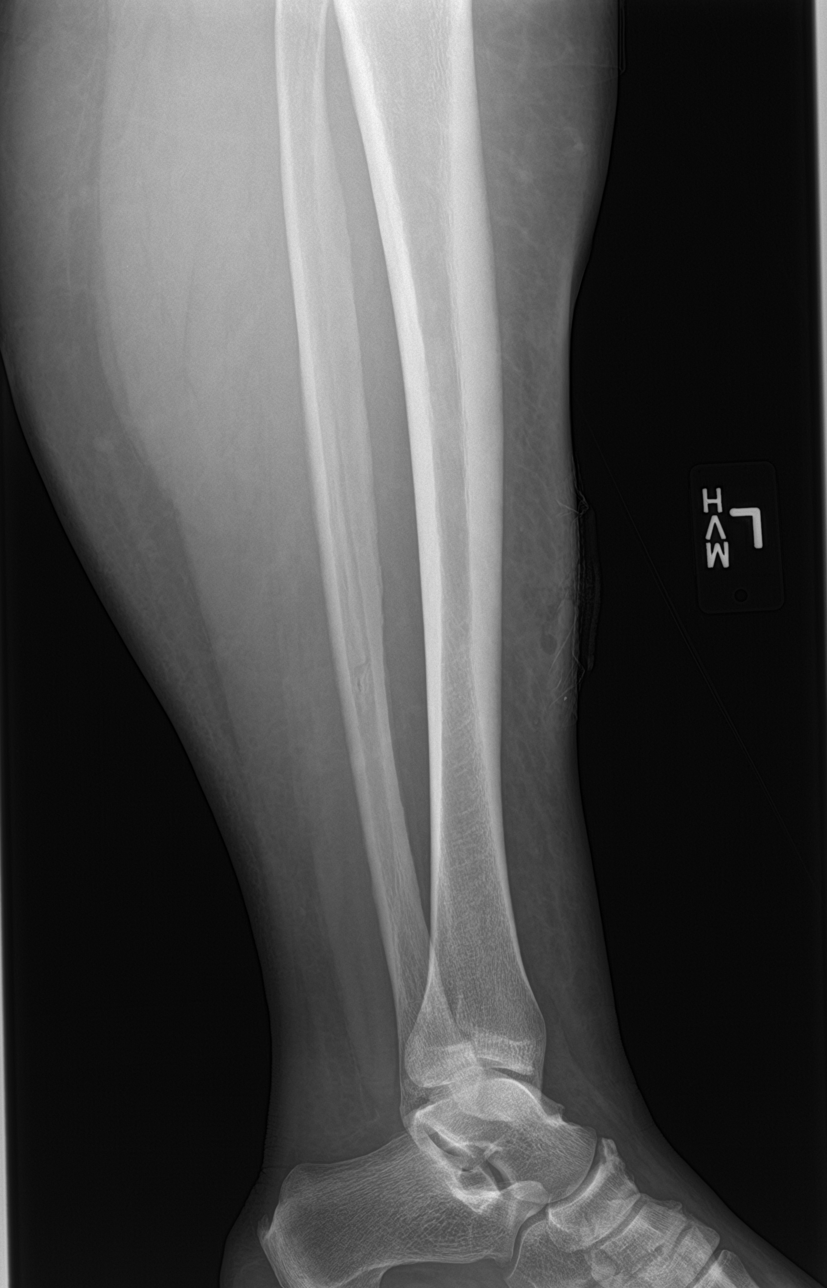

[4 of 4 positions shown; findings below may reference images not displayed]

FINDINGS: Soft tissue wound noted over the anterior soft tissues of the mid
tib-fib region. No radiopaque foreign body. No evidence of
osteomyelitis. Subtle nondisplaced fracture of the distal left
fibular shaft cannot be excluded .
IMPRESSION: 1. Soft tissue wound noted over the anterior soft tissues of the
left tib-fib region. No radiopaque foreign body. No evidence of
osteomyelitis. 2. 2. Subtle nondisplaced fracture of the distal left
fibular shaft cannot be excluded.

## 2018-08-25 ENCOUNTER — Ambulatory Visit (INDEPENDENT_AMBULATORY_CARE_PROVIDER_SITE_OTHER): Payer: No Typology Code available for payment source

## 2018-08-25 DIAGNOSIS — Z23 Encounter for immunization: Secondary | ICD-10-CM

## 2018-08-25 NOTE — Progress Notes (Signed)
Per orders of Dr. Selena Batten, injection of  Fluarix given by Sherrin Daisy. Patient tolerated injection well.

## 2018-09-07 ENCOUNTER — Encounter: Payer: Self-pay | Admitting: Family Medicine

## 2018-09-07 ENCOUNTER — Ambulatory Visit: Payer: No Typology Code available for payment source | Admitting: Family Medicine

## 2018-09-07 VITALS — BP 122/80 | HR 79 | Temp 97.8°F | Ht 65.0 in | Wt 263.8 lb

## 2018-09-07 DIAGNOSIS — M9901 Segmental and somatic dysfunction of cervical region: Secondary | ICD-10-CM | POA: Diagnosis not present

## 2018-09-07 DIAGNOSIS — M25551 Pain in right hip: Secondary | ICD-10-CM

## 2018-09-07 DIAGNOSIS — M9906 Segmental and somatic dysfunction of lower extremity: Secondary | ICD-10-CM

## 2018-09-07 DIAGNOSIS — M9905 Segmental and somatic dysfunction of pelvic region: Secondary | ICD-10-CM

## 2018-09-07 DIAGNOSIS — M9907 Segmental and somatic dysfunction of upper extremity: Secondary | ICD-10-CM

## 2018-09-07 DIAGNOSIS — M545 Low back pain: Secondary | ICD-10-CM

## 2018-09-07 DIAGNOSIS — M9902 Segmental and somatic dysfunction of thoracic region: Secondary | ICD-10-CM

## 2018-09-07 DIAGNOSIS — G8929 Other chronic pain: Secondary | ICD-10-CM

## 2018-09-07 DIAGNOSIS — M25512 Pain in left shoulder: Secondary | ICD-10-CM | POA: Diagnosis not present

## 2018-09-07 NOTE — Progress Notes (Signed)
HPI:  Using dictation device. Unfortunately this device frequently misinterprets words/phrases.  Acute visit for several orthopedic issues.  She has had intermittent left shoulder pain for 6 months.  Worse with abduction.  Now with some decreased range of motion.  Seems worse with internal rotation and extremes of extension. Moderate pain. She is a Research scientist (medical).  Does a lot of lifting and uses her shoulders a lot.  No radiation, weakness, numbness, fevers, malaise. She also has some chronic right hip pain.  Has had this for at least one year.  Intermittent.  Worse with lying on the right side.  Seem to have started when she lost a bunch of weight.  L ocated in the lateral right hip.  5 out of 10 pain.  No radiation, weakness, bowel or bladder dysfunction. Occ low back pain chronic. Past medical history is significant for neck fusion in 2012, right foot screws and hammertoe surgery, several falls off of horses, several falls downstairs, broken tailbone x2, broken R collar bone as a baby. Continues to get regular aerobic exercise and works out at [employer] on a regular basis.  ROS: See pertinent positives and negatives per HPI.  Past Medical History:  Diagnosis Date  . Anxiety    anxiety attacks are infrequent  . Asthma   . Chest tightness   . Diabetes (HCC)   . Macular degeneration    per patient-treated by Dr Dione Booze  . Meralgia paresthetica of left side 12/12/2015  . Obesity 09/27/2012  . Urinary frequency     Past Surgical History:  Procedure Laterality Date  . ABDOMINAL HYSTERECTOMY    . CESAREAN SECTION    . FOOT SURGERY    . HAMMERTOE RECONSTRUCTION WITH WEIL OSTEOTOMY Right 12/25/2015   Procedure: RIGHT THIRD HAMMERTOE CORRECTION, WEIL OSTEOTOMY;  Surgeon: Toni Arthurs, MD;  Location: [facility];  Service: Orthopedics;  Laterality: Right;  . HAND SURGERY    . HARDWARE REMOVAL Right 12/25/2015   Procedure: RIGHT FIFTH METATARSAL DEEP IMPLANT REMOVAL, EXOSTECTOMY;  Surgeon:  Toni Arthurs, MD;  Location: [facility];  Service: Orthopedics;  Laterality: Right;  . NECK SURGERY    . TONSILLECTOMY      Family History  Problem Relation Age of Onset  . Arrhythmia Mother        V TACH  . Ovarian cancer Mother   . Breast cancer Mother 24  . Heart attack Father 9  . Heart attack Maternal Grandfather   . Cancer Maternal Grandfather   . Heart disease Paternal Grandmother   . Sudden Cardiac Death Brother   . Heart attack Brother   . Breast cancer Maternal Aunt   . Breast cancer Maternal Aunt   . Breast cancer Cousin        maternal first cousin    SOCIAL HX: se ehpi   Current Outpatient Medications:  .  ALPHA LIPOIC ACID PO, Take 400 mg by mouth every morning., Disp: , Rfl:  .  Ascorbic Acid (VITAMIN C) 1000 MG tablet, Take 1,000 mg by mouth daily., Disp: , Rfl:  .  cetirizine (ZYRTEC) 10 MG tablet, Take 10 mg by mouth at bedtime., Disp: , Rfl:  .  Cholecalciferol (VITAMIN D3 PO), Take 2,000 Units by mouth daily., Disp: , Rfl:  .  loratadine (CLARITIN) 10 MG tablet, Take 10 mg by mouth every morning., Disp: , Rfl:  .  MAGNESIUM CITRATE PO, Take 100 mg by mouth daily., Disp: , Rfl:  .  Multiple Vitamin (  MULTIVITAMIN) tablet, Take 1 tablet by mouth daily., Disp: , Rfl:  .  Multiple Vitamins-Minerals (ZINC PO), Take 50 mg by mouth daily., Disp: , Rfl:  .  OVER THE COUNTER MEDICATION, Areds, Disp: , Rfl:  .  Potassium 99 MG TABS, Take by mouth daily., Disp: , Rfl:  .  SELENIUM PO, Take 200 mg by mouth daily., Disp: , Rfl:  .  VITAMIN A PO, Take by mouth., Disp: , Rfl:   EXAM:  Vitals:   09/07/18 1619  BP: 122/80  Pulse: 79  Temp: 97.8 F (36.6 C)    Body mass index is 43.9 kg/m.  GENERAL: vitals reviewed and listed above, alert, oriented, appears well hydrated and in no acute distress  HEENT: atraumatic, conjunttiva clear, no obvious abnormalities on inspection of external nose and ears  NECK: no obvious masses on inspection  MS:  moves all extremities without noticeable abnormality, R shoulder elevated, pes planus bilat, normal strength and sensitivity throughout in the upper and lower extremitie, normal LE DTRS, no rotator cuff TTP today, neg FABER, FADIR, SLRT, CLRT, T 5-9 RLSl, R ant innominate, L shoulder restricted in abd and ext, external rotation, R IT band TP, TI rot L, OA R R  PSYCH: pleasant and cooperative, no obvious depression or anxiety  ASSESSMENT AND PLAN:  Discussed the following assessment and plan:  Lateral pain of right hip  Chronic left shoulder pain  Chronic low back pain, unspecified back pain laterality, unspecified whether sciatica present  Somatic dysfunction of cervical region  Somatic dysfunction of thoracic region  Somatic dysfunction of upper extremity  Somatic dysfunction of pelvis region  Somatic dysfunction of right lower extremity  -we discussed possible serious and likely etiologies, workup and treatment, treatment risks and return precautions for the shoulder and hip issues.  Suspect she had initially rotator cuff issues that turned into a mild frozen shoulder.  Also suspect IT band pathology. -after this discussion, Ryann opted for OMM today (see below).  Home PT Exercises.  Symptomatic care for symptoms per patient instructions. -follow up advised 1 to 2 weeks -of course, we advised Fate  to return or notify a doctor immediately if symptoms worsen or persist or new concerns arise.  PROCEDURE NOTE : OSTEOPATHIC TREATMENT The decision today to treat with gentle Osteopathic Manipulative Therapy  (OMT) was based on physical exam findings, diagnoses and patient wishes. Verbal consent was obtained after after explanation of risks and benefits. No Cervical HVLA manipulation was performed. After consent was obtained, treatment was  performed as below:      Regions treated:  Head, thoracic, pelvis, upper extrem, lower extrem     Techniques used:  counterstrain, ME, MR. BLT The patient tolerated the treatment well and reported Improved  symptoms following treatment today. Follow up treatment was advised in: 1-3 weeks   Patient Instructions  BEFORE YOU LEAVE: -IT band exercises -rotator cuff exercises -follow up: OMM in 1-3 weeks  Heat or Ice.  Do the exercises 3 - 4 days per week.  Topical menthol (tiger balm ) if needed for pain.  I hope you are feeling better soon! Seek sooner if your symptoms worsen or new concerns arise.     Terressa Koyanagi, DO

## 2018-09-07 NOTE — Patient Instructions (Signed)
BEFORE YOU LEAVE: -IT band exercises -rotator cuff exercises -follow up: OMM in 1-3 weeks  Heat or Ice.  Do the exercises 3 - 4 days per week.  Topical menthol (tiger balm ) if needed for pain.  I hope you are feeling better soon! Seek sooner if your symptoms worsen or new concerns arise.

## 2018-09-26 ENCOUNTER — Ambulatory Visit: Payer: No Typology Code available for payment source | Admitting: Family Medicine

## 2018-09-26 ENCOUNTER — Encounter: Payer: Self-pay | Admitting: Family Medicine

## 2018-09-26 VITALS — BP 136/80 | HR 85 | Temp 98.5°F | Ht 65.0 in | Wt 264.1 lb

## 2018-09-26 DIAGNOSIS — M9906 Segmental and somatic dysfunction of lower extremity: Secondary | ICD-10-CM

## 2018-09-26 DIAGNOSIS — M9902 Segmental and somatic dysfunction of thoracic region: Secondary | ICD-10-CM

## 2018-09-26 DIAGNOSIS — M9905 Segmental and somatic dysfunction of pelvic region: Secondary | ICD-10-CM

## 2018-09-26 DIAGNOSIS — M25551 Pain in right hip: Secondary | ICD-10-CM

## 2018-09-26 DIAGNOSIS — M9907 Segmental and somatic dysfunction of upper extremity: Secondary | ICD-10-CM

## 2018-09-26 DIAGNOSIS — E119 Type 2 diabetes mellitus without complications: Secondary | ICD-10-CM

## 2018-09-26 DIAGNOSIS — R232 Flushing: Secondary | ICD-10-CM

## 2018-09-26 DIAGNOSIS — M9903 Segmental and somatic dysfunction of lumbar region: Secondary | ICD-10-CM

## 2018-09-26 NOTE — Progress Notes (Signed)
HPI:  Using dictation device. Unfortunately this device frequently misinterprets words/phrases.  Follow up Left shoulder pain and R hip pain.  See notes from 10/17/ 19 for details of hx of these issues. Reports shoulder pains have resolved. Took a trip to [place] and drove for 10 hours straight there and back.  Her right hip has been hurting since.  Pain level is 6 out of 10 in the lateral right hip today with at its worst with certain activities.  Feels better when up and about for a while.  She has been doing home exercises which helped.  No radiation, weakness or numbness reported. She plans to follow-up for diabetes in December.  Reports her blood sugar is 1 12 in the afternoon and has been good.  She is exercising and eating healthy.  She did rush here from work so her blood pressure was little elevated when she was first checked again, but better on recheck.  She thinks she might be perimenopausal as she has had some hot flashes from time to time.  These are not unbearable.   ROS: See pertinent positives and negatives per HPI.  Past Medical History:  Diagnosis Date  . Anxiety    anxiety attacks are infrequent  . Asthma   . Chest tightness   . Diabetes (HCC)   . Macular degeneration    per patient-treated by Dr Dione Booze  . Meralgia paresthetica of left side 12/12/2015  . Obesity 09/27/2012  . Urinary frequency     Past Surgical History:  Procedure Laterality Date  . ABDOMINAL HYSTERECTOMY    . CESAREAN SECTION    . FOOT SURGERY    . HAMMERTOE RECONSTRUCTION WITH WEIL OSTEOTOMY Right 12/25/2015   Procedure: RIGHT THIRD HAMMERTOE CORRECTION, WEIL OSTEOTOMY;  Surgeon: Toni Arthurs, MD;  Location: [facility];  Service: Orthopedics;  Laterality: Right;  . HAND SURGERY    . HARDWARE REMOVAL Right 12/25/2015   Procedure: RIGHT FIFTH METATARSAL DEEP IMPLANT REMOVAL, EXOSTECTOMY;  Surgeon: Toni Arthurs, MD;  Location: [facility];  Service: Orthopedics;   Laterality: Right;  . NECK SURGERY    . TONSILLECTOMY      Family History  Problem Relation Age of Onset  . Arrhythmia Mother        V TACH  . Ovarian cancer Mother   . Breast cancer Mother 34  . Heart attack Father 76  . Heart attack Maternal Grandfather   . Cancer Maternal Grandfather   . Heart disease Paternal Grandmother   . Sudden Cardiac Death Brother   . Heart attack Brother   . Breast cancer Maternal Aunt   . Breast cancer Maternal Aunt   . Breast cancer Cousin        maternal first cousin    SOCIAL HX: See HPI   Current Outpatient Medications:  .  ALPHA LIPOIC ACID PO, Take 400 mg by mouth every morning., Disp: , Rfl:  .  Ascorbic Acid (VITAMIN C) 1000 MG tablet, Take 1,000 mg by mouth daily., Disp: , Rfl:  .  cetirizine (ZYRTEC) 10 MG tablet, Take 10 mg by mouth at bedtime., Disp: , Rfl:  .  Cholecalciferol (VITAMIN D3 PO), Take 2,000 Units by mouth daily., Disp: , Rfl:  .  loratadine (CLARITIN) 10 MG tablet, Take 10 mg by mouth every morning., Disp: , Rfl:  .  MAGNESIUM CITRATE PO, Take 100 mg by mouth daily., Disp: , Rfl:  .  Multiple Vitamin (MULTIVITAMIN) tablet, Take 1 tablet by mouth  daily., Disp: , Rfl:  .  Multiple Vitamins-Minerals (ZINC PO), Take 50 mg by mouth daily., Disp: , Rfl:  .  OVER THE COUNTER MEDICATION, Areds, Disp: , Rfl:  .  Potassium 99 MG TABS, Take by mouth daily., Disp: , Rfl:  .  SELENIUM PO, Take 200 mg by mouth daily., Disp: , Rfl:  .  VITAMIN A PO, Take by mouth., Disp: , Rfl:   EXAM:  Vitals:   09/26/18 1626  BP: 136/80  Pulse: 85  Temp: 98.5 F (36.9 C)    Body mass index is 43.95 kg/m.  GENERAL: vitals reviewed and listed above, alert, oriented, appears well hydrated and in no acute distress  HEENT: atraumatic, conjunttiva clear, no obvious abnormalities on inspection of external nose and ears  NECK: no obvious masses on inspection  MS: moves all extremities without noticeable abnormality, normal gait, shoulders  are fairly well aligned, mild hip shoulders forward posture, mild thoracic rotation to the 4-6 rotated right side bent left, mild lumbar rotation L3-4 rotated left side bent right, right anterior innominate, R IT tenderpoint, TO rotated L, TI rot R, OA rot R, Rt shoulder restriction in abd, ext  PSYCH: pleasant and cooperative, no obvious depression or anxiety  ASSESSMENT AND PLAN:  Discussed the following assessment and plan:  Lateral pain of right hip  Hot flashes  Type 2 diabetes mellitus without complication, without long-term current use of insulin (HCC)  Morbid obesity (HCC)  -Decided to try more OMM for the hip today, and seems to help the shoulder, she also is doing home exercises which she will continue, see below -Continue healthy lifestyle -Recommended regular breaks when doing long trips -Discussed evaluation and management of hot flashes, symptoms tolerable at this time -Plans follow-up in December about the diabetes, follow-up as needed in the interim  PROCEDURE NOTE : OSTEOPATHIC TREATMENT The decision today to treat with gentle Osteopathic Manipulative Therapy  (OMT) was based on physical exam findings, diagnoses and patient wishes. Verbal consent was obtained after after explanation of risks and benefits. No Cervical HVLA manipulation was performed. After consent was obtained, treatment was  performed as below:      Regions treated:  LE, pelvis, lumbar, thor, UE     Techniques used: counterstrain, ME, MR, BLT The patient tolerated the treatment well and reported Improved  symptoms following treatment today. Follow up treatment was advised in: as needed   -Patient advised to return or notify a doctor immediately if symptoms worsen or persist or new concerns arise.  There are no Patient Instructions on file for this visit.  Terressa Koyanagi, DO

## 2018-10-26 ENCOUNTER — Ambulatory Visit: Payer: No Typology Code available for payment source | Admitting: Family Medicine

## 2018-10-26 ENCOUNTER — Encounter: Payer: Self-pay | Admitting: Family Medicine

## 2018-10-26 VITALS — BP 112/80 | HR 82 | Temp 98.0°F | Ht 65.0 in | Wt 266.9 lb

## 2018-10-26 DIAGNOSIS — L97529 Non-pressure chronic ulcer of other part of left foot with unspecified severity: Secondary | ICD-10-CM

## 2018-10-26 DIAGNOSIS — M8600 Acute hematogenous osteomyelitis, unspecified site: Secondary | ICD-10-CM

## 2018-10-26 DIAGNOSIS — E119 Type 2 diabetes mellitus without complications: Secondary | ICD-10-CM | POA: Diagnosis not present

## 2018-10-26 DIAGNOSIS — R300 Dysuria: Secondary | ICD-10-CM

## 2018-10-26 DIAGNOSIS — J069 Acute upper respiratory infection, unspecified: Secondary | ICD-10-CM

## 2018-10-26 DIAGNOSIS — I739 Peripheral vascular disease, unspecified: Secondary | ICD-10-CM

## 2018-10-26 DIAGNOSIS — E118 Type 2 diabetes mellitus with unspecified complications: Secondary | ICD-10-CM

## 2018-10-26 LAB — POCT URINALYSIS DIPSTICK
Bilirubin, UA: NEGATIVE
Blood, UA: NEGATIVE
Glucose, UA: NEGATIVE
Ketones, UA: NEGATIVE
Leukocytes, UA: NEGATIVE
Nitrite, UA: NEGATIVE
Protein, UA: NEGATIVE
Spec Grav, UA: >=1.03 — AB (ref 1.010–1.025)
Urobilinogen, UA: 0.2 E.U./dL
pH, UA: 5 (ref 5.0–8.0)

## 2018-10-26 LAB — MICROALBUMIN / CREATININE URINE RATIO
Creatinine,U: 122.8 mg/dL
Microalb Creat Ratio: 0.7 mg/g (ref 0.0–30.0)
Microalb, Ur: 0.8 mg/dL (ref 0.0–1.9)

## 2018-10-26 LAB — LIPID PANEL
CHOL/HDL RATIO: 3
Cholesterol: 211 mg/dL — ABNORMAL HIGH (ref 0–200)
HDL: 75.6 mg/dL (ref >39.00)
LDL CALC: 120 mg/dL — AB (ref 0–99)
NonHDL: 134.9
Triglycerides: 73 mg/dL (ref 0.0–149.0)
VLDL: 14.6 mg/dL (ref 0.0–40.0)

## 2018-10-26 LAB — HEMOGLOBIN A1C: HEMOGLOBIN A1C: 6.7 % — AB (ref 4.6–6.5)

## 2018-10-26 NOTE — Patient Instructions (Addendum)
BEFORE YOU LEAVE: -udip with reflex if needed - also needs urine micro/alb cr for the diabetes -labs -follow up: 3 months, sooner as needed  Start the very low dose of crestor as we discussed.  We have ordered labs or studies at this visit. It can take up to 1-2 weeks for results and processing. IF results require follow up or explanation, we will call you with instructions. Clinically stable results will be [facility] to [facility]. If you have not heard from us or cannot find your results in [facility] in 2 weeks please contact our office at [phone].  If you are not yet signed up for [facility], please consider signing up.  Keep up a healthy diet and regular exercise.  I hope you are feeling better soon! Seek care promptly if your symptoms worsen, new concerns arise or you are not improving with treatment.

## 2018-10-26 NOTE — Progress Notes (Signed)
HPI:  Using dictation device. Unfortunately this device frequently misinterprets words/phrases.  Lindsay Sanchez is a pleasant 47 y.o. here for follow up. Chronic medical problems summarized below were reviewed for changes and stability and were updated as needed below. These issues and their treatment remain stable for the most part. Reports doing well for the most part. Has a mild cold - pnd, mild sore throat today. Feels has UTI. Urinary frequency and urgency x 2 days. No hematuria, fevers, flank pain, NVD, abd pain. Shoulders and back doing better.  Denies CP, SOB, DOE, treatment intolerance or new symptoms. Due for labs/microalb cr; ? Statin, eye exam  Diabetes: -meds: did not tolerate a number of medications including metformin, glimepiride and other -working on lifestyle changes and doing much better  Morbid obesity: -working on low sugar healthy diet and regular exercise and has lost over 70 lbs and if feeling great  ROS: See pertinent positives and negatives per HPI.  Past Medical History:  Diagnosis Date  . Anxiety    anxiety attacks are infrequent  . Asthma   . Chest tightness   . Diabetes (HCC)   . Macular degeneration    per patient-treated by Dr Dione BoozeGroat  . Meralgia paresthetica of left side 12/12/2015  . Obesity 09/27/2012  . Urinary frequency     Past Surgical History:  Procedure Laterality Date  . ABDOMINAL HYSTERECTOMY    . CESAREAN SECTION    . FOOT SURGERY    . HAMMERTOE RECONSTRUCTION WITH WEIL OSTEOTOMY Right 12/25/2015   Procedure: RIGHT THIRD HAMMERTOE CORRECTION, WEIL OSTEOTOMY;  Surgeon: Toni ArthursJohn Hewitt, MD;  Location: [facility];  Service: Orthopedics;  Laterality: Right;  . HAND SURGERY    . HARDWARE REMOVAL Right 12/25/2015   Procedure: RIGHT FIFTH METATARSAL DEEP IMPLANT REMOVAL, EXOSTECTOMY;  Surgeon: Toni ArthursJohn Hewitt, MD;  Location: [facility];  Service: Orthopedics;  Laterality: Right;  . NECK SURGERY    . TONSILLECTOMY       Family History  Problem Relation Age of Onset  . Arrhythmia Mother        V TACH  . Ovarian cancer Mother   . Breast cancer Mother [id number]  . Heart attack Father [phone]  . Heart attack Maternal Grandfather   . Cancer Maternal Grandfather   . Heart disease Paternal Grandmother   . Sudden Cardiac Death Brother   . Heart attack Brother   . Breast cancer Maternal Aunt   . Breast cancer Maternal Aunt   . Breast cancer Cousin        maternal first cousin    SOCIAL HX: see hpi   Current Outpatient Medications:  .  ALPHA LIPOIC ACID PO, Take 400 mg by mouth every morning., Disp: , Rfl:  .  Ascorbic Acid (VITAMIN C) 1000 MG tablet, Take 1,000 mg by mouth daily., Disp: , Rfl:  .  cetirizine (ZYRTEC) 10 MG tablet, Take 10 mg by mouth at bedtime., Disp: , Rfl:  .  Cholecalciferol (VITAMIN D3 PO), Take 2,000 Units by mouth daily., Disp: , Rfl:  .  loratadine (CLARITIN) 10 MG tablet, Take 10 mg by mouth every morning., Disp: , Rfl:  .  MAGNESIUM CITRATE PO, Take 100 mg by mouth daily., Disp: , Rfl:  .  Multiple Vitamin (MULTIVITAMIN) tablet, Take 1 tablet by mouth daily., Disp: , Rfl:  .  Multiple Vitamins-Minerals (ZINC PO), Take 50 mg by mouth daily., Disp: , Rfl:  .  OVER THE COUNTER MEDICATION, Areds, Disp: , Rfl:  .  Potassium 99 MG TABS, Take by mouth daily., Disp: , Rfl:  .  SELENIUM PO, Take 200 mg by mouth daily., Disp: , Rfl:  .  VITAMIN A PO, Take by mouth., Disp: , Rfl:   EXAM:  Vitals:   10/26/18 1435  BP: 112/80  Pulse: 82  Temp: 98 F (36.7 C)    Body mass index is 44.41 kg/m.  GENERAL: vitals reviewed and listed above, alert, oriented, appears well hydrated and in no acute distress  HEENT: atraumatic, conjunttiva clear, no obvious abnormalities on inspection of external nose and ears, normal appearance of ear canals and TMs, clear nasal congestion, mild post oropharyngeal erythema with PND, no tonsillar edema or exudate, no sinus TTP  NECK: no obvious masses on  inspection  LUNGS: clear to auscultation bilaterally, no wheezes, rales or rhonchi, good air movement  CV: HRRR, no peripheral edema  ABD: no cva ttp  MS: moves all extremities without noticeable abnormality  PSYCH: pleasant and cooperative, no obvious depression or anxiety  ASSESSMENT AND PLAN:  Discussed the following assessment and plan:  Type 2 diabetes mellitus without complication, without long-term current use of insulin (HCC) - Plan: Hemoglobin A1c, Microalbumin / creatinine urine ratio  Morbid obesity (HCC) - Plan: Lipid panel  Dysuria - Plan: POC Urinalysis Dipstick  Viral upper respiratory illness  -symptomatic care for mild ur symptoms and follow up as needed if worsen or persist -udip and labs per orders -cont current healthy diet and regular exercise -discussed risks/benefits statin/asa in diabetes - she agreed to start low dose statin a few days per week and consider stopping asa -follow up 3-4 months, sooner as needed -Patient advised to return or notify a doctor immediately if symptoms worsen or persist or new concerns arise.  Patient Instructions  BEFORE YOU LEAVE: -udip with reflex if needed - also needs urine micro/alb cr for the diabetes -labs -follow up: 3 months, sooner as needed  Start the very low dose of crestor as we discussed.  We have ordered labs or studies at this visit. It can take up to 1-2 weeks for results and processing. IF results require follow up or explanation, we will call you with instructions. Clinically stable results will be released to your [facility]. If you have not heard from [place] or cannot find your results in [facility] in 2 weeks please contact our office at [phone].  If you are not yet signed up for [facility], please consider signing up.  Keep up a healthy diet and regular exercise.  I hope you are feeling better soon! Seek care promptly if your symptoms worsen, new concerns arise or you are not improving with  treatment.           Terressa Koyanagi, DO

## 2018-10-31 ENCOUNTER — Other Ambulatory Visit: Payer: Self-pay | Admitting: Family Medicine

## 2018-10-31 MED ORDER — ROSUVASTATIN CALCIUM 5 MG PO TABS: 5.0000 mg | ORAL_TABLET | Freq: Every day | ORAL | 3 refills | Status: DC

## 2019-01-25 ENCOUNTER — Ambulatory Visit: Payer: No Typology Code available for payment source | Admitting: Family Medicine

## 2019-01-25 ENCOUNTER — Encounter: Payer: Self-pay | Admitting: Family Medicine

## 2019-01-25 VITALS — BP 136/80 | HR 85 | Temp 98.1°F | Ht 65.0 in | Wt 255.1 lb

## 2019-01-25 DIAGNOSIS — Z1211 Encounter for screening for malignant neoplasm of colon: Secondary | ICD-10-CM | POA: Diagnosis not present

## 2019-01-25 DIAGNOSIS — E119 Type 2 diabetes mellitus without complications: Secondary | ICD-10-CM | POA: Diagnosis not present

## 2019-01-25 LAB — HEMOGLOBIN A1C: Hgb A1c MFr Bld: 6.9 % — ABNORMAL HIGH (ref 4.6–6.5)

## 2019-01-25 NOTE — Progress Notes (Signed)
HPI:  Using dictation device. Unfortunately this device frequently misinterprets words/phrases.  Lindsay Sanchez is a pleasant 48 y.o. here for follow up. Chronic medical problems summarized below were reviewed for changes and stability and were updated as needed below. These issues and their treatment remain stable for the most part.  Reports is doing well. Mild cold for a few days without SOB, fevers, body aches, any recent travel. Diet not as good over the holidays. Now better. Wants to check diabetes lab.  Diabetes: -meds: did not tolerate a number of medications including metformin, glimepiride and other -working on lifestyle changes and doing much better  Morbid obesity: -working on low sugar healthy diet and regular exercise and has lost over 70 lbs and if feeling great  ROS: See pertinent positives and negatives per HPI.  Past Medical History:  Diagnosis Date  . Anxiety    anxiety attacks are infrequent  . Asthma   . Chest tightness   . Diabetes (HCC)   . Macular degeneration    per patient-treated by Dr Dione Booze  . Meralgia paresthetica of left side 12/12/2015  . Obesity 09/27/2012  . Urinary frequency     Past Surgical History:  Procedure Laterality Date  . ABDOMINAL HYSTERECTOMY    . CESAREAN SECTION    . FOOT SURGERY    . HAMMERTOE RECONSTRUCTION WITH WEIL OSTEOTOMY Right 12/25/2015   Procedure: RIGHT THIRD HAMMERTOE CORRECTION, WEIL OSTEOTOMY;  Surgeon: Toni Arthurs, MD;  Location: [facility];  Service: Orthopedics;  Laterality: Right;  . HAND SURGERY    . HARDWARE REMOVAL Right 12/25/2015   Procedure: RIGHT FIFTH METATARSAL DEEP IMPLANT REMOVAL, EXOSTECTOMY;  Surgeon: Toni Arthurs, MD;  Location: [facility];  Service: Orthopedics;  Laterality: Right;  . NECK SURGERY    . TONSILLECTOMY      Family History  Problem Relation Age of Onset  . Arrhythmia Mother        V TACH  . Ovarian cancer Mother   . Breast cancer Mother 12  .  Heart attack Father 76  . Heart attack Maternal Grandfather   . Cancer Maternal Grandfather   . Heart disease Paternal Grandmother   . Sudden Cardiac Death Brother   . Heart attack Brother   . Breast cancer Maternal Aunt   . Breast cancer Maternal Aunt   . Breast cancer Cousin        maternal first cousin    SOCIAL HX: see hpi   Current Outpatient Medications:  .  ALPHA LIPOIC ACID PO, Take 400 mg by mouth every morning., Disp: , Rfl:  .  Ascorbic Acid (VITAMIN C) 1000 MG tablet, Take 1,000 mg by mouth daily., Disp: , Rfl:  .  cetirizine (ZYRTEC) 10 MG tablet, Take 10 mg by mouth at bedtime., Disp: , Rfl:  .  Cholecalciferol (VITAMIN D3 PO), Take 2,000 Units by mouth daily., Disp: , Rfl:  .  loratadine (CLARITIN) 10 MG tablet, Take 10 mg by mouth every morning., Disp: , Rfl:  .  MAGNESIUM CITRATE PO, Take 100 mg by mouth daily., Disp: , Rfl:  .  Multiple Vitamin (MULTIVITAMIN) tablet, Take 1 tablet by mouth daily., Disp: , Rfl:  .  Multiple Vitamins-Minerals (ZINC PO), Take 50 mg by mouth daily., Disp: , Rfl:  .  OVER THE COUNTER MEDICATION, Areds, Disp: , Rfl:  .  Potassium 99 MG TABS, Take by mouth daily., Disp: , Rfl:  .  rosuvastatin (CRESTOR) 5 MG tablet, Take 1 tablet (  5 mg total) by mouth daily., Disp: 90 tablet, Rfl: 3 .  SELENIUM PO, Take 200 mg by mouth daily., Disp: , Rfl:  .  VITAMIN A PO, Take by mouth., Disp: , Rfl:   EXAM:  Vitals:   01/25/19 1444  BP: 136/80  Pulse: 85  Temp: 98.1 F (36.7 C)  SpO2: 98%    Body mass index is 42.45 kg/m.  GENERAL: vitals reviewed and listed above, alert, oriented, appears well hydrated and in no acute distress  HEENT: atraumatic, conjunttiva clear, no obvious abnormalities on inspection of external nose and ears  NECK: no obvious masses on inspection  LUNGS: clear to auscultation bilaterally, no wheezes, rales or rhonchi, good air movement  CV: HRRR, no peripheral edema  MS: moves all extremities without noticeable  abnormality  PSYCH: pleasant and cooperative, no obvious depression or anxiety  ASSESSMENT AND PLAN:  Discussed the following assessment and plan:  Type 2 diabetes mellitus without complication, without long-term current use of insulin (HCC) - Plan: Hemoglobin A1c  Morbid obesity (HCC)  Colon cancer screening - Plan: Ambulatory referral to Gastroenterology  -labs symptomatic care cold and advised prompt eval if worsening, new concerns or not improving as expected -lifestyle recs below -advised colon ca screening, she wants to do colonoscopy, referral placed -advised mammogram, she plans to call herself to schedule -Patient advised to return or notify a doctor immediately if symptoms worsen or persist or new concerns arise.  Patient Instructions  BEFORE YOU LEAVE: -lab for diabetes check -follow up: 3-4 months   -We placed a referral for you as discussed for your colon cancer screening. It usually takes about 1-2 weeks to process and schedule this referral. If you have not heard from [place] regarding this appointment in 2 weeks please contact our office.  We have ordered labs or studies at this visit. It can take up to 1-2 weeks for results and processing. IF results require follow up or explanation, we will call you with instructions. Clinically stable results will be released to your [facility]. If you have not heard from [place] or cannot find your results in [facility] in 2 weeks please contact our office at [phone].  If you are not yet signed up for [facility], please consider signing up.   We recommend the following healthy lifestyle for LIFE: 1) Small portions. But, make sure to get regular (at least 3 per day), healthy meals and small healthy snacks if needed.  2) Eat a healthy clean diet.   TRY TO EAT: -at least 5-7 servings of low sugar, colorful, and nutrient rich vegetables per day (not corn, potatoes or bananas.) -berries are the best choice if you wish to eat fruit (only eat  small amounts if trying to reduce weight)  -lean meets (fish, white meat of chicken or [place]) -vegan proteins for some meals - beans or tofu, whole grains, nuts and seeds -Replace bad fats with good fats - good fats include: fish, nuts and seeds, canola oil, olive oil -small amounts of low fat or non fat dairy -small amounts of100 % whole grains - check the lables -drink plenty of water  AVOID: -SUGAR, sweets, anything with added sugar, corn syrup or sweeteners - must read labels as even foods advertised as "healthy" often are loaded with sugar -if you must have a sweetener, small amounts of stevia may be best -sweetened beverages and artificially sweetened beverages -simple starches (rice, bread, potatoes, pasta, chips, etc - small amounts of 100% whole grains are ok) -  red meat, pork, butter -fried foods, fast food, processed food, excessive dairy, eggs and coconut.  3)Get at least 150 minutes of sweaty aerobic exercise per week.  4)Reduce stress - consider counseling, meditation and relaxation to balance other aspects of your life.        Terressa KoyanagiHannah R Kim, DO

## 2019-01-25 NOTE — Patient Instructions (Signed)
BEFORE YOU LEAVE: -lab for diabetes check -follow up: 3-4 months   -We placed a referral for you as discussed for your colon cancer screening. It usually takes about 1-2 weeks to process and schedule this referral. If you have not heard from [place] regarding this appointment in 2 weeks please contact our office.  We have ordered labs or studies at this visit. It can take up to 1-2 weeks for results and processing. IF results require follow up or explanation, we will call you with instructions. Clinically stable results will be released to your [facility]. If you have not heard from [place] or cannot find your results in [facility] in 2 weeks please contact our office at [phone].  If you are not yet signed up for [facility], please consider signing up.   We recommend the following healthy lifestyle for LIFE: 1) Small portions. But, make sure to get regular (at least 3 per day), healthy meals and small healthy snacks if needed.  2) Eat a healthy clean diet.   TRY TO EAT: -at least 5-7 servings of low sugar, colorful, and nutrient rich vegetables per day (not corn, potatoes or bananas.) -berries are the best choice if you wish to eat fruit (only eat small amounts if trying to reduce weight)  -lean meets (fish, white meat of chicken or [place]) -vegan proteins for some meals - beans or tofu, whole grains, nuts and seeds -Replace bad fats with good fats - good fats include: fish, nuts and seeds, canola oil, olive oil -small amounts of low fat or non fat dairy -small amounts of100 % whole grains - check the lables -drink plenty of water  AVOID: -SUGAR, sweets, anything with added sugar, corn syrup or sweeteners - must read labels as even foods advertised as "healthy" often are loaded with sugar -if you must have a sweetener, small amounts of stevia may be best -sweetened beverages and artificially sweetened beverages -simple starches (rice, bread, potatoes, pasta, chips, etc - small amounts of 100% whole  grains are ok) -red meat, pork, butter -fried foods, fast food, processed food, excessive dairy, eggs and coconut.  3)Get at least 150 minutes of sweaty aerobic exercise per week.  4)Reduce stress - consider counseling, meditation and relaxation to balance other aspects of your life.

## 2019-02-20 ENCOUNTER — Telehealth: Payer: Self-pay | Admitting: Family Medicine

## 2019-02-20 ENCOUNTER — Ambulatory Visit (INDEPENDENT_AMBULATORY_CARE_PROVIDER_SITE_OTHER): Payer: No Typology Code available for payment source | Admitting: Family Medicine

## 2019-02-20 ENCOUNTER — Other Ambulatory Visit: Payer: Self-pay

## 2019-02-20 DIAGNOSIS — R05 Cough: Secondary | ICD-10-CM

## 2019-02-20 DIAGNOSIS — J4521 Mild intermittent asthma with (acute) exacerbation: Secondary | ICD-10-CM | POA: Diagnosis not present

## 2019-02-20 DIAGNOSIS — T7840XA Allergy, unspecified, initial encounter: Secondary | ICD-10-CM

## 2019-02-20 DIAGNOSIS — R059 Cough, unspecified: Secondary | ICD-10-CM

## 2019-02-20 MED ORDER — ALBUTEROL SULFATE HFA 108 (90 BASE) MCG/ACT IN AERS: 2.0000 | INHALATION_SPRAY | Freq: Four times a day (QID) | RESPIRATORY_TRACT | 0 refills | Status: AC | PRN

## 2019-02-20 MED ORDER — BENZONATATE 100 MG PO CAPS: 100.0000 mg | ORAL_CAPSULE | Freq: Three times a day (TID) | ORAL | 0 refills | Status: DC | PRN

## 2019-02-20 NOTE — Telephone Encounter (Signed)
Copied from CRM [id number]. Topic: Quick Communication - See Telephone Encounter >> Feb 20, 2019  8:50 AM Jolayne Haines L wrote: CRM for notification. See Telephone encounter for: 02/20/19.  Patient said she did a online visit with [employer] on 3/20 and was diagnosed with an upper respiratory infection/bronchitis. They gave her Levaquin 250mg  or 500mg  (she said she can't remember which mg it is) and she will finish this on Friday. She said that she can not get over the chest congestion part of it, the head congestion has subsided. She said they advised her it sounds like she needs a chest xray since she is advising them that her right side lung is hurting. Please Advise.

## 2019-02-20 NOTE — Telephone Encounter (Signed)
Please schedule webx.

## 2019-02-20 NOTE — Progress Notes (Signed)
Virtual Visit via Video Note  I connected with Lindsay Sanchez on 02/20/19 at  1:30 PM EDT by a video enabled telemedicine application and verified that I am speaking with the correct person using two identifiers.  Location patient: home Location provider:work or home office Persons participating in the virtual visit: patient, provider  I discussed the limitations of evaluation and management by telemedicine and the availability of in person appointments. The patient expressed understanding and agreed to proceed.   HPI:  Acute visit for cough: -started 1 month ago with mild upper resp/sinus issues, but then worsened about 2 weeks ago -symptoms are nasal congestion, pnd, sneezing, cough, watery itchy eyes, sometimes chest tightness when coughing -she did online visit with insurance company 2 weeks ago -did 2 week course of levaquin - finishing up -sinus and and head better -no fevers,  coughing up mucus - green and thickinitially - now lightening -no SOB or wheezing, body aches, NV -albuterol helps has only needed a few times -has not been around anyone - has not been out at all; no travel or COVID19 exposure -uses antihistamine and rrare albuterol - tends to have a lot of allergy symptoms sometimes mild asthma symptoms in the spring, allergies are bad now  ROS: See pertinent positives and negatives per HPI.  Past Medical History:  Diagnosis Date  . Anxiety    anxiety attacks are infrequent  . Asthma   . Chest tightness   . Diabetes (HCC)   . Macular degeneration    per patient-treated by Dr Dione Booze  . Meralgia paresthetica of left side 12/12/2015  . Obesity 09/27/2012  . Urinary frequency     Past Surgical History:  Procedure Laterality Date  . ABDOMINAL HYSTERECTOMY    . CESAREAN SECTION    . FOOT SURGERY    . HAMMERTOE RECONSTRUCTION WITH WEIL OSTEOTOMY Right 12/25/2015   Procedure: RIGHT THIRD HAMMERTOE CORRECTION, WEIL OSTEOTOMY;  Surgeon: Toni Arthurs, MD;  Location: [facility];  Service: Orthopedics;  Laterality: Right;  . HAND SURGERY    . HARDWARE REMOVAL Right 12/25/2015   Procedure: RIGHT FIFTH METATARSAL DEEP IMPLANT REMOVAL, EXOSTECTOMY;  Surgeon: Toni Arthurs, MD;  Location: [facility];  Service: Orthopedics;  Laterality: Right;  . NECK SURGERY    . TONSILLECTOMY      Family History  Problem Relation Age of Onset  . Arrhythmia Mother        V TACH  . Ovarian cancer Mother   . Breast cancer Mother 23  . Heart attack Father 58  . Heart attack Maternal Grandfather   . Cancer Maternal Grandfather   . Heart disease Paternal Grandmother   . Sudden Cardiac Death Brother   . Heart attack Brother   . Breast cancer Maternal Aunt   . Breast cancer Maternal Aunt   . Breast cancer Cousin        maternal first cousin    SOCIAL HX: see hpi   Current Outpatient Medications:  .  albuterol (PROVENTIL HFA;VENTOLIN HFA) 108 (90 Base) MCG/ACT inhaler, Inhale 2 puffs into the lungs every 6 (six) hours as needed., Disp: 1 Inhaler, Rfl: 0 .  ALPHA LIPOIC ACID PO, Take 400 mg by mouth every morning., Disp: , Rfl:  .  Ascorbic Acid (VITAMIN C) 1000 MG tablet, Take 1,000 mg by mouth daily., Disp: , Rfl:  .  benzonatate (TESSALON PERLES) 100 MG capsule, Take 1 capsule (100 mg total) by mouth 3 (three) times daily as needed., Disp: 20  capsule, Rfl: 0 .  cetirizine (ZYRTEC) 10 MG tablet, Take 10 mg by mouth at bedtime., Disp: , Rfl:  .  Cholecalciferol (VITAMIN D3 PO), Take 2,000 Units by mouth daily., Disp: , Rfl:  .  loratadine (CLARITIN) 10 MG tablet, Take 10 mg by mouth every morning., Disp: , Rfl:  .  MAGNESIUM CITRATE PO, Take 100 mg by mouth daily., Disp: , Rfl:  .  Multiple Vitamin (MULTIVITAMIN) tablet, Take 1 tablet by mouth daily., Disp: , Rfl:  .  Multiple Vitamins-Minerals (ZINC PO), Take 50 mg by mouth daily., Disp: , Rfl:  .  OVER THE COUNTER MEDICATION, Areds, Disp: , Rfl:  .  Potassium 99 MG TABS, Take by mouth daily., Disp: ,  Rfl:  .  rosuvastatin (CRESTOR) 5 MG tablet, Take 1 tablet (5 mg total) by mouth daily., Disp: 90 tablet, Rfl: 3 .  SELENIUM PO, Take 200 mg by mouth daily., Disp: , Rfl:  .  VITAMIN A PO, Take by mouth., Disp: , Rfl:   EXAM:  VITALS per patient if applicable: 97.4 (normal for her)  GENERAL: alert, oriented, appears well and in no acute distress  HEENT: atraumatic, conjunttiva clear, no obvious abnormalities on inspection of external nose and ears  NECK: normal movements of the head and neck  LUNGS: on inspection no signs of respiratory distress, breathing rate appears normal, no obvious gross SOB, gasping or wheezing  CV: no obvious cyanosis  MS: moves all visible extremities without noticeable abnormality  PSYCH/NEURO: pleasant and cooperative, no obvious depression or anxiety, speech and thought processing grossly intact  ASSESSMENT AND PLAN:  Discussed the following assessment and plan:  Cough  Mild intermittent asthma with acute exacerbation  Allergic state, initial encounter  -we discussed possible serious and likely etiologies, workup and treatment, treatment risks and return precautions ; suspect allergic or viral rhinitis with milds asthma exacerbation/pst viral cough most likely vs other; has been treated with heavy duty abx and has no SOB, difficulty breathing or fevers -after this discussion, Lindsay Sanchez opted for add flonase, alb as needed, cough medication -follow up advised in 2 days to recheck -of course, we advised Lindsay Sanchez  to return or notify a doctor immediately if symptoms worsen or persist or new concerns arise.    I discussed the assessment and treatment plan with the patient. The patient was provided an opportunity to ask questions and all were answered. The patient agreed with the plan and demonstrated an understanding of the instructions.   The patient was advised to call back or seek an in-person evaluation if the symptoms worsen or if the condition  fails to improve as anticipated.   Follow up instructions: Advised assistant Ronnald Collum to help patient arrange the following: -schedule webx follow up with Dr. Selena Batten on Thursday   Terressa Koyanagi, DO   Patient Instructions  Start flonase 2 sprays each nostril daily for 21 days  Albuterol as needed for excessive cough, tightness or wheezing per instructions  Tessalon as needed for cough  Continue the allergy medications  Follow up in 2 days  I hope you are feeling better soon! Seek care promptly if your symptoms worsen, new concerns arise or you are not improving with treatment.

## 2019-02-20 NOTE — Telephone Encounter (Signed)
Please put her at 1:15 if possible? If not, at the end of my day? My meeting got canceled! Thanks!

## 2019-02-20 NOTE — Patient Instructions (Signed)
Start flonase 2 sprays each nostril daily for 21 days  Albuterol as needed for excessive cough, tightness or wheezing per instructions  Tessalon as needed for cough  Continue the allergy medications  Follow up in 2 days  I hope you are feeling better soon! Seek care promptly if your symptoms worsen, new concerns arise or you are not improving with treatment.

## 2019-02-20 NOTE — Telephone Encounter (Signed)
I called the pt and the Webex appt was arranged.

## 2019-02-22 ENCOUNTER — Other Ambulatory Visit: Payer: Self-pay

## 2019-02-22 ENCOUNTER — Encounter: Payer: Self-pay | Admitting: Family Medicine

## 2019-02-22 ENCOUNTER — Ambulatory Visit (INDEPENDENT_AMBULATORY_CARE_PROVIDER_SITE_OTHER): Payer: No Typology Code available for payment source | Admitting: Family Medicine

## 2019-02-22 VITALS — Temp 97.6°F

## 2019-02-22 DIAGNOSIS — J329 Chronic sinusitis, unspecified: Secondary | ICD-10-CM | POA: Diagnosis not present

## 2019-02-22 DIAGNOSIS — R059 Cough, unspecified: Secondary | ICD-10-CM

## 2019-02-22 DIAGNOSIS — J40 Bronchitis, not specified as acute or chronic: Secondary | ICD-10-CM

## 2019-02-22 DIAGNOSIS — R05 Cough: Secondary | ICD-10-CM

## 2019-02-22 DIAGNOSIS — J310 Chronic rhinitis: Secondary | ICD-10-CM

## 2019-02-22 NOTE — Progress Notes (Signed)
Virtual Visit via Video Note  I connected with Lindsay Sanchez on 02/22/19 at 10:00 AM EDT by a video enabled telemedicine application and verified that I am speaking with the correct person using two identifiers. The video portion of this visit did not work properly, but the visit was completed successfully with audio only.  Location patient: home Location provider:work or home office Persons participating in the virtual visit: patient, provider  I discussed the limitations of evaluation and management by telemedicine and the availability of in person appointments. The patient expressed understanding and agreed to proceed.   HPI:  Follow up cough: -seen 02/20/19 - see details present illness, notes from that OV reviewed today -today reports: doing much better, has used the inhaler twice daily and seems to really help, doing flonase, the cough medication has not been needed -denies: sob, CP, fevers, excessive cough - only in the morning  ROS: See pertinent positives and negatives per HPI.  Past Medical History:  Diagnosis Date  . Anxiety    anxiety attacks are infrequent  . Asthma   . Chest tightness   . Diabetes (HCC)   . Macular degeneration    per patient-treated by Dr Dione Booze  . Meralgia paresthetica of left side 12/12/2015  . Obesity 09/27/2012  . Urinary frequency     Past Surgical History:  Procedure Laterality Date  . ABDOMINAL HYSTERECTOMY    . CESAREAN SECTION    . FOOT SURGERY    . HAMMERTOE RECONSTRUCTION WITH WEIL OSTEOTOMY Right 12/25/2015   Procedure: RIGHT THIRD HAMMERTOE CORRECTION, WEIL OSTEOTOMY;  Surgeon: Toni Arthurs, MD;  Location: [facility];  Service: Orthopedics;  Laterality: Right;  . HAND SURGERY    . HARDWARE REMOVAL Right 12/25/2015   Procedure: RIGHT FIFTH METATARSAL DEEP IMPLANT REMOVAL, EXOSTECTOMY;  Surgeon: Toni Arthurs, MD;  Location: [facility];  Service: Orthopedics;  Laterality: Right;  . NECK SURGERY    .  TONSILLECTOMY      Family History  Problem Relation Age of Onset  . Arrhythmia Mother        V TACH  . Ovarian cancer Mother   . Breast cancer Mother 48  . Heart attack Father 86  . Heart attack Maternal Grandfather   . Cancer Maternal Grandfather   . Heart disease Paternal Grandmother   . Sudden Cardiac Death Brother   . Heart attack Brother   . Breast cancer Maternal Aunt   . Breast cancer Maternal Aunt   . Breast cancer Cousin        maternal first cousin    SOCIAL HX: see hpi   Current Outpatient Medications:  .  albuterol (PROVENTIL HFA;VENTOLIN HFA) 108 (90 Base) MCG/ACT inhaler, Inhale 2 puffs into the lungs every 6 (six) hours as needed., Disp: 1 Inhaler, Rfl: 0 .  ALPHA LIPOIC ACID PO, Take 400 mg by mouth every morning., Disp: , Rfl:  .  Ascorbic Acid (VITAMIN C) 1000 MG tablet, Take 1,000 mg by mouth daily., Disp: , Rfl:  .  benzonatate (TESSALON PERLES) 100 MG capsule, Take 1 capsule (100 mg total) by mouth 3 (three) times daily as needed., Disp: 20 capsule, Rfl: 0 .  cetirizine (ZYRTEC) 10 MG tablet, Take 10 mg by mouth at bedtime., Disp: , Rfl:  .  Cholecalciferol (VITAMIN D3 PO), Take 2,000 Units by mouth daily., Disp: , Rfl:  .  loratadine (CLARITIN) 10 MG tablet, Take 10 mg by mouth every morning., Disp: , Rfl:  .  MAGNESIUM CITRATE PO, Take 100 mg by mouth daily., Disp: , Rfl:  .  Multiple Vitamin (MULTIVITAMIN) tablet, Take 1 tablet by mouth daily., Disp: , Rfl:  .  Multiple Vitamins-Minerals (ZINC PO), Take 50 mg by mouth daily., Disp: , Rfl:  .  OVER THE COUNTER MEDICATION, Areds, Disp: , Rfl:  .  Potassium 99 MG TABS, Take by mouth daily., Disp: , Rfl:  .  rosuvastatin (CRESTOR) 5 MG tablet, Take 1 tablet (5 mg total) by mouth daily., Disp: 90 tablet, Rfl: 3 .  SELENIUM PO, Take 200 mg by mouth daily., Disp: , Rfl:  .  VITAMIN A PO, Take by mouth., Disp: , Rfl:   EXAM:  VITALS per patient if applicable:  GENERAL: alert, oriented, no audible acute  distress  LUNGS: no audible respiratory distress, no audible obvious gross SOB, gasping or wheezing  PSYCH/NEURO: pleasant and cooperative,sounds cheerful, speech and thought processing grossly intact  ASSESSMENT AND PLAN:  Discussed the following assessment and plan:  Rhinosinusitis  Cough  Bronchitis  So glad she is doing better Advised to continue flonase for 2 weeks Advised prn albuterol and cough medication Advised prompt reevaluation if any worsening, new concerns or persistent issues   I discussed the assessment and treatment plan with the patient. The patient was provided an opportunity to ask questions and all were answered. The patient agreed with the plan and demonstrated an understanding of the instructions.     Terressa Koyanagi, DO

## 2019-05-31 ENCOUNTER — Encounter: Payer: Self-pay | Admitting: Family Medicine

## 2019-05-31 ENCOUNTER — Ambulatory Visit (INDEPENDENT_AMBULATORY_CARE_PROVIDER_SITE_OTHER): Payer: No Typology Code available for payment source | Admitting: Family Medicine

## 2019-05-31 ENCOUNTER — Other Ambulatory Visit: Payer: Self-pay

## 2019-05-31 VITALS — Wt 245.0 lb

## 2019-05-31 DIAGNOSIS — E118 Type 2 diabetes mellitus with unspecified complications: Secondary | ICD-10-CM | POA: Diagnosis not present

## 2019-05-31 DIAGNOSIS — R229 Localized swelling, mass and lump, unspecified: Secondary | ICD-10-CM | POA: Diagnosis not present

## 2019-05-31 NOTE — Progress Notes (Signed)
Virtual Visit via Video Note  I connected with Lindsay Sanchez  on 05/31/19 at  3:00 PM EDT by a video enabled telemedicine application and verified that I am speaking with the correct person using two identifiers.  Location patient: home Location provider:work or home office Persons participating in the virtual visit: patient, provider   HPI:  Follow up and acute issues:  Diabetes: -meds: did not tolerate a number of medications including metformin, glimepiride and other -working on lifestyle changes and doing much better -BS are around 110-140 -still doing a lot of exercise and eating healthy, she is eating a little more fruit then she was  Morbid obesity: -working on low sugar healthy diet and regular exercise and has lost over 70 lbs and if feeling great -wt is stable at 245 lbs  New issues of a knot on her R upper urm: -has been there for at least 2-3 months -now bothers her with some discomfort -she reports it is mobile and rubbery subcut mass about 1.5 cm in diameter -she has had a few cysts that she had removed in the past -desires removal of this as well  ROS: See pertinent positives and negatives per HPI.  Past Medical History:  Diagnosis Date  . Anxiety    anxiety attacks are infrequent  . Asthma   . Chest tightness   . Diabetes (HCC)   . Macular degeneration    per patient-treated by Dr Dione BoozeGroat  . Meralgia paresthetica of left side 12/12/2015  . Obesity 09/27/2012  . Urinary frequency     Past Surgical History:  Procedure Laterality Date  . ABDOMINAL HYSTERECTOMY    . CESAREAN SECTION    . FOOT SURGERY    . HAMMERTOE RECONSTRUCTION WITH WEIL OSTEOTOMY Right 12/25/2015   Procedure: RIGHT THIRD HAMMERTOE CORRECTION, WEIL OSTEOTOMY;  Surgeon: Toni ArthursJohn Hewitt, MD;  Location: [facility];  Service: Orthopedics;  Laterality: Right;  . HAND SURGERY    . HARDWARE REMOVAL Right 12/25/2015   Procedure: RIGHT FIFTH METATARSAL DEEP IMPLANT REMOVAL, EXOSTECTOMY;   Surgeon: Toni ArthursJohn Hewitt, MD;  Location: [facility];  Service: Orthopedics;  Laterality: Right;  . NECK SURGERY    . TONSILLECTOMY      Family History  Problem Relation Age of Onset  . Arrhythmia Mother        V TACH  . Ovarian cancer Mother   . Breast cancer Mother [id number]  . Heart attack Father 7149  . Heart attack Maternal Grandfather   . Cancer Maternal Grandfather   . Heart disease Paternal Grandmother   . Sudden Cardiac Death Brother   . Heart attack Brother   . Breast cancer Maternal Aunt   . Breast cancer Maternal Aunt   . Breast cancer Cousin        maternal first cousin    SOCIAL HX: see hpi   Current Outpatient Medications:  .  albuterol (PROVENTIL HFA;VENTOLIN HFA) 108 (90 Base) MCG/ACT inhaler, Inhale 2 puffs into the lungs every 6 (six) hours as needed., Disp: 1 Inhaler, Rfl: 0 .  ALPHA LIPOIC ACID PO, Take 400 mg by mouth every morning., Disp: , Rfl:  .  Ascorbic Acid (VITAMIN C) 1000 MG tablet, Take 1,000 mg by mouth daily., Disp: , Rfl:  .  cetirizine (ZYRTEC) 10 MG tablet, Take 10 mg by mouth at bedtime., Disp: , Rfl:  .  Cholecalciferol (VITAMIN D3 PO), Take 2,000 Units by mouth daily., Disp: , Rfl:  .  loratadine (CLARITIN) 10 MG tablet,  Take 10 mg by mouth every morning., Disp: , Rfl:  .  MAGNESIUM CITRATE PO, Take 100 mg by mouth daily., Disp: , Rfl:  .  Multiple Vitamin (MULTIVITAMIN) tablet, Take 1 tablet by mouth daily., Disp: , Rfl:  .  Multiple Vitamins-Minerals (ZINC PO), Take 50 mg by mouth daily., Disp: , Rfl:  .  OVER THE COUNTER MEDICATION, Areds, Disp: , Rfl:  .  Potassium 99 MG TABS, Take by mouth daily., Disp: , Rfl:  .  rosuvastatin (CRESTOR) 5 MG tablet, Take 1 tablet (5 mg total) by mouth daily., Disp: 90 tablet, Rfl: 3 .  SELENIUM PO, Take 200 mg by mouth daily., Disp: , Rfl:  .  VITAMIN A PO, Take by mouth., Disp: , Rfl:   EXAM:  VITALS per patient if applicable: Today's Vitals   05/31/19 1518  Weight: 245 lb (111.1 kg)    Body mass index is 40.77 kg/m.   GENERAL: alert, oriented, appears well and in no acute distress  HEENT: atraumatic, conjunttiva clear, no obvious abnormalities on inspection of external nose and ears  NECK: normal movements of the head and neck  LUNGS: on inspection no signs of respiratory distress, breathing rate appears normal, no obvious gross SOB, gasping or wheezing  CV: no obvious cyanosis  SKIN: she palpates an approx 1.5cm area on the R upper outer arm and is able to move this area, no overlying punctate, redness or skin changes - does have tattoo and mole in the area  MS: moves all visible extremities without noticeable abnormality  PSYCH/NEURO: pleasant and cooperative, no obvious depression or anxiety, speech and thought processing grossly intact  ASSESSMENT AND PLAN:  Discussed the following assessment and plan:  Controlled type 2 diabetes mellitus with complication, without long-term current use of insulin (HCC) -continue healthy low sugar diet and regular exercise -labs at next visit per her preference  Morbid obesity ([place]) -lifestyle recs, continue healthy diet and exercise  Subcutaneous mass - Plan: Ambulatory referral to General Surgery, discussed potential etiologies and she desires removal. Referral placed. Advised to call if does not have details of the appointment in 1 week.  Due for colonoscopy, referral sent, she needs number to call as was delayed due to [place]. Assistant to provide her number and she agrees to call to schedule.  Follow up 3-4 months with labs ordered then.   I discussed the assessment and treatment plan with the patient. The patient was provided an opportunity to ask questions and all were answered. The patient agreed with the plan and demonstrated an understanding of the instructions.   The patient was advised to call back or seek an in-person evaluation if the symptoms worsen or if the condition fails to improve as  anticipated.   Follow up instructions: Advised assistant Wendie Simmer to help patient arrange the following: -follow up with Dr. Maudie Mercury 3-4 months -please give her the [place] # to call to schedule her colonosocpy Lucretia Kern, DO    Patient Instructions  -We placed a referral for you as discussed about the lump on the arm. It usually takes about 1-2 weeks to process and schedule this referral. If you have not heard from [place] regarding this appointment in 2 weeks please contact our office.  Call to schedule the colon cancer screening.  Keep up the healthy diet and regular exercise.  Follow up in 3-4 months, sooner as needed if any concerns.

## 2019-05-31 NOTE — Patient Instructions (Addendum)
-  We placed a referral for you as discussed about the lump on the arm. It usually takes about 1-2 weeks to process and schedule this referral. If you have not heard from [place] regarding this appointment in 2 weeks please contact our office.  Call to schedule the colon cancer screening.  Keep up the healthy diet and regular exercise.  Follow up in 3-4 months, sooner as needed if any concerns.

## 2019-06-01 ENCOUNTER — Telehealth: Payer: Self-pay | Admitting: *Deleted

## 2019-06-01 ENCOUNTER — Encounter: Payer: Self-pay | Admitting: *Deleted

## 2019-06-01 NOTE — Telephone Encounter (Signed)
-----   Message from Lucretia Kern, DO sent at 05/31/2019  4:44 PM EDT ----- -follow up with Dr. Maudie Mercury 3-4 months-please give her the [place] # to call to schedule her colonosocpy

## 2019-06-01 NOTE — Telephone Encounter (Signed)
I called the pt and scheduled an appt for 11/10.  Per patient's request, Mychart message sent to the pt with information to call [facility].

## 2019-06-07 ENCOUNTER — Telehealth: Payer: Self-pay | Admitting: Cardiology

## 2019-06-07 NOTE — Telephone Encounter (Signed)
Lm for recall °

## 2019-07-09 NOTE — Progress Notes (Signed)
Pt had drawn 10/26/18

## 2019-07-09 NOTE — Progress Notes (Signed)
Pt completed 10/26/18

## 2019-10-02 ENCOUNTER — Other Ambulatory Visit: Payer: Self-pay

## 2019-10-02 ENCOUNTER — Ambulatory Visit (INDEPENDENT_AMBULATORY_CARE_PROVIDER_SITE_OTHER): Payer: No Typology Code available for payment source | Admitting: Family Medicine

## 2019-10-02 ENCOUNTER — Encounter: Payer: Self-pay | Admitting: Family Medicine

## 2019-10-02 ENCOUNTER — Telehealth: Payer: Self-pay | Admitting: *Deleted

## 2019-10-02 VITALS — Temp 96.2°F | Wt 245.0 lb

## 2019-10-02 DIAGNOSIS — R229 Localized swelling, mass and lump, unspecified: Secondary | ICD-10-CM | POA: Diagnosis not present

## 2019-10-02 DIAGNOSIS — Z1211 Encounter for screening for malignant neoplasm of colon: Secondary | ICD-10-CM

## 2019-10-02 DIAGNOSIS — E118 Type 2 diabetes mellitus with unspecified complications: Secondary | ICD-10-CM

## 2019-10-02 NOTE — Progress Notes (Signed)
Virtual Visit via Video Note  I connected with Lindsay Sanchez  on 10/02/19 at  4:00 PM EST by a video enabled telemedicine application and verified that I am speaking with the correct person using two identifiers.  Location patient: home Location provider:work or home office Persons participating in the virtual visit: patient, provider  I discussed the limitations of evaluation and management by telemedicine and the availability of in person appointments. The patient expressed understanding and agreed to proceed.   HPI:   Follow up: -PMH Diabetes, morbid obesity, asthma, anxiety, subcut mass (referred to surgery last visit) -HM Due: HIV screen, mammo, colonoscopy, flu vaccine, foot exam and hgba1c -she needs a PCP as I no longer do PC for [place] -BS has been in the 130s on average fasting check -wt is stable at 240-245, still trying to eat healthy and exercise (walks 2-3 miles several days per week) -she reports she is set up for her colonoscopy with Dr. Earlean Shawl, Dec 3rd -she reports she gets her mammogram every year in February at the breast center and saw her gynecologist not long ago -wants to do flu shot at our office -prefers PCP in our office -she still has the lump on the arm but with everything going on "that it got pushed to the back burner" she does wish to schedule, we referred to [facility] surgery per referral notes  ROS: See pertinent positives and    negatives per HPI.  Past Medical History:  Diagnosis Date  . Anxiety    anxiety attacks are infrequent  . Asthma   . Chest tightness   . Diabetes ([place])   . Macular degeneration    per patient-treated by Dr Katy Fitch  . Meralgia paresthetica of left side 12/12/2015  . Obesity 09/27/2012  . Urinary frequency     Past Surgical History:  Procedure Laterality Date  . ABDOMINAL HYSTERECTOMY    . CESAREAN SECTION    . FOOT SURGERY    . HAMMERTOE RECONSTRUCTION WITH WEIL OSTEOTOMY Right 12/25/2015   Procedure: RIGHT THIRD HAMMERTOE  CORRECTION, WEIL OSTEOTOMY;  Surgeon: Wylene Simmer, MD;  Location: [place];  Service: Orthopedics;  Laterality: Right;  . HAND SURGERY    . HARDWARE REMOVAL Right 12/25/2015   Procedure: RIGHT FIFTH METATARSAL DEEP IMPLANT REMOVAL, EXOSTECTOMY;  Surgeon: Wylene Simmer, MD;  Location: [place];  Service: Orthopedics;  Laterality: Right;  . NECK SURGERY    . TONSILLECTOMY      Family History  Problem Relation Age of Onset  . Arrhythmia Mother        V TACH  . Ovarian cancer Mother   . Breast cancer Mother 51  . Heart attack Father 68  . Heart attack Maternal Grandfather   . Cancer Maternal Grandfather   . Heart disease Paternal Grandmother   . Sudden Cardiac Death Brother   . Heart attack Brother   . Breast cancer Maternal Aunt   . Breast cancer Maternal Aunt   . Breast cancer Cousin        maternal first cousin    SOCIAL HX: see hpi   Current Outpatient Medications:  .  albuterol (PROVENTIL HFA;VENTOLIN HFA) 108 (90 Base) MCG/ACT inhaler, Inhale 2 puffs into the lungs every 6 (six) hours as needed., Disp: 1 Inhaler, Rfl: 0 .  ALPHA LIPOIC ACID PO, Take 400 mg by mouth every morning., Disp: , Rfl:  .  Ascorbic Acid (VITAMIN C) 1000 MG tablet, Take 1,000 mg by mouth daily., Disp: ,  Rfl:  .  cetirizine (ZYRTEC) 10 MG tablet, Take 10 mg by mouth at bedtime., Disp: , Rfl:  .  Cholecalciferol (VITAMIN D3 PO), Take 2,000 Units by mouth daily., Disp: , Rfl:  .  loratadine (CLARITIN) 10 MG tablet, Take 10 mg by mouth every morning., Disp: , Rfl:  .  MAGNESIUM CITRATE PO, Take 100 mg by mouth daily., Disp: , Rfl:  .  Multiple Vitamin (MULTIVITAMIN) tablet, Take 1 tablet by mouth daily., Disp: , Rfl:  .  Multiple Vitamins-Minerals (ZINC PO), Take 50 mg by mouth daily., Disp: , Rfl:  .  OVER THE COUNTER MEDICATION, Areds, Disp: , Rfl:  .  Potassium 99 MG TABS, Take by mouth daily., Disp: , Rfl:  .  rosuvastatin (CRESTOR) 5 MG tablet, Take 1 tablet (5 mg  total) by mouth daily., Disp: 90 tablet, Rfl: 3 .  SELENIUM PO, Take 200 mg by mouth daily., Disp: , Rfl:  .  VITAMIN A PO, Take by mouth., Disp: , Rfl:   EXAM:  VITALS per patient if applicable:  GENERAL: alert, oriented, appears well and in no acute distress  HEENT: atraumatic, conjunttiva clear, no obvious abnormalities on inspection of external nose and ears  NECK: normal movements of the head and neck  LUNGS: on inspection no signs of respiratory distress, breathing rate appears normal, no obvious gross SOB, gasping or wheezing  CV: no obvious cyanosis  MS: moves all visible extremities without noticeable abnormality  PSYCH/NEURO: pleasant and cooperative, no obvious depression or anxiety, speech and thought processing grossly intact  ASSESSMENT AND PLAN:  Discussed the following assessment and plan:  Controlled type 2 diabetes mellitus with complication, without long-term current use of insulin (HCC)  Morbid obesity (HCC)  Subcutaneous mass  Colon cancer screening  -advised again of all health maintenance due (colon ca screening, mammogram, labs, vaccines, etc) and offered to assist. She reports colonoscopy is scheduled with Dr. Kinnie Scales, mammogram is done yearly - advised assistant to obtain report, coming in for labs and flu shot -advised of lab due and ordered, she agrees to set up a lab visit -needs PCP, reminded pt and sent message to schedulers to assist -lifestyle recs -follow up in 3-4 months -she agrees to call office for labs/flu shot and PCP visit -she agrees to call breast center for mammogram -she agrees to call surgery office to schedule visit - also advised assistant to check to see if they need anything further from [place]  I discussed the assessment and treatment plan with the patient. The patient was provided an opportunity to ask questions and all were answered. The patient agreed with the plan and demonstrated an understanding of the instructions.    The patient was advised to call back or seek in person care if needed in the interim.   Terressa Koyanagi, DO

## 2019-10-02 NOTE — Telephone Encounter (Signed)
Per Dr Maudie Mercury, she requested a copy of the recent mammogram and info on the general surgeon referral.  I called the [facility] and was told the pt has not had a mammogram since 2016.  Also called [facility] and was told the pt has not had a mammogram there at all.  Neoma Laming stated she will work on updating the referral and have someone call the pt with appt info.  Patient was informed of this and stated she had the mammogram at the [facility] and will call them.  Patient is aware to expect a call with appt info for the general surgeon.

## 2019-10-05 ENCOUNTER — Ambulatory Visit: Payer: No Typology Code available for payment source

## 2019-10-11 ENCOUNTER — Other Ambulatory Visit: Payer: Self-pay

## 2019-10-12 ENCOUNTER — Ambulatory Visit: Payer: No Typology Code available for payment source

## 2019-10-12 ENCOUNTER — Other Ambulatory Visit: Payer: No Typology Code available for payment source

## 2019-10-25 LAB — HM COLONOSCOPY

## 2019-11-06 ENCOUNTER — Encounter: Payer: Self-pay | Admitting: Family Medicine

## 2019-11-08 NOTE — Progress Notes (Signed)
Virtual Visit via Telephone Note   This visit type was conducted due to national recommendations for restrictions regarding the COVID-19 Pandemic (e.g. social distancing) in an effort to limit this patient's exposure and mitigate transmission in our community.  Due to her co-morbid illnesses, this patient is at least at moderate risk for complications without adequate follow up.  This format is felt to be most appropriate for this patient at this time.  The patient did not have access to video technology/had technical difficulties with video requiring transitioning to audio format only (telephone).  All issues noted in this document were discussed and addressed.  No physical exam could be performed with this format.  Please refer to the patient's chart for her  consent to telehealth for [facility]. Virtual platform was offered given ongoing worsening Covid-19 pandemic.  Date:  11/09/2019   ID:  Lindsay Sanchez, DOB 05/02/1971, MRN [id number]  Patient Location: Home Provider Location: [facility] Office  PCP:  No primary care provider on file.  Cardiologist:  Peter SwazilandJordan, MD  Electrophysiologist:  None   Evaluation Performed:  Follow-Up Visit  Chief Complaint:  follow-up for family history of early CAD and prior chest pain  History of Present Illness:    Lindsay Sanchez is a 48 y.o. female with a history of type 2 diabetes mellitus, morbid obesity, and family history of early CAD who is followed by Dr. SwazilandJordan and presents today for follow-up.   Patient has no known cardiac history herself but does have a significant family history of CAD. Multiple family members have died from heart attacks including her father at age 11049, two cousins at age 50 and 5351, and more recently 2 of her brothers.  Patient was first seen in 02/2016 for pre-operative cardiac assessment for planned gastric bypass. She denied any cardiac symptoms at that time and had a normal stress Echo in 2013. She  was cleared for surgery without any additional cardiac testing. She was instructed to follow-up as needed. She was last seen by Azalee CourseHao Meng, PA-C, in 12/2016 at which time she reported 3 episodes of chest pain that she described as a pressure that lasted for about 4 minutes before resolving without medications. She also reported burning sensation in her legs especially with ambulation. Given significant family history of CAD, Lexiscan Myoview was ordered and showed normal wall motion and systolic function with no evidence of ischemia. Lower extremities dopplers/ABIs were also ordered and were normal. Patient was advised to follow-up in 6 months but was lost to follow-up.  Patient presents today for virtual visit. Spoke with patient on the phone. Patient has been doing great from a cardiac standpoint. Since we last saw her, patient has lost 100 lbs by switching to Keto diet and increasing activity. As a result of this weight loss, she has been able to come off all of her diabetes medications. She denies any recurrent chest pain since last visit. She states she thinks this was just due to anxiety because it was right after her brother died from an MI. She was started on Lexapro for a while with improvement of her anxiety. She has since been able to come off of this and has had no recurrent chest pain. Patient states she only has shortness of breath if her allergies are really bad. OK right now. No orthopnea, PND, palpitations, lightheadedness, dizziness, or near syncope/syncope. She occasionally has mild bilateral ankle edema if she has been on her feet all day but states compression  stockings help.   The patient does not have symptoms concerning for COVID-19 infection (fever, chills, cough, or new shortness of breath).    Past Medical History:  Diagnosis Date  . Anxiety    anxiety attacks are infrequent  . Asthma   . Chest tightness   . Diabetes (HCC)   . Macular degeneration    per patient-treated by Dr  Dione Booze  . Meralgia paresthetica of left side 12/12/2015  . Obesity 09/27/2012  . Urinary frequency    Past Surgical History:  Procedure Laterality Date  . ABDOMINAL HYSTERECTOMY    . CESAREAN SECTION    . FOOT SURGERY    . HAMMERTOE RECONSTRUCTION WITH WEIL OSTEOTOMY Right 12/25/2015   Procedure: RIGHT THIRD HAMMERTOE CORRECTION, WEIL OSTEOTOMY;  Surgeon: Toni Arthurs, MD;  Location: [facility];  Service: Orthopedics;  Laterality: Right;  . HAND SURGERY    . HARDWARE REMOVAL Right 12/25/2015   Procedure: RIGHT FIFTH METATARSAL DEEP IMPLANT REMOVAL, EXOSTECTOMY;  Surgeon: Toni Arthurs, MD;  Location: [facility];  Service: Orthopedics;  Laterality: Right;  . NECK SURGERY    . TONSILLECTOMY       Current Meds  Medication Sig  . albuterol (PROVENTIL HFA;VENTOLIN HFA) 108 (90 Base) MCG/ACT inhaler Inhale 2 puffs into the lungs every 6 (six) hours as needed.  . cetirizine (ZYRTEC) 10 MG tablet Take 10 mg by mouth at bedtime.  . Multiple Vitamin (MULTIVITAMIN) tablet Take 1 tablet by mouth daily.     Allergies:   Bee venom, Other, and Adhesive [tape]   Social History   Tobacco Use  . Smoking status: Never Smoker  . Smokeless tobacco: Never Used  Substance Use Topics  . Alcohol use: Yes  . Drug use: No     Family Hx: The patient's family history includes Arrhythmia in her mother; Breast cancer in her cousin, maternal aunt, and maternal aunt; Breast cancer (age of onset: 105) in her mother; Cancer in her maternal grandfather; Heart attack in her brother and maternal grandfather; Heart attack (age of onset: 51) in her father; Heart disease in her paternal grandmother; Ovarian cancer in her mother; Sudden Cardiac Death in her brother.  ROS:   Please see the history of present illness.    All other systems reviewed and are negative.   Prior CV studies:    The following studies were reviewed today:   Myoview 01/19/2017:  The left ventricular ejection fraction  is normal (55-65%).  Nuclear stress EF: 60%.  Blood pressure demonstrated a normal response to exercise.  There was no ST segment deviation noted during stress.  No T wave inversion was noted during stress.  This is a low risk study.   No reversible ischemia. LVEF 60% with normal wall motion. This is a low risk study. _______________  Lower Extremity Dopplers/ABIs 02/19/2019: Impressions: No evidence of segmental lower extremity arterial disease at rest, bilaterally. Normal ABI's, bilaterally. Normal great toe-brachial indices, bilaterally.  No evidence of abdominal aorto-iliac disease. Lower extremities not evaluated due to normal Doppler waveforms.  Labs/Other Tests and Data Reviewed:    EKG: Most recent EKG from 01/07/2017 showed normal sinus rhythm, rate 79 bpm, with T wave flattening in lead aVL but no significant changes compared to tracing in 2017.  Recent Labs: No results found for requested labs within last 8760 hours.   Recent Lipid Panel Lab Results  Component Value Date/Time   CHOL 211 (H) 10/26/2018 03:08 PM   TRIG 73.0 10/26/2018 03:08 PM  HDL 75.60 10/26/2018 03:08 PM   CHOLHDL 3 10/26/2018 03:08 PM   LDLCALC 120 (H) 10/26/2018 03:08 PM    Wt Readings from Last 3 Encounters:  11/09/19 245 lb (111.1 kg)  10/02/19 245 lb (111.1 kg)  05/31/19 245 lb (111.1 kg)     Objective:    Vital Signs:  Ht 5\' 5"  (1.651 m)   Wt 245 lb (111.1 kg)   BMI 40.77 kg/m    VS Reviewed. Patient not able to give [place] BP reading today. General: No acute distress. Pulm: No labored breathing. No coughing during visit. No audible wheezing. Speaking in full sentences. Neuro: Alert and oriented. No slurred speech. Answers questions appropriately. Psych: Pleasant affect.  ASSESSMENT & PLAN:    Family History of Early CAD Patient has had multiple family members die from a MI in their 73's to 22's. At last visit in 12/2016, she reported a couple of episodes of chest pain. Lexiscan  Myoview showed no evidence of ischemia. Patient thinks chest pain was just due to anxiety as this was right after her brother died from a MI. No recurrent chest pain. Patient has since lost 100 lbs through diet and exercise and has been able to come off all diabetes medications. Congratulated patient on this and encouraged her to keep up the good work.   Type 2 Diabetes Mellitus No longer on any oral medications since significant weight loss. Last hemoglobin A1c 6.9 in 01/2019. Followed by PCP.  Hyperlipidemia Last lipid panel in 10/2018: Total Cholesterol 211, Triglycerides 73, HDL 75, LDL 120. Does not look like patient was started on any statins. Looks like she is planning on having repeat lipid panel done at PCP's office soon. If LDL still elevated, would recommended starting statin given diabetes and significant family of early CAD.  Morbid Obesity BMI 40.7 but patient has lost 100 lbs since last visit. Encouraged patient to continue to stay active and follow heart healthy diet.   COVID-19 Education: The signs and symptoms of COVID-19 were discussed with the patient and how to seek care for testing (follow up with PCP or arrange E-visit).  The importance of social distancing was discussed today.  Time:   Today, I have spent 10 minutes with the patient with telehealth technology discussing the above problems.     Medication Adjustments/Labs and Tests Ordered: Current medicines are reviewed at length with the patient today.  Concerns regarding medicines are outlined above.   Follow Up: Given patient is doing very well and in light of COVID-19 pandemic, will plan for in-person follow-up in 1 year with Dr. [place] or APP. However, patient knows to call us if she develops any new symptoms and we can see her sooner.   Signed, Darreld Mclean, PA-C  11/09/2019 9:10 AM    [facility]

## 2019-11-09 ENCOUNTER — Encounter: Payer: Self-pay | Admitting: Physician Assistant

## 2019-11-09 ENCOUNTER — Telehealth (INDEPENDENT_AMBULATORY_CARE_PROVIDER_SITE_OTHER): Payer: No Typology Code available for payment source | Admitting: Student

## 2019-11-09 VITALS — Ht 65.0 in | Wt 245.0 lb

## 2019-11-09 DIAGNOSIS — E119 Type 2 diabetes mellitus without complications: Secondary | ICD-10-CM | POA: Diagnosis not present

## 2019-11-09 DIAGNOSIS — Z9884 Bariatric surgery status: Secondary | ICD-10-CM

## 2019-11-09 DIAGNOSIS — E785 Hyperlipidemia, unspecified: Secondary | ICD-10-CM | POA: Diagnosis not present

## 2019-11-09 DIAGNOSIS — Z7189 Other specified counseling: Secondary | ICD-10-CM

## 2019-11-09 DIAGNOSIS — Z8249 Family history of ischemic heart disease and other diseases of the circulatory system: Secondary | ICD-10-CM

## 2019-11-09 DIAGNOSIS — E1169 Type 2 diabetes mellitus with other specified complication: Secondary | ICD-10-CM

## 2019-11-09 DIAGNOSIS — E669 Obesity, unspecified: Secondary | ICD-10-CM

## 2019-11-09 DIAGNOSIS — Z6841 Body Mass Index (BMI) 40.0 and over, adult: Secondary | ICD-10-CM

## 2019-11-09 NOTE — Patient Instructions (Signed)
Medication Instructions:  Your physician recommends that you continue on your current medications as directed. Please refer to the Current Medication list given to you today.  *If you need a refill on your cardiac medications before your next appointment, please call your pharmacy*  Lab Work: None ordered   If you have labs (blood work) drawn today and your tests are completely normal, you will receive your results only by: Marland Kitchen MyChart Message (if you have MyChart) OR . A paper copy in the mail If you have any lab test that is abnormal or we need to change your treatment, we will call you to review the results.  Testing/Procedures: None ordered   Follow-Up: At [facility], you and your health needs are our priority.  As part of our continuing mission to provide you with exceptional heart care, we have created designated Provider Care Teams.  These Care Teams include your primary Cardiologist (physician) and Advanced Practice Providers (APPs -  Physician Assistants and Nurse Practitioners) who all work together to provide you with the care you need, when you need it.  Your next appointment:   12 month(s)  The format for your next appointment:   In Person  Provider:   You may see Peter Martinique, MD or one of the following Advanced Practice Providers on your designated Care Team:    Almyra Deforest, PA-C  Fabian Sharp, PA-C or   Roby Lofts, [place]   Other Instructions None ordered

## 2019-12-12 ENCOUNTER — Other Ambulatory Visit: Payer: Self-pay | Admitting: Obstetrics and Gynecology

## 2019-12-12 ENCOUNTER — Telehealth: Payer: Self-pay

## 2019-12-12 DIAGNOSIS — Z1231 Encounter for screening mammogram for malignant neoplasm of breast: Secondary | ICD-10-CM

## 2019-12-12 NOTE — Telephone Encounter (Signed)
Copied from CRM [id number]. Topic: Appointment Scheduling - Transfer of Care >> Dec 12, 2019 12:26 PM Dalphine Handing A wrote: Pt is requesting to transfer FROM: Dr.Kim Pt is requesting to transfer TO: Dr .Salomon Fick Reason for requested transfer: Patient would like to transfer care so that she can be seen in office. Patient also requesting TOC appt for 12/28/2019

## 2019-12-14 ENCOUNTER — Other Ambulatory Visit: Payer: Self-pay

## 2019-12-14 ENCOUNTER — Other Ambulatory Visit (INDEPENDENT_AMBULATORY_CARE_PROVIDER_SITE_OTHER): Payer: BC Managed Care – PPO

## 2019-12-14 DIAGNOSIS — E118 Type 2 diabetes mellitus with unspecified complications: Secondary | ICD-10-CM | POA: Diagnosis not present

## 2019-12-14 LAB — LIPID PANEL
Cholesterol: 203 mg/dL — ABNORMAL HIGH (ref 0–200)
HDL: 82.3 mg/dL (ref >39.00)
LDL Cholesterol: 101 mg/dL — ABNORMAL HIGH (ref 0–99)
NonHDL: 120.96
Total CHOL/HDL Ratio: 2
Triglycerides: 100 mg/dL (ref 0.0–149.0)
VLDL: 20 mg/dL (ref 0.0–40.0)

## 2019-12-14 LAB — BASIC METABOLIC PANEL
BUN: 19 mg/dL (ref 6–23)
CO2: 28 mEq/L (ref 19–32)
Calcium: 9.3 mg/dL (ref 8.4–10.5)
Chloride: 101 mEq/L (ref 96–112)
Creatinine, Ser: 0.73 mg/dL (ref 0.40–1.20)
GFR: 84.06 mL/min (ref >60.00)
Glucose, Bld: 183 mg/dL — ABNORMAL HIGH (ref 70–99)
Potassium: 4.4 mEq/L (ref 3.5–5.1)
Sodium: 138 mEq/L (ref 135–145)

## 2019-12-14 LAB — HEMOGLOBIN A1C: Hgb A1c MFr Bld: 7.6 % — ABNORMAL HIGH (ref 4.6–6.5)

## 2019-12-14 LAB — MICROALBUMIN / CREATININE URINE RATIO
Creatinine,U: 75.6 mg/dL
Microalb Creat Ratio: 0.9 mg/g (ref 0.0–30.0)
Microalb, Ur: <0.7 mg/dL (ref 0.0–1.9)

## 2019-12-14 NOTE — Telephone Encounter (Signed)
Ok for TOC. 

## 2019-12-17 ENCOUNTER — Ambulatory Visit: Payer: No Typology Code available for payment source

## 2019-12-17 NOTE — Telephone Encounter (Signed)
Unfortunately, this provider is not taking new pts or TOC at this time.

## 2019-12-18 NOTE — Telephone Encounter (Signed)
Pt is scheduled for TOC with Dr Wyline Copas on 01/18/2020

## 2019-12-27 DIAGNOSIS — K136 Irritative hyperplasia of oral mucosa: Secondary | ICD-10-CM | POA: Diagnosis not present

## 2019-12-28 ENCOUNTER — Ambulatory Visit
Admission: RE | Admit: 2019-12-28 | Discharge: 2019-12-28 | Disposition: A | Discharge status: Home / Self Care | Payer: BC Managed Care – PPO | Source: Ambulatory Visit | Attending: Obstetrics and Gynecology | Admitting: Obstetrics and Gynecology

## 2019-12-28 ENCOUNTER — Other Ambulatory Visit: Payer: Self-pay

## 2019-12-28 DIAGNOSIS — Z1231 Encounter for screening mammogram for malignant neoplasm of breast: Secondary | ICD-10-CM

## 2019-12-28 DIAGNOSIS — H04123 Dry eye syndrome of bilateral lacrimal glands: Secondary | ICD-10-CM | POA: Diagnosis not present

## 2019-12-28 DIAGNOSIS — H353131 Nonexudative age-related macular degeneration, bilateral, early dry stage: Secondary | ICD-10-CM | POA: Diagnosis not present

## 2019-12-28 DIAGNOSIS — E119 Type 2 diabetes mellitus without complications: Secondary | ICD-10-CM | POA: Diagnosis not present

## 2020-01-18 ENCOUNTER — Ambulatory Visit (INDEPENDENT_AMBULATORY_CARE_PROVIDER_SITE_OTHER): Payer: BC Managed Care – PPO | Admitting: Family Medicine

## 2020-01-18 ENCOUNTER — Encounter: Payer: Self-pay | Admitting: Family Medicine

## 2020-01-18 ENCOUNTER — Other Ambulatory Visit: Payer: Self-pay

## 2020-01-18 VITALS — BP 136/72 | HR 87 | Temp 97.3°F | Ht 65.0 in | Wt 290.8 lb

## 2020-01-18 DIAGNOSIS — E1169 Type 2 diabetes mellitus with other specified complication: Secondary | ICD-10-CM | POA: Diagnosis not present

## 2020-01-18 DIAGNOSIS — E785 Hyperlipidemia, unspecified: Secondary | ICD-10-CM | POA: Diagnosis not present

## 2020-01-18 DIAGNOSIS — E118 Type 2 diabetes mellitus with unspecified complications: Secondary | ICD-10-CM

## 2020-01-18 DIAGNOSIS — Z6841 Body Mass Index (BMI) 40.0 and over, adult: Secondary | ICD-10-CM

## 2020-01-18 NOTE — Progress Notes (Signed)
Lindsay Sanchez DOB: 06-27-1971 Encounter date: 01/18/2020  This is a 49 y.o. female who presents to establish care. Chief Complaint  Patient presents with  . Establish Care    History of present illness:  Tends to get super congested in spring. Does zyrtec and claritin one in morning and one at night. Keeps saline at house. Uses that if congested. Usually humidity more problem than congestion, but sometimes congestion worsens breathing. Uses albuterol inhaler 1-2 x/week when things are bad.   Asthma:see above. Has been well controlled. Hasn't used inhaler for 3 months. Usually fall and spring that are worst.   DMII: Has been doing the keto diet and controlling diabetes with this. Full keto strict diet. Organic foods, grass fed beef. Very healthy eater overall. Sugars have been better on this diet rather than medications. Feels better off meds. On them left leg was numb. Is doing 5K walks. Over holidays and being inside has had issues with portion control for which she blames weight gain. Was down to 245.   Mammogram 12/2019 negative.  Dr. Dione Booze for eyes. Does have macular degneration. Got new glasses this year.  Dr. Timoteo Ace for dentist.  Colonoscopy 10/2019; Dr. Kinnie Scales 5 years for repeat due to family history (although report says 10 years) Follows with Dr. [place] (cardiology) q 3 years. Has appointment coming up.   -she still has the lump on the arm but with everything going on "that it got pushed to the back burner" she does wish to schedule, we referred to [facility] surgery per referral notes: was told just fatty deposit, but not ready to have this removed.    Past Medical History:  Diagnosis Date  . Anxiety    anxiety attacks are infrequent  . Asthma   . Chest tightness   . Diabetes (HCC)   . Macular degeneration    per patient-treated by Dr Dione Booze  . Meralgia paresthetica of left side 12/12/2015  . Obesity 09/27/2012  . Urinary frequency    Past Surgical History:  Procedure  Laterality Date  . ABDOMINAL HYSTERECTOMY     precancer cells on pap smear; thinks left ovar remains  . CESAREAN SECTION    . FOOT SURGERY     cyst removal  . HAMMERTOE RECONSTRUCTION WITH WEIL OSTEOTOMY Right 12/25/2015   Procedure: RIGHT THIRD HAMMERTOE CORRECTION, WEIL OSTEOTOMY;  Surgeon: Toni Arthurs, MD;  Location: [facility];  Service: Orthopedics;  Laterality: Right;  . HAND SURGERY    . HARDWARE REMOVAL Right 12/25/2015   Procedure: RIGHT FIFTH METATARSAL DEEP IMPLANT REMOVAL, EXOSTECTOMY;  Surgeon: Toni Arthurs, MD;  Location: [facility];  Service: Orthopedics;  Laterality: Right;  . MOUTH SURGERY     [facility]-Dr [facility]--removal of cyst per patient-awaiting path results  . NECK SURGERY     cervical fusion  . ROOT CANAL    . TONSILLECTOMY     Allergies  Allergen Reactions  . Bee Venom Anaphylaxis  . Actos [Pioglitazone]     Itching everywhere  . Jardiance [Empagliflozin]     Excessive urination  . Metformin And Related     Diarrhea, intermittent. Leg numbness.  . Other     Ragweed and Pollen  . Adhesive [Tape] Rash    And itching (when left on for extended time)   Current Meds  Medication Sig  . albuterol (PROVENTIL HFA;VENTOLIN HFA) 108 (90 Base) MCG/ACT inhaler Inhale 2 puffs into the lungs every 6 (six) hours as needed.  . cetirizine (ZYRTEC)  10 MG tablet Take 10 mg by mouth at bedtime.  Marland Kitchen loratadine (CLARITIN) 10 MG tablet Take 10 mg by mouth every morning.  . Multiple Vitamin (MULTIVITAMIN) tablet Take 1 tablet by mouth daily.   Social History   Tobacco Use  . Smoking status: Never Smoker  . Smokeless tobacco: Never Used  Substance Use Topics  . Alcohol use: Yes    Comment: occasional   Family History  Problem Relation Age of Onset  . Arrhythmia Mother        V TACH  . Ovarian cancer Mother   . Breast cancer Mother 65  . Heart attack Father 21  . Heart attack Maternal Grandfather   . Cancer Maternal Grandfather   .  Heart disease Paternal Grandmother   . Sudden Cardiac Death Brother   . Heart attack Brother   . Breast cancer Maternal Aunt   . Breast cancer Maternal Aunt   . Breast cancer Cousin        maternal first cousin     Review of Systems  Constitutional: Negative for chills, fatigue and fever.  Respiratory: Negative for cough, chest tightness, shortness of breath and wheezing.   Cardiovascular: Negative for chest pain, palpitations and leg swelling.    Objective:  BP 136/72 (BP Location: Left Arm, Patient Position: Sitting, Cuff Size: Large)   Pulse 87   Temp (!) 97.3 F (36.3 C) (Temporal)   Ht 5\' 5"  (1.651 m)   Wt 290 lb 12.8 oz (131.9 kg)   BMI 48.39 kg/m   Weight: 290 lb 12.8 oz (131.9 kg)   BP Readings from Last 3 Encounters:  01/18/20 136/72  01/25/19 136/80  10/26/18 112/80   Wt Readings from Last 3 Encounters:  01/18/20 290 lb 12.8 oz (131.9 kg)  11/09/19 245 lb (111.1 kg)  10/02/19 245 lb (111.1 kg)    Physical Exam Constitutional:      General: She is not in acute distress.    Appearance: She is well-developed.  HENT:     Head: Normocephalic and atraumatic.  Cardiovascular:     Rate and Rhythm: Normal rate and regular rhythm.     Heart sounds: Normal heart sounds. No murmur.  Pulmonary:     Effort: Pulmonary effort is normal.     Breath sounds: Normal breath sounds.  Abdominal:     General: Bowel sounds are normal. There is no distension.     Palpations: Abdomen is soft.     Tenderness: There is no abdominal tenderness. There is no guarding.  Skin:    General: Skin is warm and dry.     Comments: Sensory exam of the foot is normal, tested with the monofilament. Good pulses, no lesions or ulcers, good peripheral pulses.  Psychiatric:        Judgment: Judgment normal.     Assessment/Plan:  1. Controlled type 2 diabetes mellitus with complication, without long-term current use of insulin ([place]) Prefers to control diabetes without medication.  She is  currently on the ketogenic diet.  Has not been as good with portion control, so has gained weight.  Planning to get back on track with this. - CBC with Differential/Platelet; Future - Hemoglobin A1c; Future  2. Hyperlipidemia associated with type 2 diabetes mellitus ([place]) Diet controlled.  She prefers to avoid statins. - Comprehensive metabolic panel; Future - Lipid panel; Future - TSH; Future  3. Class 3 severe obesity due to excess calories with serious comorbidity and body mass index (BMI) of 45.0 to  49.9 in adult [facility]) She is working on regular exercise as well as portion control to help with weight.  Motivated to do this without taking medications.  Return in about 6 months (around 07/17/2020) for physical exam with bloodwork before.  Theodis Shove, MD

## 2020-03-13 ENCOUNTER — Ambulatory Visit: Payer: BC Managed Care – PPO

## 2020-06-23 NOTE — Addendum Note (Signed)
Addended by: Lerry Liner on: 06/23/2020 03:12 PM   Modules accepted: Orders

## 2020-06-24 ENCOUNTER — Other Ambulatory Visit: Payer: Self-pay

## 2020-06-24 ENCOUNTER — Ambulatory Visit: Payer: BC Managed Care – PPO | Admitting: Family Medicine

## 2020-06-24 ENCOUNTER — Encounter: Payer: Self-pay | Admitting: Family Medicine

## 2020-06-24 VITALS — BP 140/70 | HR 94

## 2020-06-24 DIAGNOSIS — J40 Bronchitis, not specified as acute or chronic: Secondary | ICD-10-CM | POA: Diagnosis not present

## 2020-06-24 MED ORDER — METHYLPREDNISOLONE 4 MG PO TBPK: ORAL_TABLET | ORAL | 0 refills | Status: DC

## 2020-06-24 MED ORDER — AZITHROMYCIN 250 MG PO TABS: ORAL_TABLET | ORAL | 0 refills | Status: DC

## 2020-06-24 NOTE — Progress Notes (Signed)
   Subjective:    Patient ID: Lindsay Sanchez, female    DOB: June 26, 1971, 49 y.o.   MRN: [id number]  HPI Here for one week of stuffy head, PND, ST, and a dry cough. No fever or chest pain or SOB. No body aches or NVD. She is taking Zyrtec, Claritin, and Mucinex. She has an inhaler but she has not had to use it.    Review of Systems  Constitutional: Negative.   HENT: Positive for congestion, postnasal drip and sore throat.   Eyes: Negative.   Respiratory: Positive for cough and chest tightness. Negative for shortness of breath and wheezing.   Cardiovascular: Negative.        Objective:   Physical Exam Constitutional:      General: She is not in acute distress.    Comments: Her voice is hoarse   HENT:     Right Ear: Tympanic membrane, ear canal and external ear normal.     Left Ear: Tympanic membrane, ear canal and external ear normal.     Nose: Nose normal.     Mouth/Throat:     Pharynx: Oropharynx is clear.  Eyes:     Conjunctiva/sclera: Conjunctivae normal.  Cardiovascular:     Rate and Rhythm: Normal rate and regular rhythm.     Pulses: Normal pulses.     Heart sounds: Normal heart sounds.  Pulmonary:     Effort: Pulmonary effort is normal. No respiratory distress.     Breath sounds: Normal breath sounds. No stridor. No wheezing, rhonchi or rales.  Lymphadenopathy:     Cervical: No cervical adenopathy.  Neurological:     Mental Status: She is alert.           Assessment & Plan:  Bronchitis, treat with a Zpack and a Medrol dose pack. Gershon Crane, MD

## 2020-07-04 ENCOUNTER — Telehealth: Payer: Self-pay | Admitting: Family Medicine

## 2020-07-04 NOTE — Telephone Encounter (Signed)
Dr. Clent Ridges addressed this.

## 2020-07-04 NOTE — Telephone Encounter (Signed)
Please call in another Zpack  

## 2020-07-04 NOTE — Telephone Encounter (Signed)
Pt saw Dr. Clent Ridges for her bronchitis and she took the antibiotic prescribed. Her sympotms improved and then two days after taking the last pill the sore throat came back. She said every time she has had bronchitis it usually would take two rounds of antibiotics in order to completely clear it up. Pt is wondering if another round could be called in?   Pt would like a call back at [phone]  Pharmacy:  [facility] [address], [place] [address] Phone:  [phone]  Fax:  [phone]

## 2020-07-07 MED ORDER — AZITHROMYCIN 250 MG PO TABS: ORAL_TABLET | ORAL | 0 refills | Status: DC

## 2020-07-07 NOTE — Addendum Note (Signed)
Addended by: Solon Augusta on: 07/07/2020 08:21 AM   Modules accepted: Orders

## 2020-07-10 ENCOUNTER — Encounter: Payer: Self-pay | Admitting: Genetic Counselor

## 2020-07-11 ENCOUNTER — Other Ambulatory Visit: Payer: Self-pay

## 2020-07-11 ENCOUNTER — Other Ambulatory Visit: Payer: BC Managed Care – PPO

## 2020-07-11 DIAGNOSIS — E118 Type 2 diabetes mellitus with unspecified complications: Secondary | ICD-10-CM

## 2020-07-11 DIAGNOSIS — E1169 Type 2 diabetes mellitus with other specified complication: Secondary | ICD-10-CM | POA: Diagnosis not present

## 2020-07-11 DIAGNOSIS — E785 Hyperlipidemia, unspecified: Secondary | ICD-10-CM | POA: Diagnosis not present

## 2020-07-12 LAB — CBC WITH DIFFERENTIAL/PLATELET
Absolute Monocytes: 570 cells/uL (ref 200–950)
Basophils Absolute: 27 cells/uL (ref 0–200)
Basophils Relative: 0.4 %
Eosinophils Absolute: 87 cells/uL (ref 15–500)
Eosinophils Relative: 1.3 %
HCT: 42.1 % (ref 35.0–45.0)
Hemoglobin: 14 g/dL (ref 11.7–15.5)
Lymphs Abs: 1320 cells/uL (ref 850–3900)
MCH: 29.4 pg (ref 27.0–33.0)
MCHC: 33.3 g/dL (ref 32.0–36.0)
MCV: 88.3 fL (ref 80.0–100.0)
MPV: 10.5 fL (ref 7.5–12.5)
Monocytes Relative: 8.5 %
Neutro Abs: 4697 cells/uL (ref 1500–7800)
Neutrophils Relative %: 70.1 %
Platelets: 201 10*3/uL (ref 140–400)
RBC: 4.77 10*6/uL (ref 3.80–5.10)
RDW: 13.5 % (ref 11.0–15.0)
Total Lymphocyte: 19.7 %
WBC: 6.7 10*3/uL (ref 3.8–10.8)

## 2020-07-12 LAB — COMPREHENSIVE METABOLIC PANEL
AG Ratio: 1.7 (calc) (ref 1.0–2.5)
ALT: 21 U/L (ref 6–29)
AST: 19 U/L (ref 10–35)
Albumin: 4.3 g/dL (ref 3.6–5.1)
Alkaline phosphatase (APISO): 67 U/L (ref 37–153)
BUN: 14 mg/dL (ref 7–25)
CO2: 27 mmol/L (ref 20–32)
Calcium: 9.1 mg/dL (ref 8.6–10.4)
Chloride: 100 mmol/L (ref 98–110)
Creat: 0.59 mg/dL (ref 0.50–1.05)
Globulin: 2.5 g/dL (calc) (ref 1.9–3.7)
Glucose, Bld: 263 mg/dL — ABNORMAL HIGH (ref 65–99)
Potassium: 4.8 mmol/L (ref 3.5–5.3)
Sodium: 136 mmol/L (ref 135–146)
Total Bilirubin: 0.5 mg/dL (ref 0.2–1.2)
Total Protein: 6.8 g/dL (ref 6.1–8.1)

## 2020-07-12 LAB — HEMOGLOBIN A1C
Hgb A1c MFr Bld: 8.8 % of total Hgb — ABNORMAL HIGH (ref <5.7)
Mean Plasma Glucose: 206 (calc)
eAG (mmol/L): 11.4 (calc)

## 2020-07-12 LAB — TSH: TSH: 2.09 mIU/L

## 2020-07-12 LAB — LIPID PANEL
Cholesterol: 230 mg/dL — ABNORMAL HIGH (ref <200)
HDL: 73 mg/dL (ref >=50)
LDL Cholesterol (Calc): 131 mg/dL (calc) — ABNORMAL HIGH
Non-HDL Cholesterol (Calc): 157 mg/dL (calc) — ABNORMAL HIGH (ref <130)
Total CHOL/HDL Ratio: 3.2 (calc) (ref <5.0)
Triglycerides: 148 mg/dL (ref <150)

## 2020-07-25 ENCOUNTER — Telehealth (INDEPENDENT_AMBULATORY_CARE_PROVIDER_SITE_OTHER): Payer: BC Managed Care – PPO | Admitting: Family Medicine

## 2020-07-25 ENCOUNTER — Encounter: Payer: Self-pay | Admitting: Family Medicine

## 2020-07-25 VITALS — Wt 252.0 lb

## 2020-07-25 DIAGNOSIS — J4520 Mild intermittent asthma, uncomplicated: Secondary | ICD-10-CM | POA: Insufficient documentation

## 2020-07-25 DIAGNOSIS — E1169 Type 2 diabetes mellitus with other specified complication: Secondary | ICD-10-CM

## 2020-07-25 DIAGNOSIS — F339 Major depressive disorder, recurrent, unspecified: Secondary | ICD-10-CM

## 2020-07-25 DIAGNOSIS — E1165 Type 2 diabetes mellitus with hyperglycemia: Secondary | ICD-10-CM | POA: Diagnosis not present

## 2020-07-25 DIAGNOSIS — E785 Hyperlipidemia, unspecified: Secondary | ICD-10-CM

## 2020-07-25 MED ORDER — ESCITALOPRAM OXALATE 10 MG PO TABS: 10.0000 mg | ORAL_TABLET | Freq: Every day | ORAL | 1 refills | Status: DC

## 2020-07-25 MED ORDER — ROSUVASTATIN CALCIUM 5 MG PO TABS: 2.5000 mg | ORAL_TABLET | Freq: Every day | ORAL | 3 refills | Status: DC

## 2020-07-25 NOTE — Progress Notes (Signed)
Virtual Visit via Video Note  I connected with Lindsay Sanchez on 07/25/20 at  1:00 PM EDT by a video enabled telemedicine application and verified that I am speaking with the correct person using two identifiers.  Location patient: home Location provider: [facility]  [address] [place], [place] [address] Persons participating in the virtual visit: patient, provider  I discussed the limitations of evaluation and management by telemedicine and the availability of in person appointments. The patient expressed understanding and agreed to proceed.   Lindsay Sanchez DOB: 1971/05/30 Encounter date: 07/25/2020  This is a 49 y.o. female who presents with Chief Complaint  Patient presents with  . Follow-up    History of present illness: A1c increased from January to August from 7.6 to 8.8.  Of note, was on steroids for sinus infection early in August. 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   Type 2 diabetes: has been strict on keto but not in last month. Sometimes will drop in middle of day when very active. Sugars were bumped up in to the 200's when she was on the steroid. Usually in the 130's. If she takes out carbs too much then she will drop, so usually does 30-40 carbs/day.   Walks 4 days/week. Uses inhaler with walking or when it is really hot. Otherwise just prn maybe 2-3 times/week.   Took crestor but didn't see difference with it.   Would like to start back on the lexapro that gyn writes for sometimes. Hard time of year (anniversary of brothers' death). Mood has been sad.   Allergies  Allergen Reactions  . Bee Venom Anaphylaxis  . Actos [Pioglitazone]     Itching everywhere  . Jardiance [Empagliflozin]     Excessive urination  . Metformin And Related     Diarrhea, intermittent. Leg numbness.  . Other     Ragweed and Pollen  . Adhesive [Tape] Rash     And itching (when left on for extended time)   Current Meds  Medication Sig  . albuterol (PROVENTIL HFA;VENTOLIN HFA) 108 (90 Base) MCG/ACT inhaler Inhale 2 puffs into the lungs every 6 (six) hours as needed.  . cetirizine (ZYRTEC) 10 MG tablet Take 10 mg by mouth at bedtime.  Marland Kitchen loratadine (CLARITIN) 10 MG tablet Take 10 mg by mouth every morning.  . Multiple Vitamin (MULTIVITAMIN) tablet Take 1 tablet by mouth daily.    Review of Systems  Constitutional: Negative for chills, fatigue and fever.  Respiratory: Negative for cough, chest tightness, shortness of breath and wheezing.   Cardiovascular: Negative for chest pain, palpitations and leg swelling.    Objective:  Wt 252 lb (114.3 kg)   BMI 41.93 kg/m   Weight: 252 lb (114.3 kg)   BP Readings from Last 3 Encounters:  06/24/20 140/70  01/18/20 136/72  01/25/19 136/80   Wt Readings from Last 3 Encounters:  07/25/20 252 lb (114.3 kg)  01/18/20 290 lb 12.8 oz (131.9 kg)  11/09/19 245 lb (111.1 kg)   EXAM:  GENERAL: alert, oriented, appears well and in no acute distress  HEENT: atraumatic, conjunctiva clear, no obvious abnormalities on inspection of external nose and ears  NECK: normal movements of the head and neck  LUNGS: on inspection no signs of respiratory distress, breathing rate appears normal, no obvious gross SOB, gasping or wheezing  CV: no obvious cyanosis  MS: moves all visible extremities without  noticeable abnormality  PSYCH/NEURO: pleasant and cooperative, no obvious depression or anxiety, speech and thought processing grossly intact   Assessment/Plan 1. Uncontrolled type 2 diabetes mellitus with hyperglycemia (HCC) She is back on track with eating now and off steroids.  She had an extremely difficult time tolerating diabetic medications in the past and prefers to control sugar with diet alone.  We will plan to recheck A1c in 3 months time to make sure she is in fact improving on numbers overall. -  Hemoglobin A1c; Future  2. Mild intermittent asthma without complication Breathing is stable overall.  She is working on allergy control which helps with breathing.  Albuterol as needed.  3. Hyperlipidemia associated with type 2 diabetes mellitus (HCC) She is willing to try Crestor.  Advised taking every other night and may be just doing a half a tablet to see how she tolerates this medication.  Increase as tolerated to 1 tablet nightly.  Recheck cholesterol in 3 months time.  We discussed benefit of stabilization of cholesterol plaques on this medication. - Comprehensive metabolic panel; Future - Lipid panel; Future  4. Depression, recurrent (HCC) Previously been on Lexapro and tolerated this well.  We will plan to restart.  Has been on 10 mg in the past.  She will update me in 2 weeks with how mood is doing.  I discussed the assessment and treatment plan with the patient. The patient was provided an opportunity to ask questions and all were answered. The patient agreed with the plan and demonstrated an understanding of the instructions.   The patient was advised to call back or seek an in-person evaluation if the symptoms worsen or if the condition fails to improve as anticipated.  I provided 35 minutes of non-face-to-face time during this encounter.    Theodis Shove, MD

## 2020-07-30 ENCOUNTER — Telehealth: Payer: Self-pay | Admitting: *Deleted

## 2020-07-30 NOTE — Telephone Encounter (Signed)
-----   Message from Wynn Banker, MD sent at 07/25/2020  1:31 PM EDT ----- Patient would like to get flu shot. She also needs lab appointment for 3 mo time followed by in office visit.

## 2020-07-30 NOTE — Telephone Encounter (Signed)
Spoke with the pt and scheduled appts as below.  

## 2020-08-07 ENCOUNTER — Other Ambulatory Visit: Payer: Self-pay

## 2020-08-08 ENCOUNTER — Ambulatory Visit (INDEPENDENT_AMBULATORY_CARE_PROVIDER_SITE_OTHER): Payer: BC Managed Care – PPO | Admitting: *Deleted

## 2020-08-08 ENCOUNTER — Telehealth: Payer: Self-pay | Admitting: *Deleted

## 2020-08-08 DIAGNOSIS — Z23 Encounter for immunization: Secondary | ICD-10-CM

## 2020-08-08 MED ORDER — ACCU-CHEK GUIDE ME W/DEVICE KIT: PACK | 0 refills | Status: DC

## 2020-08-08 MED ORDER — ACCU-CHEK FASTCLIX LANCETS MISC: 3 refills | Status: DC

## 2020-08-08 MED ORDER — ACCU-CHEK GUIDE VI STRP: ORAL_STRIP | 3 refills | Status: DC

## 2020-08-08 NOTE — Telephone Encounter (Signed)
Patient came in as a nurses visit and stating the One Touch glucometer she has is broken.  I gave the pt a voucher to get a free Accu Chek Guide Me, printed Rx for the meter was given to the pt.  Rx for strips and lancets were sent to [facility] and the pt is aware to call if back if her insurance will not cover this.

## 2020-10-02 ENCOUNTER — Telehealth: Payer: Self-pay | Admitting: *Deleted

## 2020-10-02 NOTE — Telephone Encounter (Signed)
A message was left, re: her follow up visit. 

## 2020-10-27 ENCOUNTER — Other Ambulatory Visit: Payer: BC Managed Care – PPO

## 2020-10-27 ENCOUNTER — Other Ambulatory Visit: Payer: Self-pay

## 2020-10-27 DIAGNOSIS — E1165 Type 2 diabetes mellitus with hyperglycemia: Secondary | ICD-10-CM

## 2020-10-27 DIAGNOSIS — E1169 Type 2 diabetes mellitus with other specified complication: Secondary | ICD-10-CM | POA: Diagnosis not present

## 2020-10-27 DIAGNOSIS — E785 Hyperlipidemia, unspecified: Secondary | ICD-10-CM | POA: Diagnosis not present

## 2020-10-28 LAB — LIPID PANEL
Cholesterol: 175 mg/dL (ref <200)
HDL: 73 mg/dL (ref >=50)
LDL Cholesterol (Calc): 83 mg/dL (calc)
Non-HDL Cholesterol (Calc): 102 mg/dL (calc) (ref <130)
Total CHOL/HDL Ratio: 2.4 (calc) (ref <5.0)
Triglycerides: 95 mg/dL (ref <150)

## 2020-10-28 LAB — COMPREHENSIVE METABOLIC PANEL
AG Ratio: 1.5 (calc) (ref 1.0–2.5)
ALT: 18 U/L (ref 6–29)
AST: 15 U/L (ref 10–35)
Albumin: 4 g/dL (ref 3.6–5.1)
Alkaline phosphatase (APISO): 60 U/L (ref 37–153)
BUN: 15 mg/dL (ref 7–25)
CO2: 29 mmol/L (ref 20–32)
Calcium: 9.1 mg/dL (ref 8.6–10.4)
Chloride: 102 mmol/L (ref 98–110)
Creat: 0.65 mg/dL (ref 0.50–1.05)
Globulin: 2.7 g/dL (calc) (ref 1.9–3.7)
Glucose, Bld: 265 mg/dL — ABNORMAL HIGH (ref 65–99)
Potassium: 4.2 mmol/L (ref 3.5–5.3)
Sodium: 139 mmol/L (ref 135–146)
Total Bilirubin: 0.5 mg/dL (ref 0.2–1.2)
Total Protein: 6.7 g/dL (ref 6.1–8.1)

## 2020-10-28 LAB — HEMOGLOBIN A1C
Hgb A1c MFr Bld: 9.9 % of total Hgb — ABNORMAL HIGH (ref <5.7)
Mean Plasma Glucose: 237 mg/dL
eAG (mmol/L): 13.2 mmol/L

## 2020-10-31 ENCOUNTER — Other Ambulatory Visit: Payer: Self-pay

## 2020-10-31 ENCOUNTER — Encounter: Payer: Self-pay | Admitting: Family Medicine

## 2020-10-31 ENCOUNTER — Telehealth: Payer: Self-pay | Admitting: Family Medicine

## 2020-10-31 ENCOUNTER — Ambulatory Visit: Payer: BC Managed Care – PPO | Admitting: Family Medicine

## 2020-10-31 VITALS — BP 148/90 | HR 83 | Temp 98.0°F | Ht 65.0 in | Wt 292.9 lb

## 2020-10-31 DIAGNOSIS — F339 Major depressive disorder, recurrent, unspecified: Secondary | ICD-10-CM | POA: Diagnosis not present

## 2020-10-31 DIAGNOSIS — R030 Elevated blood-pressure reading, without diagnosis of hypertension: Secondary | ICD-10-CM | POA: Diagnosis not present

## 2020-10-31 DIAGNOSIS — J4520 Mild intermittent asthma, uncomplicated: Secondary | ICD-10-CM

## 2020-10-31 DIAGNOSIS — E1169 Type 2 diabetes mellitus with other specified complication: Secondary | ICD-10-CM

## 2020-10-31 DIAGNOSIS — E1165 Type 2 diabetes mellitus with hyperglycemia: Secondary | ICD-10-CM

## 2020-10-31 DIAGNOSIS — E785 Hyperlipidemia, unspecified: Secondary | ICD-10-CM

## 2020-10-31 MED ORDER — LOSARTAN POTASSIUM 25 MG PO TABS: 25.0000 mg | ORAL_TABLET | Freq: Every day | ORAL | 1 refills | Status: DC

## 2020-10-31 MED ORDER — RYBELSUS 3 MG PO TABS: 3.0000 mg | ORAL_TABLET | Freq: Every day | ORAL | 1 refills | Status: DC

## 2020-10-31 NOTE — Progress Notes (Signed)
Lindsay Sanchez DOB: 1971-08-07 Encounter date: 10/31/2020  This is a 49 y.o. female who presents with Chief Complaint  Patient presents with  . Follow-up    History of present illness: Last visit with me was 07/25/2020.  Having harder time getting sugar back down; had a rough couple of months and was eating worse than she usually does. Does feel she is moving in the right direction - but she is staying in the mid 200's fasting. In afternoon will drop to 180 if active. Got frustrated with diet. Wasn't being as good. She is still very restricted with carbs. Has been good since thanksgiving.   Diabetes II: Has been controlling through keto diet.  At last visit, we saw an increase of A1c from 7.6-8.8, and she suspected it was secondary to being on steroids for sinus issues.  Recurrent depression: Lexapro 10 mg daily. Does feel like this has been helpful for her.   Seasonal allergies: Claritin 10 mg daily  Hyperlipidemia: Crestor 2.5 mg daily  She was anxious about coming in due to bp numbers being elevated.   Allergies  Allergen Reactions  . Bee Venom Anaphylaxis  . Actos [Pioglitazone]     Itching everywhere  . Jardiance [Empagliflozin]     Excessive urination  . Metformin And Related     Diarrhea, intermittent. Leg numbness.  . Other     Ragweed and Pollen  . Adhesive [Tape] Rash    And itching (when left on for extended time)   Current Meds  Medication Sig  . Accu-Chek FastClix Lancets MISC Use as directed  . albuterol (PROVENTIL HFA;VENTOLIN HFA) 108 (90 Base) MCG/ACT inhaler Inhale 2 puffs into the lungs every 6 (six) hours as needed.  . Blood Glucose Monitoring Suppl (ACCU-CHEK GUIDE ME) w/Device KIT Use as directed  . cetirizine (ZYRTEC) 10 MG tablet Take 10 mg by mouth at bedtime.  Marland Kitchen escitalopram (LEXAPRO) 10 MG tablet Take 1 tablet (10 mg total) by mouth daily.  Marland Kitchen glucose blood (ACCU-CHEK GUIDE) test strip Use as instructed  . loratadine (CLARITIN) 10 MG  tablet Take 10 mg by mouth every morning.  . Multiple Vitamin (MULTIVITAMIN) tablet Take 1 tablet by mouth daily.  . rosuvastatin (CRESTOR) 5 MG tablet Take 0.5 tablets (2.5 mg total) by mouth daily.    Review of Systems  Objective:  BP (!) 148/90   Pulse 83   Temp 98 F (36.7 C) (Oral)   Ht 5' 5"  (1.651 m)   Wt 292 lb 14.4 oz (132.9 kg)   BMI 48.74 kg/m   Weight: 292 lb 14.4 oz (132.9 kg)   BP Readings from Last 3 Encounters:  10/31/20 (!) 148/90  06/24/20 140/70  01/18/20 136/72   Wt Readings from Last 3 Encounters:  10/31/20 292 lb 14.4 oz (132.9 kg)  07/25/20 252 lb (114.3 kg)  01/18/20 290 lb 12.8 oz (131.9 kg)    Physical Exam Constitutional:      General: She is not in acute distress.    Appearance: She is well-developed and well-nourished. She is obese.  HENT:     Head: Normocephalic and atraumatic.  Cardiovascular:     Rate and Rhythm: Normal rate and regular rhythm.     Pulses: Intact distal pulses.     Heart sounds: Normal heart sounds. No murmur heard.   Pulmonary:     Effort: Pulmonary effort is normal.     Breath sounds: Normal breath sounds.  Abdominal:     General: Bowel sounds  are normal. There is no distension.     Palpations: Abdomen is soft.     Tenderness: There is no abdominal tenderness. There is no guarding.  Skin:    General: Skin is warm and dry.     Comments: Sensory exam of the foot is normal, tested with the monofilament. Good pulses, no lesions or ulcers, good peripheral pulses.  Psychiatric:        Mood and Affect: Mood and affect normal.        Judgment: Judgment normal.     Assessment/Plan  1. Mild intermittent asthma without complication Breathing has been stable. Using humidifier to help with congestion.   2. Uncontrolled type 2 diabetes mellitus with hyperglycemia ([place]) Will try rybelsus.  - Semaglutide (RYBELSUS) 3 MG TABS; Take 3 mg by mouth daily.  Dispense: 90 tablet; Refill: 1 - losartan (COZAAR) 25 MG tablet;  Take 1 tablet (25 mg total) by mouth daily.  Dispense: 90 tablet; Refill: 1 - [facility] DIABETES FOOT EXAM  3. Depression, recurrent ([place]) Continue with lexapro 26m daily.  4. Elevated blood pressure reading She is agreeable to start losartan to help with renal protection/bp control.  - losartan (COZAAR) 25 MG tablet; Take 1 tablet (25 mg total) by mouth daily.  Dispense: 90 tablet; Refill: 1  5. hyperlipidemima - continue with crestor 540m  Return in about 3 months (around 01/29/2021) for Chronic condition visit.    JuMicheline RoughMD

## 2020-10-31 NOTE — Telephone Encounter (Signed)
Pt is calling in stating that the diabetic medication (Rx Semaglutide (RYBELSUS) 3 MG) has been declined and would like to see if something else can be called in or if someone from our office can call the insurance company to see if they will approve the medication.

## 2020-10-31 NOTE — Telephone Encounter (Signed)
Please see if we can do prior auth on this? She has multiple allergies to diabetic meds. Thanks!

## 2020-10-31 NOTE — Patient Instructions (Signed)
Look up ACE inhibitors or ARB for renal protection with diabetes. Lisinopril and losartan are probably the most common. If blood pressure remains elevated these would be my first line suggestions.

## 2020-11-03 NOTE — Telephone Encounter (Signed)
The patient called to see about getting the PA for this medication and to have it changed to 30 tablets from 90 tablets.  Semaglutide (RYBELSUS) 3 MG TABS    Please contact the patient after the PA is completed to update the patient.

## 2020-11-05 NOTE — Telephone Encounter (Signed)
Prior auth sent to [email or web address]: [id number] and a note was received stating the request was approved 12/15-12/14/2022.  I spoke with Loletta Specter at [place], informed him of this and left a detailed message at the pts cell number.

## 2020-11-18 ENCOUNTER — Other Ambulatory Visit: Payer: Self-pay | Admitting: Family Medicine

## 2020-11-18 DIAGNOSIS — Z1231 Encounter for screening mammogram for malignant neoplasm of breast: Secondary | ICD-10-CM

## 2020-12-02 ENCOUNTER — Telehealth: Payer: Self-pay | Admitting: Family Medicine

## 2020-12-02 DIAGNOSIS — E1165 Type 2 diabetes mellitus with hyperglycemia: Secondary | ICD-10-CM

## 2020-12-02 MED ORDER — RYBELSUS 3 MG PO TABS: ORAL_TABLET | ORAL | 1 refills | Status: DC

## 2020-12-02 NOTE — Telephone Encounter (Signed)
Please have her double up on current pill (so take 2 of the 3mg  tabs = 6mg ) daily. Update me if not noting improving in morning sugar as we may need to add additional medication. Remember to exercise daily as this is a natural way to decrease sugars. She can send me sugar numbers through mychart next week to review.

## 2020-12-02 NOTE — Telephone Encounter (Signed)
Left a message for the pt to return my call.  

## 2020-12-02 NOTE — Telephone Encounter (Signed)
Patient is calling and wanted to speak to a nurse regarding medication Rybelsus, please advise. CB is [phone]

## 2020-12-02 NOTE — Telephone Encounter (Signed)
Patient informed of the message below.  Stated she only has 3 pills left and was informed a refill was sent to the pharmacy as below.

## 2020-12-02 NOTE — Telephone Encounter (Signed)
Patient wanted to let Dr Hassan Rowan know her glucose readings are still high-190-210's range in the mornings, drops after a meal to around 150-160's and she questioned if she needed a higher dose?  Message sent to PCP.

## 2020-12-03 NOTE — Telephone Encounter (Signed)
Per Martie Lee at [facility] [place] [facility] the Rx needs a prior authorization since the Rx states to take 2 tablets daily.  PA sent to [email or web address]- Key: [id number] pending approval. Patient informed of the message above and stated she read the insert which states to increase to a 7mg  pill after 30 days and she wanted to know what Dr recommended? Message sent to PCP.

## 2020-12-03 NOTE — Telephone Encounter (Signed)
Pt is calling to see if the pharmacy has sent anything back about the medication (semaglutide RYBELSUS) 3 MG that was sent on yesterday.  Pt would like to have a call back.

## 2020-12-05 MED ORDER — RYBELSUS 7 MG PO TABS: 1.0000 | ORAL_TABLET | Freq: Every day | ORAL | 3 refills | Status: DC

## 2020-12-05 NOTE — Addendum Note (Signed)
Addended by: Johnella Moloney on: 12/05/2020 11:24 AM   Modules accepted: Orders

## 2020-12-05 NOTE — Telephone Encounter (Signed)
Pt is calling to check the status of the below msg and she is aware that we are waiting on approval from the insurance company she stated that she would call the insurance company and give our office a call back.

## 2020-12-05 NOTE — Telephone Encounter (Signed)
We don't need a prior auth. I can send in new rx for the 7mg  tablet. I just wanted to give her option of using what she already had at home (the 3mg ) and increase that since she should have extra. If she got 90 tablets of the 3mg ; she can just take double until she runs out of rx; and then if sugars still high we can increase to the 14mg  dose. If she only got 30 of the 3mg  tablets originally, then please send in new rx for 7mg  ryblesus and do 30 day supply with 2 refills.

## 2020-12-05 NOTE — Telephone Encounter (Signed)
Patient informed of the message below and is aware the new Rx was sent to [facility].

## 2020-12-18 NOTE — Progress Notes (Signed)
Cardiology Office Note   Date:  12/18/2020   ID:  Lindsay, Sanchez [birth year], MRN [id number]  PCP:  Caren Macadam, MD  Cardiologist:   Jamorian Dimaria Martinique, MD   No chief complaint on file.     History of Present Illness: Lindsay Sanchez is a 50 y.o. female who presents for evaluation of chest pain. She has a history of DM type 2, morbid obesity, and family history of CAD. Father died at age 32 with MI and 2 cousins died at 37 and 28 respectively. She denies any symptoms of chest pain, dyspnea, or palpitations except when she has asthma attacks. She was evaluated with a stress Echo in 2013 that was normal. She was seen by Almyra Deforest, PA-C, in 12/2016 at which time she reported 3 episodes of chest pain that she described as a pressure that lasted for about 4 minutes before resolving without medications.  Lexiscan Myoview was ordered and showed normal wall motion and systolic function with no evidence of ischemia. Lower extremities dopplers/ABIs were also ordered and were normal. She was last seen in Dec 2020 and doing well having lost 100 lbs with a Keto diet. She has gained some weight back but is working on it again. She has been started on low dose Crestor, losartan and semiglutide.  She only notes chest tightness with inspiration when she is under stress. No exertional symptoms and stays active.     Past Medical History:  Diagnosis Date  . Anxiety    anxiety attacks are infrequent  . Asthma   . Chest tightness   . Diabetes ([place])   . Macular degeneration    per patient-treated by Dr Katy Fitch  . Meralgia paresthetica of left side 12/12/2015  . Obesity 09/27/2012  . Urinary frequency     Past Surgical History:  Procedure Laterality Date  . ABDOMINAL HYSTERECTOMY     precancer cells on pap smear; thinks left ovar remains  . CESAREAN SECTION    . FOOT SURGERY     cyst removal  . HAMMERTOE RECONSTRUCTION WITH WEIL OSTEOTOMY Right 12/25/2015   Procedure: RIGHT THIRD  HAMMERTOE CORRECTION, WEIL OSTEOTOMY;  Surgeon: Wylene Simmer, MD;  Location: [facility];  Service: Orthopedics;  Laterality: Right;  . HAND SURGERY    . HARDWARE REMOVAL Right 12/25/2015   Procedure: RIGHT FIFTH METATARSAL DEEP IMPLANT REMOVAL, EXOSTECTOMY;  Surgeon: Wylene Simmer, MD;  Location: [facility];  Service: Orthopedics;  Laterality: Right;  . MOUTH SURGERY     [facility]--removal of cyst per patient-awaiting path results  . NECK SURGERY     cervical fusion  . ROOT CANAL    . TONSILLECTOMY       Current Outpatient Medications  Medication Sig Dispense Refill  . Accu-Chek FastClix Lancets MISC Use as directed 102 each 3  . albuterol (PROVENTIL HFA;VENTOLIN HFA) 108 (90 Base) MCG/ACT inhaler Inhale 2 puffs into the lungs every 6 (six) hours as needed. 1 Inhaler 0  . Blood Glucose Monitoring Suppl (ACCU-CHEK GUIDE ME) w/Device KIT Use as directed 1 kit 0  . cetirizine (ZYRTEC) 10 MG tablet Take 10 mg by mouth at bedtime.    Marland Kitchen escitalopram (LEXAPRO) 10 MG tablet Take 1 tablet (10 mg total) by mouth daily. 90 tablet 1  . glucose blood (ACCU-CHEK GUIDE) test strip Use as instructed 100 each 3  . loratadine (CLARITIN) 10 MG tablet Take 10 mg by mouth every morning.    Marland Kitchen losartan (  COZAAR) 25 MG tablet Take 1 tablet (25 mg total) by mouth daily. 90 tablet 1  . Multiple Vitamin (MULTIVITAMIN) tablet Take 1 tablet by mouth daily.    . rosuvastatin (CRESTOR) 5 MG tablet Take 0.5 tablets (2.5 mg total) by mouth daily. 90 tablet 3  . Semaglutide (RYBELSUS) 3 MG TABS Take 2 tablets by mouth daily 60 tablet 1  . Semaglutide (RYBELSUS) 7 MG TABS Take 1 tablet by mouth daily. 30 tablet 3   No current facility-administered medications for this visit.    Allergies:   Bee venom, Actos [pioglitazone], Jardiance [empagliflozin], Metformin and related, Other, and Adhesive [tape]    Social History:  The patient  reports that she has never smoked. She has never  used smokeless tobacco. She reports current alcohol use. She reports that she does not use drugs.   Family History:  The patient's family history includes Arrhythmia in her mother; Breast cancer in her cousin, maternal aunt, and maternal aunt; Breast cancer (age of onset: 33) in her mother; Cancer in her maternal grandfather; Heart attack in her brother and maternal grandfather; Heart attack (age of onset: 22) in her father; Heart disease in her paternal grandmother; Ovarian cancer in her mother; Sudden Cardiac Death in her brother.    ROS:  Please see the history of present illness.   Otherwise, review of systems are positive for none.   All other systems are reviewed and negative.    PHYSICAL EXAM: VS:  There were no vitals taken for this visit. , BMI There is no height or weight on file to calculate BMI. GEN: Well nourished, well developed, in no acute distress  HEENT: normal  Neck: no JVD, carotid bruits, or masses Cardiac: RRR; no murmurs, rubs, or gallops,no edema  Respiratory:  clear to auscultation bilaterally, normal work of breathing GI: soft, nontender, nondistended, + BS MS: no deformity or atrophy  Skin: warm and dry, no rash Neuro:  Strength and sensation are intact Psych: euthymic mood, full affect   EKG:  EKG is ordered today. The ekg ordered today demonstrates NSR with normal Ecg. I have personally reviewed and interpreted this study.    Recent Labs: 07/11/2020: Hemoglobin 14.0; Platelets 201; TSH 2.09 10/27/2020: ALT 18; BUN 15; Creat 0.65; Potassium 4.2; Sodium 139    Lipid Panel    Component Value Date/Time   CHOL 175 10/27/2020 0823   TRIG 95 10/27/2020 0823   HDL 73 10/27/2020 0823   CHOLHDL 2.4 10/27/2020 0823   VLDL 20.0 12/14/2019 0939   LDLCALC 83 10/27/2020 0823      Wt Readings from Last 3 Encounters:  10/31/20 292 lb 14.4 oz (132.9 kg)  07/25/20 252 lb (114.3 kg)  01/18/20 290 lb 12.8 oz (131.9 kg)      Other studies Reviewed: Additional  studies/ records that were reviewed today include:   Myoview 01/19/2017:  The left ventricular ejection fraction is normal (55-65%).  Nuclear stress EF: 60%.  Blood pressure demonstrated a normal response to exercise.  There was no ST segment deviation noted during stress.  No T wave inversion was noted during stress.  This is a low risk study.  No reversible ischemia. LVEF 60% with normal wall motion. This is a low risk study.  ASSESSMENT AND PLAN:  1.  HTN well controlled  2. DM type 2 - last A1c 9.9%- per primary care.  3. HLD. Now on low dose Crestor. LDL went from 131 to 83. Ideally would like to see below  70. Will see how she responds to better BS control and weight loss.  4. Family history of early CAD.  5. Atypical chest pain. Negative Myoview in 2018. No concerning symptoms currently.   Current medicines are reviewed at length with the patient today.  The patient does not have concerns regarding medicines.  The following changes have been made:  no change  Labs/ tests ordered today include:  No orders of the defined types were placed in this encounter.    Disposition:   FU with me in 1 year  Signed, Presleigh Feldstein Martinique, MD  12/18/2020 7:16 AM    [facility] [address] [place], [place], [address] Phone [phone], Fax [phone]

## 2020-12-22 ENCOUNTER — Other Ambulatory Visit: Payer: Self-pay

## 2020-12-22 ENCOUNTER — Ambulatory Visit: Payer: BC Managed Care – PPO | Admitting: Cardiology

## 2020-12-22 ENCOUNTER — Encounter: Payer: Self-pay | Admitting: Cardiology

## 2020-12-22 VITALS — BP 116/90 | HR 76 | Ht 67.0 in | Wt 286.0 lb

## 2020-12-22 DIAGNOSIS — I10 Essential (primary) hypertension: Secondary | ICD-10-CM

## 2020-12-22 DIAGNOSIS — R0789 Other chest pain: Secondary | ICD-10-CM

## 2020-12-22 DIAGNOSIS — Z8249 Family history of ischemic heart disease and other diseases of the circulatory system: Secondary | ICD-10-CM | POA: Diagnosis not present

## 2020-12-22 DIAGNOSIS — E1169 Type 2 diabetes mellitus with other specified complication: Secondary | ICD-10-CM

## 2020-12-22 DIAGNOSIS — E669 Obesity, unspecified: Secondary | ICD-10-CM

## 2020-12-22 DIAGNOSIS — E785 Hyperlipidemia, unspecified: Secondary | ICD-10-CM | POA: Diagnosis not present

## 2020-12-23 ENCOUNTER — Other Ambulatory Visit: Payer: Self-pay | Admitting: Family Medicine

## 2020-12-26 ENCOUNTER — Telehealth: Payer: Self-pay | Admitting: Family Medicine

## 2020-12-26 ENCOUNTER — Other Ambulatory Visit: Payer: Self-pay | Admitting: Family Medicine

## 2020-12-26 NOTE — Telephone Encounter (Signed)
Did she have these side effects even on the lower dose (3 mg))?  We can certainly try a different category of medication if she is not managing well on this.  How have her sugars been doing?

## 2020-12-26 NOTE — Telephone Encounter (Signed)
Pt call and stated she want a call back about having side effect when she take Rybelsus and want a call back.

## 2020-12-26 NOTE — Telephone Encounter (Signed)
Patient informed of the message below.  Patient stated she had the same symptoms with the lower dose and glucose readings have been in the 160-170s in the AM, PM readings are lower.  Message sent to PCP.

## 2020-12-26 NOTE — Telephone Encounter (Signed)
Patient complains of constipation, tried stool softener, gas, tried Gas X, shortness of breath and chest pressure since she started Rybelsus.  Also complains of hoarseness, hair loss and read this could be side effects of a thyroid tumor.  Patient stated she was seen by cardiologist for regular check-up on Monday, had an EKG and this was normal.  Message sent to PCP.

## 2020-12-29 ENCOUNTER — Encounter: Payer: Self-pay | Admitting: Family Medicine

## 2020-12-29 ENCOUNTER — Telehealth: Payer: BC Managed Care – PPO | Admitting: Family Medicine

## 2020-12-29 ENCOUNTER — Telehealth: Payer: Self-pay

## 2020-12-29 VITALS — BP 130/92 | HR 87 | Temp 96.5°F

## 2020-12-29 DIAGNOSIS — R059 Cough, unspecified: Secondary | ICD-10-CM | POA: Diagnosis not present

## 2020-12-29 DIAGNOSIS — J4520 Mild intermittent asthma, uncomplicated: Secondary | ICD-10-CM

## 2020-12-29 DIAGNOSIS — E118 Type 2 diabetes mellitus with unspecified complications: Secondary | ICD-10-CM | POA: Diagnosis not present

## 2020-12-29 DIAGNOSIS — J019 Acute sinusitis, unspecified: Secondary | ICD-10-CM

## 2020-12-29 MED ORDER — GLIPIZIDE ER 2.5 MG PO TB24: 2.5000 mg | ORAL_TABLET | Freq: Every day | ORAL | 1 refills | Status: DC

## 2020-12-29 MED ORDER — AEROCHAMBER PLUS MISC
0 refills | Status: AC
Start: 2020-12-29 — End: ?

## 2020-12-29 MED ORDER — PREDNISONE 20 MG PO TABS
40.0000 mg | ORAL_TABLET | Freq: Every day | ORAL | 0 refills | Status: AC
Start: 2020-12-29 — End: 2021-01-03

## 2020-12-29 MED ORDER — AMOXICILLIN-POT CLAVULANATE 875-125 MG PO TABS: 1.0000 | ORAL_TABLET | Freq: Two times a day (BID) | ORAL | 0 refills | Status: DC

## 2020-12-29 MED ORDER — BUDESONIDE-FORMOTEROL FUMARATE 80-4.5 MCG/ACT IN AERO: 2.0000 | INHALATION_SPRAY | Freq: Two times a day (BID) | RESPIRATORY_TRACT | 12 refills | Status: DC

## 2020-12-29 NOTE — Progress Notes (Signed)
Virtual Visit via Video Note  I connected with Lindsay Sanchez  on 12/29/20 at 10:00 AM EST by a video enabled telemedicine application and verified that I am speaking with the correct person using two identifiers.  Location patient: home Location provider: [place], [place] [address] Persons participating in the virtual visit: patient, provider  I discussed the limitations of evaluation and management by telemedicine and the availability of in person appointments. The patient expressed understanding and agreed to proceed.   Sadia Belfiore DOB: 06-Jun-1971 Encounter date: 12/29/2020  This is a 50 y.o. female who presents with Chief Complaint  Patient presents with  . Cough    Productive with yellow-green sputum x2 days, states home Covid test was negative  . Nasal Congestion    Patient complains of nasal congestion and difficulty breathing x1 week    History of present illness: URI/sinus sx started last week. Initially stuffy nose, then progressed, post nasal drainage, sore throat, then cough x 2 days. Home covid test negative. Not tight in chest or wheezy. Usually goes into bronchitis with URI. No fevers. Has been taking mucinex 12hr BID. Still taking claritin and zyrtec daily. Hasn't used albuterol in last few days. Sneezing more in last couple of days. Started 7 days ago.   Lost sister in law on Thursday secondary to [place] so thought it was related to crying at start.   Has been doing simply saline as well every morning; sometimes blood in nose.   On 56m of rybelsus had constipation, which initially was improved with mirilax, but then with increase to 740m- had increased gas production - burping and lower gas issues as well. Has tried gas ex which helped, but has to take this every day. Also having headache, but also has sinus issues now; so might have been related to illness. Also hair is falling out. She had this side effect  with starting metformin.    Allergies  Allergen Reactions  . Bee Venom Anaphylaxis  . Actos [Pioglitazone]     Itching everywhere  . Jardiance [Empagliflozin]     Excessive urination  . Metformin And Related     Diarrhea, intermittent. Leg numbness.  . Other     Ragweed and Pollen  . Adhesive [Tape] Rash    And itching (when left on for extended time)   Current Meds  Medication Sig  . Accu-Chek FastClix Lancets MISC Use as directed  . albuterol (PROVENTIL HFA;VENTOLIN HFA) 108 (90 Base) MCG/ACT inhaler Inhale 2 puffs into the lungs every 6 (six) hours as needed.  . Blood Glucose Monitoring Suppl (ACCU-CHEK GUIDE ME) w/Device KIT Use as directed  . cetirizine (ZYRTEC) 10 MG tablet Take 10 mg by mouth at bedtime.  . Marland Kitchenscitalopram (LEXAPRO) 10 MG tablet Take 1 tablet by mouth once daily  . glucose blood (ACCU-CHEK GUIDE) test strip Use as instructed  . loratadine (CLARITIN) 10 MG tablet Take 10 mg by mouth every morning.  . Marland Kitchenosartan (COZAAR) 25 MG tablet Take 1 tablet (25 mg total) by mouth daily.  . Multiple Vitamin (MULTIVITAMIN) tablet Take 1 tablet by mouth daily.  . rosuvastatin (CRESTOR) 5 MG tablet Take 0.5 tablets (2.5 mg total) by mouth daily.  . Semaglutide (RYBELSUS) 7 MG TABS Take 1 tablet by mouth daily.    Review of Systems  Objective:  BP (!) 130/92   Pulse 87   Temp (!) 96.5 F (35.8 C)   SpO2 96%  BP Readings from Last 3 Encounters:  12/29/20 (!) 130/92  12/22/20 116/90  10/31/20 (!) 148/90   Wt Readings from Last 3 Encounters:  12/22/20 286 lb (129.7 kg)  10/31/20 292 lb 14.4 oz (132.9 kg)  07/25/20 252 lb (114.3 kg)    EXAM:  GENERAL: alert, oriented, appears well and in no acute distress  HEENT: atraumatic, conjunctiva clear, no obvious abnormalities on inspection of external nose and ears  NECK: normal movements of the head and neck  LUNGS: on inspection no signs of respiratory distress, breathing rate appears normal, no obvious gross  SOB, gasping or wheezing  CV: no obvious cyanosis  MS: moves all visible extremities without noticeable abnormality  PSYCH/NEURO: pleasant and cooperative, no obvious depression or anxiety, speech and thought processing grossly intact   Assessment/Plan  1. Cough Discussed that symptoms are likely viral given quick onset. Discussed trying to avoid abx use unless bacterial infection.  Discussed that in the past, when she required multiple rounds of antibiotics to clear infection, it was likely due to antibiotics being given too soon and infection not yet being truly a bacterial infection.  Encouraged her to work on inhaler use to help prevent reactive airway response.  If cough or breathing feels worse, can start prednisone.  We are trying to avoid prednisone if possible however due to elevated blood sugars.  I did encourage her to retest for Covid in another couple of days.  If Covid test is positive, this may change recommendations, so I encouraged her to let us know. - amoxicillin-clavulanate (AUGMENTIN) 875-125 MG tablet; Take 1 tablet by mouth 2 (two) times daily.  Dispense: 20 tablet; Refill: 0 - predniSONE (DELTASONE) 20 MG tablet; Take 2 tablets (40 mg total) by mouth daily with breakfast for 5 days.  Dispense: 10 tablet; Refill: 0  2. Acute sinusitis, recurrence not specified, unspecified location See above.  Suspect symptoms are viral at this time.  If worsening of sinus pain, pressure, fever, productive cough then consider starting Augmentin as directed.  3. Controlled type 2 diabetes mellitus with complication, without long-term current use of insulin ([place]) She has not been tolerating the Rybelsus well.  We are going to try to switch to a long-acting glipizide.  She has not tried this medication in the past.  She did try glimepiride, but this was in association with Metformin.  Discussed new medication(s) today with patient. Discussed potential side effects and patient verbalized  understanding.  - glipiZIDE (GLUCOTROL XL) 2.5 MG 24 hr tablet; Take 1 tablet (2.5 mg total) by mouth daily with breakfast.  Dispense: 90 tablet; Refill: 1  4. Mild intermittent asthma without complication Instructed her to use albuterol as well as Symbicort with spacing chamber. - Spacer/Aero-Holding Chambers (AEROCHAMBER PLUS) inhaler; Use as instructed  Dispense: 1 each; Refill: 0    I discussed the assessment and treatment plan with the patient. The patient was provided an opportunity to ask questions and all were answered. The patient agreed with the plan and demonstrated an understanding of the instructions.   The patient was advised to call back or seek an in-person evaluation if the symptoms worsen or if the condition fails to improve as anticipated.  I provided 30 minutes of non-face-to-face time during this encounter.  We discussed medication treatment options for diabetes, we discussed holding off on antibiotics until they may be needed for a bacterial infection, we discussed treatment to prevent asthma exacerbation.  Micheline Rough, MD

## 2020-12-29 NOTE — Telephone Encounter (Signed)
Patient called in wanting to know if provider would send in something similar to medication that was sent in this morning... it will cost her $300 and the insurance is only paying $70. She is not able to afford the medication    budesonide-formoterol (SYMBICORT) 80-4.5 MCG/ACT inhaler   This the medication she is not able to afford   If new Rx can be sent in can it be sent to   [facility] [address] [place] - [address]  Please call and advise

## 2020-12-30 ENCOUNTER — Other Ambulatory Visit: Payer: Self-pay | Admitting: Family Medicine

## 2020-12-30 MED ORDER — FLUTICASONE-SALMETEROL 250-50 MCG/DOSE IN AEPB: 1.0000 | INHALATION_SPRAY | Freq: Two times a day (BID) | RESPIRATORY_TRACT | 2 refills | Status: DC

## 2020-12-30 NOTE — Telephone Encounter (Signed)
Patient had virtual visit and we reviewed/changed medications.

## 2020-12-30 NOTE — Telephone Encounter (Signed)
Left a message for the pt to return my call.  

## 2020-12-30 NOTE — Telephone Encounter (Signed)
Ok. I send advair generic - looks like it may be better? But I don't see anything lower tier with inhalers on her formulary; so i'm not sure about cost for her with this either. She can check with pharmacy about coupons they may have? Otherwise we may have to do the oral prednisone.

## 2020-12-30 NOTE — Telephone Encounter (Signed)
Patient returned JoAnne's call 

## 2020-12-30 NOTE — Telephone Encounter (Signed)
Spoke with the pt and informed her of the message below.  Patient stated she went to the pharmacy and was told the generic Rx was not covered, the brand Symbicort was covered and she was able to purchase this for $90.

## 2020-12-31 ENCOUNTER — Telehealth: Payer: Self-pay | Admitting: Family Medicine

## 2020-12-31 NOTE — Telephone Encounter (Signed)
Patient informed of the message below.

## 2020-12-31 NOTE — Telephone Encounter (Signed)
Ok good. Keep me posted if not feeling better or if any worsening of symptoms.

## 2020-12-31 NOTE — Telephone Encounter (Signed)
Patient called to let Dr. Hassan Rowan know that her test was negative.

## 2021-01-12 LAB — HEMOGLOBIN A1C: Hemoglobin A1C: 8

## 2021-01-16 ENCOUNTER — Ambulatory Visit
Admission: RE | Admit: 2021-01-16 | Discharge: 2021-01-16 | Disposition: A | Discharge status: Home / Self Care | Payer: BC Managed Care – PPO | Source: Ambulatory Visit | Attending: Family Medicine | Admitting: Family Medicine

## 2021-01-16 ENCOUNTER — Other Ambulatory Visit: Payer: Self-pay

## 2021-01-16 DIAGNOSIS — Z1231 Encounter for screening mammogram for malignant neoplasm of breast: Secondary | ICD-10-CM

## 2021-01-26 ENCOUNTER — Other Ambulatory Visit: Payer: BC Managed Care – PPO

## 2021-01-29 ENCOUNTER — Other Ambulatory Visit: Payer: Self-pay

## 2021-01-30 ENCOUNTER — Encounter: Payer: Self-pay | Admitting: Family Medicine

## 2021-01-30 ENCOUNTER — Ambulatory Visit (INDEPENDENT_AMBULATORY_CARE_PROVIDER_SITE_OTHER): Payer: BC Managed Care – PPO

## 2021-01-30 ENCOUNTER — Ambulatory Visit: Payer: BC Managed Care – PPO | Admitting: Family Medicine

## 2021-01-30 VITALS — BP 118/80 | HR 80 | Temp 98.3°F | Ht 67.0 in | Wt 286.2 lb

## 2021-01-30 DIAGNOSIS — M7062 Trochanteric bursitis, left hip: Secondary | ICD-10-CM

## 2021-01-30 DIAGNOSIS — M19071 Primary osteoarthritis, right ankle and foot: Secondary | ICD-10-CM | POA: Diagnosis not present

## 2021-01-30 DIAGNOSIS — E118 Type 2 diabetes mellitus with unspecified complications: Secondary | ICD-10-CM

## 2021-01-30 DIAGNOSIS — M79671 Pain in right foot: Secondary | ICD-10-CM | POA: Diagnosis not present

## 2021-01-30 DIAGNOSIS — M7989 Other specified soft tissue disorders: Secondary | ICD-10-CM | POA: Diagnosis not present

## 2021-01-30 NOTE — Patient Instructions (Addendum)
Lindsay Sanchez: chiropractor [address]; [phone]  (avoid strengthening exercises)  Hip Bursitis Rehab Ask your health care provider which exercises are safe for you. Do exercises exactly as told by your health care provider and adjust them as directed. It is normal to feel mild stretching, pulling, tightness, or discomfort as you do these exercises. Stop right away if you feel sudden pain or your pain gets worse. Do not begin these exercises until told by your health care provider. Stretching exercise This exercise warms up your muscles and joints and improves the movement and flexibility of your hip. This exercise also helps to relieve pain and stiffness. Iliotibial band stretch An iliotibial band is a strong band of muscle tissue that runs from the outer side of your hip to the outer side of your thigh and knee. 1. Lie on your side with your left / right leg in the top position. 2. Bend your left / right knee and grab your ankle. Stretch out your bottom arm to help you balance. 3. Slowly bring your knee back so your thigh is behind your body. 4. Slowly lower your knee toward the floor until you feel a gentle stretch on the outside of your left / right thigh. If you do not feel a stretch and your knee will not fall farther, place the heel of your other foot on top of your knee and pull your knee down toward the floor with your foot. 5. Hold this position for __________ seconds. 6. Slowly return to the starting position. Repeat __________ times. Complete this exercise __________ times a day.   Strengthening exercises These exercises build strength and endurance in your hip and pelvis. Endurance is the ability to use your muscles for a long time, even after they get tired. Bridge This exercise strengthens the muscles that move your thigh backward (hip extensors). 1. Lie on your back on a firm surface with your knees bent and your feet flat on the floor. 2. Tighten your buttocks  muscles and lift your buttocks off the floor until your trunk is level with your thighs. ? Do not arch your back. ? You should feel the muscles working in your buttocks and the back of your thighs. If you do not feel these muscles, slide your feet 1-2 inches (2.5-5 cm) farther away from your buttocks. ? If this exercise is too easy, try doing it with your arms crossed over your chest. 3. Hold this position for __________ seconds. 4. Slowly lower your hips to the starting position. 5. Let your muscles relax completely after each repetition. Repeat __________ times. Complete this exercise __________ times a day.   Squats This exercise strengthens the muscles in front of your thigh and knee (quadriceps). 1. Stand in front of a table, with your feet and knees pointing straight ahead. You may rest your hands on the table for balance but not for support. 2. Slowly bend your knees and lower your hips like you are going to sit in a chair. ? Keep your weight over your heels, not over your toes. ? Keep your lower legs upright so they are parallel with the table legs. ? Do not let your hips go lower than your knees. ? Do not bend lower than told by your health care provider. ? If your hip pain increases, do not bend as low. 3. Hold the squat position for __________ seconds. 4. Slowly push with your legs to return to standing. Do not use your hands to pull  yourself to standing. Repeat __________ times. Complete this exercise __________ times a day. Hip hike 1. Stand sideways on a bottom step. Stand on your left / right leg with your other foot unsupported next to the step. You can hold on to the railing or wall for balance if needed. 2. Keep your knees straight and your torso square. Then lift your left / right hip up toward the ceiling. 3. Hold this position for __________ seconds. 4. Slowly let your left / right hip lower toward the floor, past the starting position. Your foot should get closer to the  floor. Do not lean or bend your knees. Repeat __________ times. Complete this exercise __________ times a day. Single leg stand 1. Without shoes, stand near a railing or in a doorway. You may hold on to the railing or door frame as needed for balance. 2. Squeeze your left / right buttock muscles, then lift up your other foot. ? Do not let your left / right hip push out to the side. ? It is helpful to stand in front of a mirror for this exercise so you can watch your hip. 3. Hold this position for __________ seconds. Repeat __________ times. Complete this exercise __________ times a day. This information is not intended to replace advice given to you by your health care provider. Make sure you discuss any questions you have with your health care provider. Document Revised: 03/05/2019 Document Reviewed: 03/05/2019 Elsevier Patient Education  2021 Elsevier Inc.  Hip Bursitis  Hip bursitis is the inflammation of one or more bursae in the hip joint. Bursae are small fluid-filled sacs that absorb shock and prevent bones from rubbing against each other. Hip bursitis can cause mild to moderate pain, and symptoms often come and go over time. What are the causes? This condition results from increased friction between the hip bones and the tendons around the hip joint. This condition can happen if you:  Overuse your hip muscles.  Injure your hip.  Have weak buttocks muscles.  Have bone spurs.  Have an infection. In some cases, the cause may not be known. What increases the risk? You are more likely to develop this condition if:  You injured your hip previously or had hip surgery.  You have a medical condition, such as arthritis, gout, diabetes, or thyroid disease.  You have spine problems.  You have one leg that is shorter than the other.  You participate in athletic activities that include repetitive motion, like running.  You participate in sports where there is a risk of injury or  falling, such as football, martial arts, or skiing. What are the signs or symptoms? Symptoms may come and go, and they often include:  Pain in the hip or groin area. Pain may get worse with movement.  Tenderness and swelling of the hip. In rare cases, the bursa may become infected. If this happens, you may get a fever, as well as warmth and redness in the hip area. How is this diagnosed? This condition may be diagnosed based on:  Your symptoms.  Your medical history.  A physical exam.  Imaging tests, such as: ? X-rays to check your bones. ? MRI or ultrasound to check your tendons and muscles. ? Bone scan.  A biopsy to remove fluid from your inflamed bursa for testing. How is this treated? This condition is treated by resting, icing, applying pressure (compression), and raising (elevating) the injured area. This is called RICE treatment. In some cases, RICE treatment  may not be enough to make your symptoms go away. Treatment may also include:  Taking medicine to help with swelling and pain.  Using crutches, a cane, or a walker to decrease the strain on your hip.  Getting a shot of cortisone medicine to help reduce swelling.  Taking other medicines if the bursa is infected.  Draining fluid out of the bursa to help relieve swelling.  Having surgery to remove a damaged or infected bursa. This is rare. Long-term treatment may include:  Physical therapy exercises for strength and flexibility.  Lifestyle changes, such as weight loss, to reduce the strain on the hip. Follow these instructions at home: Managing pain, stiffness, and swelling  If directed, put ice on the painful area. ? Put ice in a plastic bag. ? Place a towel between your skin and the bag. ? Leave the ice on for 20 minutes, 2-3 times a day.  Raise (elevate) your hip as much as you can without pain. To do this, put a pillow under your hips while you lie down.  If directed, apply heat to the affected area as  often as told by your health care provider. Use the heat source that your health care provider recommends, such as a moist heat pack or a heating pad. ? Place a towel between your skin and the heat source. ? Leave the heat on for 20-30 minutes. ? Remove the heat if your skin turns bright red. This is especially important if you are unable to feel pain, heat, or cold. You may have a greater risk of getting burned.      Activity  Do not use your hip to support your body weight until your health care provider says that you can. Use crutches, a cane, or a walker as told by your health care provider.  If the affected leg is one that you use to drive, ask your health care provider if it is safe to drive.  Rest and protect your hip as much as possible until your pain and swelling get better.  Return to your normal activities as told by your health care provider. Ask your health care provider what activities are safe for you.  Do exercises as told by your health care provider. General instructions  Take over-the-counter and prescription medicines only as told by your health care provider.  Gently massage and stretch your injured area as often as is comfortable.  Wear compression wraps only as told by your health care provider.  If one of your legs is shorter than the other, get fitted for a shoe insert or orthotic.  Maintain a healthy weight. Follow instructions from your health care provider for weight control. These may include dietary restrictions.  Keep all follow-up visits as told by your health care provider. This is important. How is this prevented?  Exercise regularly, as told by your health care provider.  Wear supportive footwear that is appropriate for your sport.  Warm up and stretch before being active. Cool down and stretch after being active.  Take breaks regularly from repetitive activity.  If an activity irritates your hip or causes pain, avoid the activity as much as  possible.  Avoid sitting down for long periods at a time. Where to find more information  American Academy of Orthopa[employer]geons: orthoinfo.aaos.org Contact a health care provider if:  You have a fever.  You develop new symptoms.  You have trouble walking or doing everyday activities.  You have pain that gets worse or  does not get better with medicine.  You develop red skin or a feeling of warmth in your hip area. Get help right away if:  You cannot move your hip.  You have severe pain.  You cannot control the muscles in your feet. Summary  Hip bursitis is the inflammation of one or more bursae in the hip joint. Bursae are small fluid-filled sacs that absorb shock and prevent bones from rubbing against each other.  Hip bursitis can cause hip or groin pain, and symptoms often come and go over time.  This condition is often treated by resting, icing, applying pressure (compression), and raising (elevating) the injured area. Other treatments may be needed. This information is not intended to replace advice given to you by your health care provider. Make sure you discuss any questions you have with your health care provider. Document Revised: 09/10/2019 Document Reviewed: 07/17/2018 Elsevier Patient Education  2021 [employer]

## 2021-01-30 NOTE — Progress Notes (Signed)
Lindsay Sanchez DOB: 13-Mar-1971 Encounter date: 01/30/2021  This is a 50 y.o. female who presents with Chief Complaint  Patient presents with   Follow-up    History of present illness: Last visit with me was virtual in 1 month ago.  At that time she was having cough and sinus congestion.  We have been working on blood sugar control.  She was not tolerating Rybelsus so we switched to long-acting glipizide.  Last A1c was 12/6. She has been working with [place] and doing well with this program. She had an A1C on 2/22 that was 8.   She had mammogram and has gyn visit set up for end of this month.   She has been on the glipizide 2.66m and only thing she noted was left leg tingling. She had this issue with metformin as well but meds were combined in past. Bothers her more with sleep. If she rolls over on left side it tingles/burns. She is standing on left leg more because she has something going on with foot - thinks stress fracture - has been wearing brace/compression sleeve. No known injury, but just standing/going up and down steps. Has bothered her 2-3 weeks and does seem to be getting better. Feels better with compression and snug shoe. Spreading out is when it hurts. She had noted knot on side of heel a couple of weeks ago.  Allergies  Allergen Reactions   Bee Venom Anaphylaxis   Actos [Pioglitazone]     Itching everywhere   Jardiance [Empagliflozin]     Excessive urination   Metformin And Related     Diarrhea, intermittent. Leg numbness.   Other     Ragweed and Pollen   Adhesive [Tape] Rash    And itching (when left on for extended time)   Current Meds  Medication Sig   Accu-Chek FastClix Lancets MISC Use as directed   albuterol (PROVENTIL HFA;VENTOLIN HFA) 108 (90 Base) MCG/ACT inhaler Inhale 2 puffs into the lungs every 6 (six) hours as needed.   Blood Glucose Monitoring Suppl (ACCU-CHEK GUIDE ME) w/Device KIT Use as directed   cetirizine (ZYRTEC) 10 MG tablet  Take 10 mg by mouth at bedtime.   escitalopram (LEXAPRO) 10 MG tablet Take 1 tablet by mouth once daily   Fluticasone-Salmeterol (ADVAIR DISKUS) 250-50 MCG/DOSE AEPB Inhale 1 puff into the lungs in the morning and at bedtime.   glipiZIDE (GLUCOTROL XL) 2.5 MG 24 hr tablet Take 1 tablet (2.5 mg total) by mouth daily with breakfast.   glucose blood (ACCU-CHEK GUIDE) test strip Use as instructed   loratadine (CLARITIN) 10 MG tablet Take 10 mg by mouth every morning.   losartan (COZAAR) 25 MG tablet Take 1 tablet (25 mg total) by mouth daily.   Multiple Vitamin (MULTIVITAMIN) tablet Take 1 tablet by mouth daily.   rosuvastatin (CRESTOR) 5 MG tablet Take 0.5 tablets (2.5 mg total) by mouth daily.   Spacer/Aero-Holding Chambers (AEROCHAMBER PLUS) inhaler Use as instructed   [DISCONTINUED] amoxicillin-clavulanate (AUGMENTIN) 875-125 MG tablet Take 1 tablet by mouth 2 (two) times daily.    Review of Systems  Constitutional: Negative for chills, fatigue and fever.  Respiratory: Negative for cough, chest tightness, shortness of breath and wheezing.   Cardiovascular: Negative for chest pain, palpitations and leg swelling.  Musculoskeletal:       Foot pain; see above. Does seem to be slightly improved.    Objective:  BP 118/90 (BP Location: Left Arm, Patient Position: Sitting, Cuff Size: Large)  Pulse 80    Temp 98.3 F (36.8 C) (Oral)    Ht 5' 7"  (1.702 m)    Wt 286 lb 3.2 oz (129.8 kg)    SpO2 98%    BMI 44.83 kg/m   Weight: 286 lb 3.2 oz (129.8 kg)   BP Readings from Last 3 Encounters:  01/30/21 118/90  12/29/20 (!) 130/92  12/22/20 116/90   Wt Readings from Last 3 Encounters:  01/30/21 286 lb 3.2 oz (129.8 kg)  12/22/20 286 lb (129.7 kg)  10/31/20 292 lb 14.4 oz (132.9 kg)    Physical Exam Constitutional:      General: She is not in acute distress.    Appearance: She is well-developed.  HENT:     Head: Normocephalic and atraumatic.  Cardiovascular:     Rate and  Rhythm: Normal rate and regular rhythm.     Heart sounds: Normal heart sounds. No murmur heard.   Pulmonary:     Effort: Pulmonary effort is normal.     Breath sounds: Normal breath sounds.  Abdominal:     General: Bowel sounds are normal. There is no distension.     Palpations: Abdomen is soft.     Tenderness: There is no abdominal tenderness. There is no guarding.  Musculoskeletal:        General: Normal range of motion.  Feet:     Comments: Lateral maleolar tenderness inferior; 3,4,5 metatarsal tenderness to palpation; tenderness over anterior ankle mortise.  Skin:    General: Skin is warm and dry.     Comments: Sensory exam of the foot is normal, tested with the monofilament. Good pulses, no lesions or ulcers, good peripheral pulses.  Psychiatric:        Judgment: Judgment normal.     Assessment/Plan  1. Controlled type 2 diabetes mellitus with complication, without long-term current use of insulin ([place]) She is doing well with Virta and with glipizide. I suspect that nerve/hip pain is related to bursitis and not med side effect, but unusual that she had a similar issue with this in the past.   2. Right foot pain Start with xray; further eval/treatment pending this. - DG Foot Complete Right; Future - DG Ankle Complete Right; Future  3. Greater trochanteric bursitis of left hip Ice hip twice daily for 20 minutes and then work on stretching. Consider Dr. Tamala Julian if not improving (she has other arthritis pains)   Return in about 3 months (around 05/02/2021) for Chronic condition visit with labwork first.    Micheline Rough, MD

## 2021-02-09 DIAGNOSIS — E118 Type 2 diabetes mellitus with unspecified complications: Secondary | ICD-10-CM | POA: Diagnosis not present

## 2021-02-10 ENCOUNTER — Telehealth (INDEPENDENT_AMBULATORY_CARE_PROVIDER_SITE_OTHER): Payer: BC Managed Care – PPO | Admitting: Family Medicine

## 2021-02-10 DIAGNOSIS — R0981 Nasal congestion: Secondary | ICD-10-CM | POA: Diagnosis not present

## 2021-02-10 DIAGNOSIS — R490 Dysphonia: Secondary | ICD-10-CM

## 2021-02-10 DIAGNOSIS — R059 Cough, unspecified: Secondary | ICD-10-CM | POA: Diagnosis not present

## 2021-02-10 MED ORDER — BENZONATATE 100 MG PO CAPS: 100.0000 mg | ORAL_CAPSULE | Freq: Three times a day (TID) | ORAL | 0 refills | Status: DC | PRN

## 2021-02-10 NOTE — Progress Notes (Signed)
Virtual Visit via Video Note  I connected with Lindsay Sanchez  on 02/10/21 at  4:00 PM EDT by a video enabled telemedicine application and verified that I am speaking with the correct person using two identifiers.  Location patient: home, [place] Location provider:work or home office Persons participating in the virtual visit: patient, provider  I discussed the limitations of evaluation and management by telemedicine and the availability of in person appointments. The patient expressed understanding and agreed to proceed.   HPI:  Acute telemedicine visit for nasal congestion, cough: -Onset: 4-5 days ago -Symptoms include: sore throat, scratchy throat, nasal congestion, cough, has not needed alb today -Denies: Cp, SOB, fever, body aches, NVD, inability to ear/drink/get out of bed - T 97% -Has tried: Symbicort, alb as needed, zyrtec, claritin, nasal saline -Pertinent past medical history:  -Pertinent medication allergies: -COVID-19 vaccine status: covid x2 + booster and flu shot  ROS: See pertinent positives and negatives per HPI.  Past Medical History:  Diagnosis Date  . Anxiety    anxiety attacks are infrequent  . Asthma   . Chest tightness   . Diabetes ([place])   . Macular degeneration    per patient-treated by Dr Katy Fitch  . Meralgia paresthetica of left side 12/12/2015  . Obesity 09/27/2012  . Urinary frequency     Past Surgical History:  Procedure Laterality Date  . ABDOMINAL HYSTERECTOMY     precancer cells on pap smear; thinks left ovar remains  . CESAREAN SECTION    . FOOT SURGERY     cyst removal  . HAMMERTOE RECONSTRUCTION WITH WEIL OSTEOTOMY Right 12/25/2015   Procedure: RIGHT THIRD HAMMERTOE CORRECTION, WEIL OSTEOTOMY;  Surgeon: Wylene Simmer, MD;  Location: [facility];  Service: Orthopedics;  Laterality: Right;  . HAND SURGERY    . HARDWARE REMOVAL Right 12/25/2015   Procedure: RIGHT FIFTH METATARSAL DEEP IMPLANT REMOVAL, EXOSTECTOMY;  Surgeon: Wylene Simmer, MD;   Location: [facility];  Service: Orthopedics;  Laterality: Right;  . MOUTH SURGERY     [facility] --removal of cyst per patient-awaiting path results  . NECK SURGERY     cervical fusion  . ROOT CANAL    . TONSILLECTOMY       Current Outpatient Medications:  .  benzonatate (TESSALON PERLES) 100 MG capsule, Take 1 capsule (100 mg total) by mouth 3 (three) times daily as needed., Disp: 20 capsule, Rfl: 0 .  Accu-Chek FastClix Lancets MISC, Use as directed, Disp: 102 each, Rfl: 3 .  albuterol (PROVENTIL HFA;VENTOLIN HFA) 108 (90 Base) MCG/ACT inhaler, Inhale 2 puffs into the lungs every 6 (six) hours as needed., Disp: 1 Inhaler, Rfl: 0 .  Blood Glucose Monitoring Suppl (ACCU-CHEK GUIDE ME) w/Device KIT, Use as directed, Disp: 1 kit, Rfl: 0 .  cetirizine (ZYRTEC) 10 MG tablet, Take 10 mg by mouth at bedtime., Disp: , Rfl:  .  escitalopram (LEXAPRO) 10 MG tablet, Take 1 tablet by mouth once daily, Disp: 90 tablet, Rfl: 0 .  glipiZIDE (GLUCOTROL XL) 2.5 MG 24 hr tablet, Take 1 tablet (2.5 mg total) by mouth daily with breakfast., Disp: 90 tablet, Rfl: 1 .  glucose blood (ACCU-CHEK GUIDE) test strip, Use as instructed, Disp: 100 each, Rfl: 3 .  loratadine (CLARITIN) 10 MG tablet, Take 10 mg by mouth every morning., Disp: , Rfl:  .  losartan (COZAAR) 25 MG tablet, Take 1 tablet (25 mg total) by mouth daily., Disp: 90 tablet, Rfl: 1 .  Multiple Vitamin (MULTIVITAMIN) tablet, Take  1 tablet by mouth daily., Disp: , Rfl:  .  rosuvastatin (CRESTOR) 5 MG tablet, Take 0.5 tablets (2.5 mg total) by mouth daily., Disp: 90 tablet, Rfl: 3 .  Spacer/Aero-Holding Chambers (AEROCHAMBER PLUS) inhaler, Use as instructed, Disp: 1 each, Rfl: 0  EXAM:  VITALS per patient if applicable:  GENERAL: alert, oriented, appears well and in no acute distress  HEENT: atraumatic, conjunttiva clear, no obvious abnormalities on inspection of external nose and ears  NECK: normal movements of the  head and neck  LUNGS: on inspection no signs of respiratory distress, breathing rate appears normal, no obvious gross SOB, gasping or wheezing  CV: no obvious cyanosis  MS: moves all visible extremities without noticeable abnormality  PSYCH/NEURO: pleasant and cooperative, no obvious depression or anxiety, speech and thought processing grossly intact  ASSESSMENT AND PLAN:  Discussed the following assessment and plan:  Cough  Nasal congestion  Hoarseness  -we discussed possible serious and likely etiologies, options for evaluation and workup, limitations of telemedicine visit vs in person visit, treatment, treatment risks and precautions. Pt prefers to treat via telemedicine empirically rather than in person at this moment.  Query viral upper respiratory illness most likely, discussed possibility of Covid or other illnesses as well.  She has done 1 Covid test at home and will do a second test to complete testing.  Opted for Tessalon for cough, analgesic if needed, nasal saline and short course of nasal decongestant.  Albuterol inhaler as needed. Work/School slipped offered:  declined Scheduled follow up with PCP offered: Agrees to schedule follow-up through her primary care office if needed. Advised to seek prompt in person care or schedule follow-up video visit with her primary care office if worsening, new symptoms arise, or if is not improving with treatment.  I discussed the assessment and treatment plan with the patient. The patient was provided an opportunity to ask questions and all were answered. The patient agreed with the plan and demonstrated an understanding of the instructions.     Lucretia Kern, DO

## 2021-02-10 NOTE — Patient Instructions (Addendum)
  HOME CARE TIPS:  -take one more covid test, if positive schedule follow up video visit if you wish to discuss treatment options. Treatments are best when given in the first 5-7 days of illness   -I sent the medication(s) we discussed to your pharmacy: Meds ordered this encounter  Medications  . benzonatate (TESSALON PERLES) 100 MG capsule    Sig: Take 1 capsule (100 mg total) by mouth 3 (three) times daily as needed.    Dispense:  20 capsule    Refill:  0    -Use your albuterol as needed per instructions.  Seek prompt follow-up care through your primary care office or urgent care if worsening, difficulty breathing, chest pain, albuterol is not working or you need it frequently, or other concerns.  -can use tylenol or aleve if needed for fevers, aches and pains per instructions  -can use nasal saline a few times per day if you have nasal congestion; sometimes  a short course of Afrin nasal spray for 3 days can help with symptoms as well  -stay hydrated, drink plenty of fluids and eat small healthy meals - avoid dairy  -can take 1000 IU ( ) Vit D3 and 100-500 mg of Vit C daily per instructions  -follow up with your doctor in 2-3 days unless improving and feeling better  -stay home while sick, except to seek medical care, and if you have COVID19 ideally it would be best to stay home for a full 10 days since the onset of symptoms PLUS one day of no fever and feeling better. Wear a good mask (such as N95 or KN95) if around others to reduce the risk of transmission.  It was nice to meet you today, and I really hope you are feeling better soon. I help [facility] out with telemedicine visits on Tuesdays and Thursdays and am available for visits on those days. If you have any concerns or questions following this visit please schedule a follow up visit with your Primary Care doctor or seek care at a local urgent care clinic to avoid delays in care.    Seek in person care or schedule a  follow up video visit promptly if your symptoms worsen, new concerns arise or you are not improving with treatment. Call 911 and/or seek emergency care if your symptoms are severe or life threatening.

## 2021-02-13 DIAGNOSIS — Z01419 Encounter for gynecological examination (general) (routine) without abnormal findings: Secondary | ICD-10-CM | POA: Diagnosis not present

## 2021-02-13 DIAGNOSIS — Z6830 Body mass index (BMI) 30.0-30.9, adult: Secondary | ICD-10-CM | POA: Diagnosis not present

## 2021-02-27 DIAGNOSIS — H1045 Other chronic allergic conjunctivitis: Secondary | ICD-10-CM | POA: Diagnosis not present

## 2021-02-27 DIAGNOSIS — H353131 Nonexudative age-related macular degeneration, bilateral, early dry stage: Secondary | ICD-10-CM | POA: Diagnosis not present

## 2021-02-27 DIAGNOSIS — E119 Type 2 diabetes mellitus without complications: Secondary | ICD-10-CM | POA: Diagnosis not present

## 2021-02-27 DIAGNOSIS — H04123 Dry eye syndrome of bilateral lacrimal glands: Secondary | ICD-10-CM | POA: Diagnosis not present

## 2021-03-11 ENCOUNTER — Other Ambulatory Visit: Payer: Self-pay | Admitting: Family Medicine

## 2021-04-27 ENCOUNTER — Other Ambulatory Visit: Payer: Self-pay | Admitting: Family Medicine

## 2021-04-27 ENCOUNTER — Other Ambulatory Visit (INDEPENDENT_AMBULATORY_CARE_PROVIDER_SITE_OTHER): Payer: BC Managed Care – PPO

## 2021-04-27 ENCOUNTER — Other Ambulatory Visit: Payer: Self-pay

## 2021-04-27 DIAGNOSIS — R030 Elevated blood-pressure reading, without diagnosis of hypertension: Secondary | ICD-10-CM

## 2021-04-27 DIAGNOSIS — E785 Hyperlipidemia, unspecified: Secondary | ICD-10-CM | POA: Diagnosis not present

## 2021-04-27 DIAGNOSIS — E1169 Type 2 diabetes mellitus with other specified complication: Secondary | ICD-10-CM

## 2021-04-27 DIAGNOSIS — E118 Type 2 diabetes mellitus with unspecified complications: Secondary | ICD-10-CM

## 2021-04-27 LAB — CBC WITH DIFFERENTIAL/PLATELET
Basophils Absolute: 0 10*3/uL (ref 0.0–0.1)
Basophils Relative: 0.6 % (ref 0.0–3.0)
Eosinophils Absolute: 0.1 10*3/uL (ref 0.0–0.7)
Eosinophils Relative: 1.3 % (ref 0.0–5.0)
HCT: 37.6 % (ref 36.0–46.0)
Hemoglobin: 12.6 g/dL (ref 12.0–15.0)
Lymphocytes Relative: 22.2 % (ref 12.0–46.0)
Lymphs Abs: 1.4 10*3/uL (ref 0.7–4.0)
MCHC: 33.5 g/dL (ref 30.0–36.0)
MCV: 87.4 fl (ref 78.0–100.0)
Monocytes Absolute: 0.5 10*3/uL (ref 0.1–1.0)
Monocytes Relative: 8.2 % (ref 3.0–12.0)
Neutro Abs: 4.2 10*3/uL (ref 1.4–7.7)
Neutrophils Relative %: 67.7 % (ref 43.0–77.0)
Platelets: 178 10*3/uL (ref 150.0–400.0)
RBC: 4.31 Mil/uL (ref 3.87–5.11)
RDW: 14.5 % (ref 11.5–15.5)
WBC: 6.1 10*3/uL (ref 4.0–10.5)

## 2021-04-27 LAB — LIPID PANEL
Cholesterol: 178 mg/dL (ref 0–200)
HDL: 78.5 mg/dL (ref >39.00)
LDL Cholesterol: 84 mg/dL (ref 0–99)
NonHDL: 99.53
Total CHOL/HDL Ratio: 2
Triglycerides: 77 mg/dL (ref 0.0–149.0)
VLDL: 15.4 mg/dL (ref 0.0–40.0)

## 2021-04-27 LAB — COMPREHENSIVE METABOLIC PANEL
ALT: 20 U/L (ref 0–35)
AST: 19 U/L (ref 0–37)
Albumin: 4.4 g/dL (ref 3.5–5.2)
Alkaline Phosphatase: 57 U/L (ref 39–117)
BUN: 24 mg/dL — ABNORMAL HIGH (ref 6–23)
CO2: 28 mEq/L (ref 19–32)
Calcium: 9.2 mg/dL (ref 8.4–10.5)
Chloride: 104 mEq/L (ref 96–112)
Creatinine, Ser: 0.73 mg/dL (ref 0.40–1.20)
GFR: 94.61 mL/min (ref >60.00)
Glucose, Bld: 147 mg/dL — ABNORMAL HIGH (ref 70–99)
Potassium: 4.5 mEq/L (ref 3.5–5.1)
Sodium: 140 mEq/L (ref 135–145)
Total Bilirubin: 0.4 mg/dL (ref 0.2–1.2)
Total Protein: 6.8 g/dL (ref 6.0–8.3)

## 2021-04-27 LAB — TSH: TSH: 2.07 u[IU]/mL (ref 0.35–4.50)

## 2021-04-27 LAB — HEMOGLOBIN A1C: Hgb A1c MFr Bld: 7.1 % — ABNORMAL HIGH (ref 4.6–6.5)

## 2021-04-30 ENCOUNTER — Other Ambulatory Visit: Payer: Self-pay

## 2021-05-01 ENCOUNTER — Encounter: Payer: Self-pay | Admitting: Family Medicine

## 2021-05-01 ENCOUNTER — Ambulatory Visit: Payer: BC Managed Care – PPO | Admitting: Family Medicine

## 2021-05-01 VITALS — BP 138/88 | HR 81 | Temp 98.0°F | Ht 67.0 in | Wt 290.8 lb

## 2021-05-01 DIAGNOSIS — E1169 Type 2 diabetes mellitus with other specified complication: Secondary | ICD-10-CM

## 2021-05-01 DIAGNOSIS — F339 Major depressive disorder, recurrent, unspecified: Secondary | ICD-10-CM | POA: Diagnosis not present

## 2021-05-01 DIAGNOSIS — E118 Type 2 diabetes mellitus with unspecified complications: Secondary | ICD-10-CM

## 2021-05-01 DIAGNOSIS — E785 Hyperlipidemia, unspecified: Secondary | ICD-10-CM

## 2021-05-01 MED ORDER — OZEMPIC (0.25 OR 0.5 MG/DOSE) 2 MG/1.5ML ~~LOC~~ SOPN: 0.2500 mg | PEN_INJECTOR | SUBCUTANEOUS | 5 refills | Status: DC

## 2021-05-01 MED ORDER — ESCITALOPRAM OXALATE 20 MG PO TABS: 20.0000 mg | ORAL_TABLET | Freq: Every day | ORAL | 1 refills | Status: DC

## 2021-05-01 NOTE — Patient Instructions (Signed)
Go to ozempic website - you can get coupon for med. Print this (or send to phone) to bring to pharmacy with you. Stop the glipizide once you are able to get this.

## 2021-05-01 NOTE — Progress Notes (Signed)
Lindsay Sanchez DOB: [birth year] Encounter date: 05/01/2021  This is a 50 y.o. female who presents with Chief Complaint  Patient presents with   Follow-up   Leg Pain    Patient complains of left lateral thigh pain and tingling sensation to the touch since starting Glipizide    History of present illness: She isn't sure if this is sciatic nerve or coming from meds. Needs to get back to chiropractor. She has taken on mother in law with dementia and has shortened work week to help care for her. They are visiting daily. Has also had 3 extra dogs this week and daughter is gone this week doing IVF (in [place] because it is much less expensive). Things should slow down a little today.   Still working with [place] program. They are checking in with her daily. Weight goes up and down. When she is very good with no carbs she will drop faster. Struggles to not just be meat eater (where sugars are best) where she doesn't feel well. A1C is down again. Did get shaky last night and sugar was 92. Once or twice a week will drop really low. In morning always 140-160. Drops after eating 30 minutes or so after she eats.   Sometimes difficulty with falling asleep. Cbd does help a little. Once she falls asleep then she does sleep "like a rock". Mood has been a little rough. More snappy, not wanting to go anywhere. 2 brothers died in 78 months, grandmother passed last year. Then additionally stressors above. (Tried paxil in the past).     Allergies  Allergen Reactions   Bee Venom Anaphylaxis   Actos [Pioglitazone]     Itching everywhere   Jardiance [Empagliflozin]     Excessive urination   Metformin And Related     Diarrhea, intermittent. Leg numbness.   Other     Ragweed and Pollen   Adhesive [Tape] Rash    And itching (when left on for extended time)   Current Meds  Medication Sig   Accu-Chek FastClix Lancets MISC Use as directed   albuterol (PROVENTIL HFA;VENTOLIN HFA) 108 (90 Base) MCG/ACT  inhaler Inhale 2 puffs into the lungs every 6 (six) hours as needed.   benzonatate (TESSALON PERLES) 100 MG capsule Take 1 capsule (100 mg total) by mouth 3 (three) times daily as needed.   Blood Glucose Monitoring Suppl (ACCU-CHEK GUIDE ME) w/Device KIT Use as directed   cetirizine (ZYRTEC) 10 MG tablet Take 10 mg by mouth at bedtime.   escitalopram (LEXAPRO) 10 MG tablet Take 1 tablet by mouth once daily   glipiZIDE (GLUCOTROL XL) 2.5 MG 24 hr tablet Take 1 tablet (2.5 mg total) by mouth daily with breakfast.   glucose blood (ACCU-CHEK GUIDE) test strip Use as instructed   loratadine (CLARITIN) 10 MG tablet Take 10 mg by mouth every morning.   losartan (COZAAR) 25 MG tablet Take 1 tablet (25 mg total) by mouth daily.   Multiple Vitamin (MULTIVITAMIN) tablet Take 1 tablet by mouth daily.   rosuvastatin (CRESTOR) 5 MG tablet Take 0.5 tablets (2.5 mg total) by mouth daily.   Spacer/Aero-Holding Chambers (AEROCHAMBER PLUS) inhaler Use as instructed    Review of Systems  Constitutional:  Negative for chills, fatigue and fever.  Respiratory:  Negative for cough, chest tightness, shortness of breath and wheezing.   Cardiovascular:  Negative for chest pain, palpitations and leg swelling.  Psychiatric/Behavioral:  Positive for agitation (has been shorter tempered, snappy with family).  Objective:  BP 138/88 (BP Location: Left Arm, Patient Position: Sitting, Cuff Size: Large)   Pulse 81   Temp 98 F (36.7 C) (Oral)   Ht '5\' 7"'  (1.702 m)   Wt 290 lb 12.8 oz (131.9 kg)   SpO2 99%   BMI 45.55 kg/m   Weight: 290 lb 12.8 oz (131.9 kg)   BP Readings from Last 3 Encounters:  05/01/21 138/88  01/30/21 118/80  12/29/20 (!) 130/92   Wt Readings from Last 3 Encounters:  05/01/21 290 lb 12.8 oz (131.9 kg)  01/30/21 286 lb 3.2 oz (129.8 kg)  12/22/20 286 lb (129.7 kg)    Physical Exam Constitutional:      General: She is not in acute distress.    Appearance: She is well-developed.   Cardiovascular:     Rate and Rhythm: Normal rate and regular rhythm.     Heart sounds: Normal heart sounds. No murmur heard.   No friction rub.  Pulmonary:     Effort: Pulmonary effort is normal. No respiratory distress.     Breath sounds: Normal breath sounds. No wheezing or rales.  Musculoskeletal:     Right lower leg: No edema.     Left lower leg: No edema.  Neurological:     Mental Status: She is alert and oriented to person, place, and time.  Psychiatric:        Behavior: Behavior normal.    Assessment/Plan 1. Controlled type 2 diabetes mellitus with complication, without long-term current use of insulin (HCC) Sugars are better controlled with A1C improvement, but having some lows. Discussed options including short acting glipizide but she was agreeable to trying ozempic at low dose - hoping this will help with sugar control, avoid hypoglycemia, and help with weight loss. She will let me know how she does with this. Rx printed and patient instructed to get coupon onlin.   2. Morbid obesity ([place]) See above. She is working on [employer]. We will continue to try and med adjust to help with weight loss.   3. Hyperlipidemia associated with type 2 diabetes mellitus ([place]) Improved control with crestor 45m every other day. She is tolerating this well.   4. Depression: slightly worse mood overall: we are going to increase lexapro to 237mdaily. Discussed if this is not helping; consider addition of wellbutrin or consideration for alternative SSRI or SNRI.   Return in about 1 month (around 05/31/2021) for Chronic condition visit - ok for virtual.      JuMicheline RoughMD

## 2021-05-03 ENCOUNTER — Other Ambulatory Visit: Payer: Self-pay | Admitting: Family Medicine

## 2021-05-03 DIAGNOSIS — E1165 Type 2 diabetes mellitus with hyperglycemia: Secondary | ICD-10-CM

## 2021-05-03 DIAGNOSIS — R030 Elevated blood-pressure reading, without diagnosis of hypertension: Secondary | ICD-10-CM

## 2021-05-08 ENCOUNTER — Telehealth: Payer: Self-pay | Admitting: Family Medicine

## 2021-05-08 NOTE — Telephone Encounter (Signed)
Can you help with this? Patient states she was told that ozempic was step therapy and that her pharmacy told her that they called our office already (I don't see message). She also states that the pharmacy told her that the online savings card was not helpful for her (that insurance covered more) and they didn't run it but then also stated that insurance didn't cover the ozempic? Hoping you have more tools to help figure this out. Appreciate it!

## 2021-05-08 NOTE — Telephone Encounter (Signed)
The patient is having an issue with getting Semaglutide,0.25 or 0.5MG /DOS, (OZEMPIC, 0.25 OR 0.5 MG/DOSE,) 2 MG/1.5ML SOPN    She stated that she was told that it is supposed to be a step therapy medication.  Please advise

## 2021-05-18 NOTE — Telephone Encounter (Signed)
Called patient to update her on the status. The insurance informed me that the step therapy requires that she tried and failed either metformin or a sulfonylurea, both of which she has tried and failed. The insurance agent faxed over the step therapy paperwork and will give to PCP's CMA to complete and fax back.  Once approved, the coupon card can be added on top of the insurance to lower the copay. The patient verbalized her understanding and is aware to call back with any other questions or concerns.

## 2021-05-18 NOTE — Telephone Encounter (Signed)
Pt is calling to check the status of the below msg and msg was sent to [facility] as well and she will call her back later.

## 2021-05-29 ENCOUNTER — Encounter: Payer: Self-pay | Admitting: Family Medicine

## 2021-05-29 ENCOUNTER — Telehealth (INDEPENDENT_AMBULATORY_CARE_PROVIDER_SITE_OTHER): Payer: BC Managed Care – PPO | Admitting: Family Medicine

## 2021-05-29 DIAGNOSIS — U071 COVID-19: Secondary | ICD-10-CM | POA: Diagnosis not present

## 2021-05-29 NOTE — Progress Notes (Signed)
Virtual Visit via Video Note  I connected with Lindsay Sanchez on 05/29/21 at  4:00 PM EDT by a video enabled telemedicine application 2/2 [id number] pandemic and verified that I am speaking with the correct person using two identifiers.  Location patient: home Location provider:work or home office Persons participating in the virtual visit: patient, provider  I discussed the limitations of evaluation and management by telemedicine and the availability of in person appointments. The patient expressed understanding and agreed to proceed.  Chief Complaint  Patient presents with   Covid Positive    Head congestion, sore throat, coughing, started Sunday. Tested Wednesday, been using simply saline and taking immune boost.    HPI: Pt endorses itchy eyes, rhinorrhea, postnasal drainage starting last Friday 7/1.  COVID test was negative 7/1.  Again tested negative for COVID on Sunday 7/3, prior to driving to [place] with her daughter.  On Monday felt a little worse,  developed congestion, productive cough, and HA.  Pt had a positive home test on Wed 7/6.  Currently endorses congestion, rhinorrhea, itchy throat, cough, sore throat, ear pressure and itching. Denies n/v, diarrhea, loss of taste or smell Using nasal saline, inhaler, elderberry, zinc, PPQ, and immune boost. Had some SOB with unloading the car yesterday.  Pt fully vaccinate and boosted.  Sick contacts include patient's husband who was sick last week.  Patient inquires about Ozempic approval.  Previously tried and failed metformin and several other medications for allergy list.  ROS: See pertinent positives and negatives per HPI.  Past Medical History:  Diagnosis Date   Anxiety    anxiety attacks are infrequent   Asthma    Chest tightness    Diabetes ([place])    Macular degeneration    per patient-treated by Dr Katy Fitch   Meralgia paresthetica of left side 12/12/2015   Obesity 09/27/2012   Urinary frequency     Past Surgical History:   Procedure Laterality Date   ABDOMINAL HYSTERECTOMY     precancer cells on pap smear; thinks left ovar remains   CESAREAN SECTION     FOOT SURGERY     cyst removal   HAMMERTOE RECONSTRUCTION WITH WEIL OSTEOTOMY Right 12/25/2015   Procedure: RIGHT THIRD HAMMERTOE CORRECTION, WEIL OSTEOTOMY;  Surgeon: Wylene Simmer, MD;  Location: [place];  Service: Orthopedics;  Laterality: Right;   HAND SURGERY     HARDWARE REMOVAL Right 12/25/2015   Procedure: RIGHT FIFTH METATARSAL DEEP IMPLANT REMOVAL, EXOSTECTOMY;  Surgeon: Wylene Simmer, MD;  Location: [place];  Service: Orthopedics;  Laterality: Right;   MOUTH SURGERY     [facility]--removal of cyst per patient-awaiting path results   NECK SURGERY     cervical fusion   ROOT CANAL     TONSILLECTOMY      Family History  Problem Relation Age of Onset   Arrhythmia Mother        V TACH   Ovarian cancer Mother    Breast cancer Mother 48   Heart attack Father 85   Heart attack Maternal Grandfather    Cancer Maternal Grandfather    Heart disease Paternal Grandmother    Sudden Cardiac Death Brother    Heart attack Brother    Breast cancer Maternal Aunt    Breast cancer Maternal Aunt    Breast cancer Cousin        maternal first cousin     Current Outpatient Medications:    Accu-Chek FastClix Lancets MISC, Use as directed, Disp: 102  each, Rfl: 3   albuterol (PROVENTIL HFA;VENTOLIN HFA) 108 (90 Base) MCG/ACT inhaler, Inhale 2 puffs into the lungs every 6 (six) hours as needed., Disp: 1 Inhaler, Rfl: 0   benzonatate (TESSALON PERLES) 100 MG capsule, Take 1 capsule (100 mg total) by mouth 3 (three) times daily as needed., Disp: 20 capsule, Rfl: 0   Blood Glucose Monitoring Suppl (ACCU-CHEK GUIDE ME) w/Device KIT, Use as directed, Disp: 1 kit, Rfl: 0   cetirizine (ZYRTEC) 10 MG tablet, Take 10 mg by mouth at bedtime., Disp: , Rfl:    escitalopram (LEXAPRO) 20 MG tablet, Take 1 tablet (20 mg total) by  mouth daily., Disp: 90 tablet, Rfl: 1   glipiZIDE (GLUCOTROL XL) 2.5 MG 24 hr tablet, Take 1 tablet (2.5 mg total) by mouth daily with breakfast., Disp: 90 tablet, Rfl: 1   glucose blood (ACCU-CHEK GUIDE) test strip, Use as instructed, Disp: 100 each, Rfl: 3   loratadine (CLARITIN) 10 MG tablet, Take 10 mg by mouth every morning., Disp: , Rfl:    losartan (COZAAR) 25 MG tablet, Take 1 tablet by mouth once daily, Disp: 90 tablet, Rfl: 1   Multiple Vitamin (MULTIVITAMIN) tablet, Take 1 tablet by mouth daily., Disp: , Rfl:    rosuvastatin (CRESTOR) 5 MG tablet, Take 0.5 tablets (2.5 mg total) by mouth daily., Disp: 90 tablet, Rfl: 3   Semaglutide,0.25 or 0.5MG/DOS, (OZEMPIC, 0.25 OR 0.5 MG/DOSE,) 2 MG/1.5ML SOPN, Inject 0.25 mg into the skin once a week., Disp: 1.5 mL, Rfl: 5   Spacer/Aero-Holding Chambers (AEROCHAMBER PLUS) inhaler, Use as instructed, Disp: 1 each, Rfl: 0  EXAM:  VITALS per patient if applicable: RR between 71-95 bpm  GENERAL: alert, oriented, appears well and in no acute distress  HEENT: atraumatic, conjunctiva clear, no obvious abnormalities on inspection of external nose and ears  NECK: normal movements of the head and neck  LUNGS: on inspection no signs of respiratory distress, breathing rate appears normal, no obvious gross SOB, gasping or wheezing  CV: no obvious cyanosis  MS: moves all visible extremities without noticeable abnormality  PSYCH/NEURO: pleasant and cooperative, no obvious depression or anxiety, speech and thought processing grossly intact  ASSESSMENT AND PLAN:  Discussed the following assessment and plan:  COVID-19 virus infection -positive at home test 05/27/2021 with symptoms starting 05/22/2021 -Continue supportive care including inhaler, rest, hydration, OTC cough/cold medication, gargling with warm salt water or Chloraseptic spray, etc. -Discussed r/b/a of antiviral medications, however outside of 5-day window for starting medication. -Advised  on duration quarantine -Given strict precautions  Follow-up as needed   I discussed the assessment and treatment plan with the patient. The patient was provided an opportunity to ask questions and all were answered. The patient agreed with the plan and demonstrated an understanding of the instructions.   The patient was advised to call back or seek an in-person evaluation if the symptoms worsen or if the condition fails to improve as anticipated.  Billie Ruddy, MD

## 2021-06-01 ENCOUNTER — Other Ambulatory Visit: Payer: Self-pay | Admitting: Family Medicine

## 2021-06-01 DIAGNOSIS — E118 Type 2 diabetes mellitus with unspecified complications: Secondary | ICD-10-CM

## 2021-06-02 ENCOUNTER — Telehealth: Payer: Self-pay | Admitting: *Deleted

## 2021-06-02 NOTE — Telephone Encounter (Signed)
-----   Message from Verner Chol, [facility] sent at 06/01/2021  2:47 PM EDT ----- Regarding: Ozempic PA Hi,  Can you please start a PA for Ozempic for Ms. Khurana? I wasn't sure if you saw my message in teams or not but I guess the pharmacy can't trigger one because it is coming across as a rejection and requiring step therapy. I was told that you could start a PA in covermymeds for her though and her insurance prefers it electronically.   Please let me know if you are unable to do so!  Thanks, Maddie

## 2021-06-02 NOTE — Telephone Encounter (Signed)
Prior auth sent via [email or web address]-Key: [id number].

## 2021-06-03 NOTE — Telephone Encounter (Signed)
[employer] called needing office notes faxed to them for the patient being on Ozempic for Type 2 Diabetes.   Fax # [phone]   Reference #: [id number]

## 2021-06-05 ENCOUNTER — Encounter: Payer: Self-pay | Admitting: Family Medicine

## 2021-06-05 ENCOUNTER — Telehealth (INDEPENDENT_AMBULATORY_CARE_PROVIDER_SITE_OTHER): Payer: BC Managed Care – PPO | Admitting: Family Medicine

## 2021-06-05 VITALS — HR 79 | Temp 97.0°F

## 2021-06-05 DIAGNOSIS — U071 COVID-19: Secondary | ICD-10-CM | POA: Diagnosis not present

## 2021-06-05 DIAGNOSIS — F339 Major depressive disorder, recurrent, unspecified: Secondary | ICD-10-CM | POA: Diagnosis not present

## 2021-06-05 DIAGNOSIS — E118 Type 2 diabetes mellitus with unspecified complications: Secondary | ICD-10-CM

## 2021-06-05 NOTE — Progress Notes (Signed)
Virtual Visit via Video Note  I connected with Lindsay Sanchez  on 06/05/21 at 11:30 AM EDT by a video enabled telemedicine application and verified that I am speaking with the correct person using two identifiers.  Location patient: home Location provider: [place], [place] [address] Persons participating in the virtual visit: patient, provider  I discussed the limitations of evaluation and management by telemedicine and the availability of in person appointments. The patient expressed understanding and agreed to proceed.   Lindsay Sanchez DOB: 03-25-1971 Encounter date: 06/05/2021  This is a 50 y.o. female who presents with Chief Complaint  Patient presents with   Follow-up     History of present illness: Saw Dr. Volanda Napoleon virtually 7/8 with covid positive test/symptoms. She states it never even phased her. Felt just like regular sinus cold. Had been walking all around [place].   Last visit with me was 05/01/2021.  We discussed her hard work at that time with a vertigo program and working on diet control.  We also increased Lexapro to 20 mg daily to help with mood. Feels like mood is much better. Staying busy. Feeling back to normal. Feels benefit from the increase.   Numbers have been higher due to being out of town - not eating same and having covid she feels caused her spike. Also using inhaler more and feels this bumps up sugars. Using symbicort but more on prn basis. Does better once weather is not so humid.  Hasn't had issues with low blood sugars, but has been doing more carbs. Tingling in leg with glipizide is bothering her; this is same side effect she had previously. She is tolerating it but hopeful insurance will approve ozempic for her.  Allergies  Allergen Reactions   Bee Venom Anaphylaxis   Actos [Pioglitazone]     Itching everywhere   Jardiance [Empagliflozin]     Excessive urination   Metformin And Related      Diarrhea, intermittent. Leg numbness.   Other     Ragweed and Pollen   Adhesive [Tape] Rash    And itching (when left on for extended time)   Current Meds  Medication Sig   Accu-Chek FastClix Lancets MISC Use as directed   albuterol (PROVENTIL HFA;VENTOLIN HFA) 108 (90 Base) MCG/ACT inhaler Inhale 2 puffs into the lungs every 6 (six) hours as needed.   benzonatate (TESSALON PERLES) 100 MG capsule Take 1 capsule (100 mg total) by mouth 3 (three) times daily as needed.   Blood Glucose Monitoring Suppl (ACCU-CHEK GUIDE ME) w/Device KIT Use as directed   cetirizine (ZYRTEC) 10 MG tablet Take 10 mg by mouth at bedtime.   escitalopram (LEXAPRO) 20 MG tablet Take 1 tablet (20 mg total) by mouth daily.   glipiZIDE (GLUCOTROL XL) 2.5 MG 24 hr tablet Take 1 tablet by mouth once daily with breakfast   glucose blood (ACCU-CHEK GUIDE) test strip Use as instructed   loratadine (CLARITIN) 10 MG tablet Take 10 mg by mouth every morning.   losartan (COZAAR) 25 MG tablet Take 1 tablet by mouth once daily   Multiple Vitamin (MULTIVITAMIN) tablet Take 1 tablet by mouth daily.   rosuvastatin (CRESTOR) 5 MG tablet Take 0.5 tablets (2.5 mg total) by mouth daily. (Patient taking differently: Take 5 mg by mouth every other day.)   Semaglutide,0.25 or 0.5MG /DOS, (OZEMPIC, 0.25 OR 0.5 MG/DOSE,) 2 MG/1.5ML SOPN Inject 0.25 mg into the skin once a week.   Spacer/Aero-Holding  Chambers (AEROCHAMBER PLUS) inhaler Use as instructed    Review of Systems  Constitutional:  Negative for chills, fatigue and fever.  Respiratory:  Negative for cough, chest tightness, shortness of breath and wheezing.   Cardiovascular:  Negative for chest pain, palpitations and leg swelling.   Objective:  Pulse 79   Temp (!) 97 F (36.1 C)       BP Readings from Last 3 Encounters:  05/01/21 138/88  01/30/21 118/80  12/29/20 (!) 130/92   Wt Readings from Last 3 Encounters:  05/01/21 290 lb 12.8 oz (131.9 kg)  01/30/21 286 lb 3.2 oz  (129.8 kg)  12/22/20 286 lb (129.7 kg)    EXAM:  GENERAL: alert, oriented, appears well and in no acute distress  HEENT: atraumatic, conjunctiva clear, no obvious abnormalities on inspection of external nose and ears  NECK: normal movements of the head and neck  LUNGS: on inspection no signs of respiratory distress, breathing rate appears normal, no obvious gross SOB, gasping or wheezing  CV: no obvious cyanosis  MS: moves all visible extremities without noticeable abnormality  PSYCH/NEURO: pleasant and cooperative, no obvious depression or anxiety, speech and thought processing grossly intact   Assessment/Plan  1. Controlled type 2 diabetes mellitus with complication, without long-term current use of insulin ([place]) I completed paperwork for ozempic this morning. Hopeful that she will be able to get this due to intolerance of other medications. She is still working with [facility] and is getting back on track with eating after being on vacation.   2. Depression, recurrent (HCC) Mood better on lexapro 87m daily. Continue with this.   3. COVID She did well with infection; minimal URI symptoms. Breathing is at baseline.    follow up suggested 3 months after staring ozempic. We will d/c glipizide if she gets ozempic due to leg tingling side effect.   I discussed the assessment and treatment plan with the patient. The patient was provided an opportunity to ask questions and all were answered. The patient agreed with the plan and demonstrated an understanding of the instructions.   The patient was advised to call back or seek an in-person evaluation if the symptoms worsen or if the condition fails to improve as anticipated.  I provided 20 minutes of non-face-to-face time during this encounter.   JMicheline Rough MD

## 2021-06-05 NOTE — Telephone Encounter (Signed)
Forms and office notes from 6/10 were faxed to [facility] at [phone].

## 2021-06-11 NOTE — Telephone Encounter (Signed)
Fax received from [facility] stating the request was approved from 06/02/2021-06/01/2022.  I spoke with Lindsay Sanchez at the [facility], he stated the Rx went through and the pt already picked up the Rx.  Fax sent to be scanned.

## 2021-07-16 ENCOUNTER — Telehealth: Payer: Self-pay | Admitting: Family Medicine

## 2021-07-16 NOTE — Telephone Encounter (Signed)
Patient states she has labs Monday before her appointment on Friday every time she schedules for Dr.Koberlein. Theres no request in for labs so we were wanting the OK to schedule for labs before Friday appointment on September 23rd.        Good callback number is [phone] (Mobile)       Please Advise.

## 2021-07-16 NOTE — Telephone Encounter (Signed)
Patient called because she is having side effects of belches and nausea for the last three days. The Medication is Semaglutide,0.25 or 0.5MG /DOS, (OZEMPIC, 0.25 OR 0.5 MG/DOSE,) 2 MG/1.5ML SOPN Patient states diabetic numbers are good and just wants to know if the side effects are temporary. Patient has started the increased dose per the instructions of Dr.Koberlein. Patient does have acid reflux as well, takes Nexium as needed, but is not helping this.    Good callback number [phone]    Please Advise

## 2021-07-17 ENCOUNTER — Other Ambulatory Visit: Payer: Self-pay | Admitting: Family Medicine

## 2021-07-17 NOTE — Telephone Encounter (Signed)
Most people adjust to the side effects within a couple of weeks after dose adjustments. Limiting "less healthy" foods is helpful (ie avoid greasy, fried, higher fat content) to decrease some of these side effects. If she is not adjusting we can decrease dose back to previous, but if she is managing ok, she should adjust and side effect should lessen.

## 2021-07-17 NOTE — Telephone Encounter (Signed)
It will only be 3 months from time of last bloodwork when she is here in September. We can get an in office POC A1C with immediate result but she does not need her other routine labs because it is too early.

## 2021-07-20 NOTE — Telephone Encounter (Signed)
Patient informed of the message below and states she is feeling better today.

## 2021-07-20 NOTE — Telephone Encounter (Signed)
Patient informed of the message below.

## 2021-08-14 ENCOUNTER — Ambulatory Visit: Payer: BC Managed Care – PPO | Admitting: Family Medicine

## 2021-08-14 ENCOUNTER — Other Ambulatory Visit: Payer: Self-pay

## 2021-08-14 ENCOUNTER — Encounter: Payer: Self-pay | Admitting: Family Medicine

## 2021-08-14 VITALS — BP 130/90 | HR 73 | Temp 98.0°F | Ht 67.0 in | Wt 277.6 lb

## 2021-08-14 DIAGNOSIS — I10 Essential (primary) hypertension: Secondary | ICD-10-CM | POA: Diagnosis not present

## 2021-08-14 DIAGNOSIS — E118 Type 2 diabetes mellitus with unspecified complications: Secondary | ICD-10-CM

## 2021-08-14 DIAGNOSIS — Z23 Encounter for immunization: Secondary | ICD-10-CM

## 2021-08-14 LAB — POCT GLYCOSYLATED HEMOGLOBIN (HGB A1C): Hemoglobin A1C: 6.6 % — AB (ref 4.0–5.6)

## 2021-08-14 MED ORDER — OZEMPIC (0.25 OR 0.5 MG/DOSE) 2 MG/1.5ML ~~LOC~~ SOPN: 0.5000 mg | PEN_INJECTOR | SUBCUTANEOUS | 5 refills | Status: DC

## 2021-08-14 NOTE — Patient Instructions (Signed)
Monitor bp at home and let me know if regularly at or over 135/85.

## 2021-08-14 NOTE — Progress Notes (Signed)
Lindsay Sanchez DOB: 04-16-1971 Encounter date: 08/14/2021  This is a 50 y.o. female who presents with Chief Complaint  Patient presents with   Follow-up    Needs refills for Ozempic as she is taking 0.31m currently    History of present illness:  Sugars have been doing pretty well overall. Weekly summary from [facility] - fasting average with estimated a1c 7.1. avg 153 last week (up from week prior). Still burping horribly with the ozempic 0.574m only constipated towards end of week; otherwise does well. Has used stool softener Thursday, Friday which does the trick.   She continues with the keto diet and has added in some fruit - more berries. She is doing low carb bread as well limited to 2 pieces/day.   Not exercising regularly but is staying very active. Very stressed right now. MIL who has been in nursing home went to ER and hospice has been consulted. Daughter pregnant with twins. DIL on bed rest.   HTN: bp at home 120-130/80. Taking losartan 253maily.    Allergies  Allergen Reactions   Bee Venom Anaphylaxis   Actos [Pioglitazone]     Itching everywhere   Jardiance [Empagliflozin]     Excessive urination   Metformin And Related     Diarrhea, intermittent. Leg numbness.   Other     Ragweed and Pollen   Adhesive [Tape] Rash    And itching (when left on for extended time)   Current Meds  Medication Sig   Accu-Chek FastClix Lancets MISC Use as directed   albuterol (PROVENTIL HFA;VENTOLIN HFA) 108 (90 Base) MCG/ACT inhaler Inhale 2 puffs into the lungs every 6 (six) hours as needed.   Blood Glucose Monitoring Suppl (ACCU-CHEK GUIDE ME) w/Device KIT Use as directed   cetirizine (ZYRTEC) 10 MG tablet Take 10 mg by mouth at bedtime.   escitalopram (LEXAPRO) 20 MG tablet Take 1 tablet (20 mg total) by mouth daily.   glipiZIDE (GLUCOTROL XL) 2.5 MG 24 hr tablet Take 1 tablet by mouth once daily with breakfast   glucose blood (ACCU-CHEK GUIDE) test strip Use as instructed    loratadine (CLARITIN) 10 MG tablet Take 10 mg by mouth every morning.   losartan (COZAAR) 25 MG tablet Take 1 tablet by mouth once daily   Multiple Vitamin (MULTIVITAMIN) tablet Take 1 tablet by mouth daily.   rosuvastatin (CRESTOR) 5 MG tablet Take 0.5 tablets (2.5 mg total) by mouth daily. (Patient taking differently: Take 5 mg by mouth every other day.)   Semaglutide,0.25 or 0.5MG/DOS, (OZEMPIC, 0.25 OR 0.5 MG/DOSE,) 2 MG/1.5ML SOPN Inject 0.5 mg into the skin once a week.   Spacer/Aero-Holding Chambers (AEROCHAMBER PLUS) inhaler Use as instructed   [DISCONTINUED] benzonatate (TESSALON PERLES) 100 MG capsule Take 1 capsule (100 mg total) by mouth 3 (three) times daily as needed.   [DISCONTINUED] Semaglutide,0.25 or 0.5MG/DOS, (OZEMPIC, 0.25 OR 0.5 MG/DOSE,) 2 MG/1.5ML SOPN Inject 0.25 mg into the skin once a week. (Patient taking differently: Inject 0.5 mg into the skin once a week.)    Review of Systems  Constitutional:  Negative for chills, fatigue and fever.  Respiratory:  Negative for cough, chest tightness, shortness of breath and wheezing.   Cardiovascular:  Negative for chest pain, palpitations and leg swelling.   Objective:  BP 130/90 (BP Location: Left Arm, Patient Position: Sitting, Cuff Size: Large)   Pulse 73   Temp 98 F (36.7 C) (Oral)   Ht 5' 7"  (1.702 m)   Wt 277 lb 9.6  oz (125.9 kg)   SpO2 97%   BMI 43.48 kg/m   Weight: 277 lb 9.6 oz (125.9 kg)   BP Readings from Last 3 Encounters:  08/14/21 130/90  05/01/21 138/88  01/30/21 118/80   Wt Readings from Last 3 Encounters:  08/14/21 277 lb 9.6 oz (125.9 kg)  05/01/21 290 lb 12.8 oz (131.9 kg)  01/30/21 286 lb 3.2 oz (129.8 kg)    Physical Exam Constitutional:      General: She is not in acute distress.    Appearance: She is well-developed.  HENT:     Head: Normocephalic and atraumatic.  Cardiovascular:     Rate and Rhythm: Normal rate and regular rhythm.     Heart sounds: Normal heart sounds. No murmur  heard. Pulmonary:     Effort: Pulmonary effort is normal.     Breath sounds: Normal breath sounds.  Abdominal:     General: Bowel sounds are normal. There is no distension.     Palpations: Abdomen is soft.     Tenderness: There is no abdominal tenderness. There is no guarding.  Skin:    General: Skin is warm and dry.     Comments: Sensory exam of the foot is normal, tested with the monofilament. Good pulses, no lesions or ulcers, good peripheral pulses.  Psychiatric:        Judgment: Judgment normal.    Assessment/Plan  1. Controlled type 2 diabetes mellitus with complication, without long-term current use of insulin ([place]) Continue with current medications. A1C Is 6.6 today which is great. Continue same dose ozempic.  - POC HgB A1c - HM DIABETES FOOT EXAM - Semaglutide,0.25 or 0.5MG/DOS, (OZEMPIC, 0.25 OR 0.5 MG/DOSE,) 2 MG/1.5ML SOPN; Inject 0.5 mg into the skin once a week.  Dispense: 4.5 mL; Refill: 5  2. Hypertension, unspecified type Home readings have been pretty good; she will monitor and let me know.   Return in about 3 months (around 11/13/2021) for Chronic condition visit.    Micheline Rough, MD

## 2021-08-14 NOTE — Addendum Note (Signed)
Addended by: Johnella Moloney on: 08/14/2021 02:30 PM   Modules accepted: Orders

## 2021-08-16 ENCOUNTER — Other Ambulatory Visit: Payer: Self-pay | Admitting: Family Medicine

## 2021-10-15 ENCOUNTER — Other Ambulatory Visit: Payer: Self-pay | Admitting: Family Medicine

## 2021-10-31 ENCOUNTER — Encounter: Payer: Self-pay | Admitting: Family Medicine

## 2021-10-31 DIAGNOSIS — E118 Type 2 diabetes mellitus with unspecified complications: Secondary | ICD-10-CM

## 2021-11-03 MED ORDER — SEMAGLUTIDE (1 MG/DOSE) 4 MG/3ML ~~LOC~~ SOPN: 1.0000 mg | PEN_INJECTOR | SUBCUTANEOUS | 5 refills | Status: DC

## 2021-11-09 ENCOUNTER — Other Ambulatory Visit: Payer: Self-pay | Admitting: Family Medicine

## 2021-11-09 DIAGNOSIS — E1165 Type 2 diabetes mellitus with hyperglycemia: Secondary | ICD-10-CM

## 2021-11-09 DIAGNOSIS — R030 Elevated blood-pressure reading, without diagnosis of hypertension: Secondary | ICD-10-CM

## 2021-11-26 ENCOUNTER — Telehealth: Payer: BC Managed Care – PPO | Admitting: Family Medicine

## 2021-11-26 ENCOUNTER — Encounter: Payer: Self-pay | Admitting: Family Medicine

## 2021-11-26 DIAGNOSIS — J019 Acute sinusitis, unspecified: Secondary | ICD-10-CM

## 2021-11-26 MED ORDER — AMOXICILLIN-POT CLAVULANATE 875-125 MG PO TABS: 1.0000 | ORAL_TABLET | Freq: Two times a day (BID) | ORAL | 0 refills | Status: DC

## 2021-11-26 NOTE — Progress Notes (Signed)
Subjective:    Patient ID: Lindsay Sanchez, female    DOB: 06/27/1971, 51 y.o.   MRN: [id number]  HPI Virtual Visit via Video Note  I connected with the patient on 11/26/21 at 10:15 AM EST by a video enabled telemedicine application and verified that I am speaking with the correct person using two identifiers.  Location patient: home Location provider:work or home office Persons participating in the virtual visit: patient, provider  I discussed the limitations of evaluation and management by telemedicine and the availability of in person appointments. The patient expressed understanding and agreed to proceed.   HPI: Here for 7 days of stuffy head, bilateral ear pain, PND, and a dry cough. No fever or ST. No SOB or body aches. She has tested negative for the Covid-19 virus twice in the past week. Taking Mucinex.    ROS: See pertinent positives and negatives per HPI.  Past Medical History:  Diagnosis Date   Anxiety    anxiety attacks are infrequent   Asthma    Chest tightness    Diabetes ([place])    Macular degeneration    per patient-treated by Dr Katy Fitch   Meralgia paresthetica of left side 12/12/2015   Obesity 09/27/2012   Urinary frequency     Past Surgical History:  Procedure Laterality Date   ABDOMINAL HYSTERECTOMY     precancer cells on pap smear; thinks left ovar remains   CESAREAN SECTION     FOOT SURGERY     cyst removal   HAMMERTOE RECONSTRUCTION WITH WEIL OSTEOTOMY Right 12/25/2015   Procedure: RIGHT THIRD HAMMERTOE CORRECTION, WEIL OSTEOTOMY;  Surgeon: Wylene Simmer, MD;  Location: [place];  Service: Orthopedics;  Laterality: Right;   HAND SURGERY     HARDWARE REMOVAL Right 12/25/2015   Procedure: RIGHT FIFTH METATARSAL DEEP IMPLANT REMOVAL, EXOSTECTOMY;  Surgeon: Wylene Simmer, MD;  Location: [place];  Service: Orthopedics;  Laterality: Right;   MOUTH SURGERY     [facility]--removal of cyst per patient-awaiting  path results   NECK SURGERY     cervical fusion   ROOT CANAL     TONSILLECTOMY      Family History  Problem Relation Age of Onset   Arrhythmia Mother        V TACH   Ovarian cancer Mother    Breast cancer Mother 45   Heart attack Father 33   Heart attack Maternal Grandfather    Cancer Maternal Grandfather    Heart disease Paternal Grandmother    Sudden Cardiac Death Brother    Heart attack Brother    Breast cancer Maternal Aunt    Breast cancer Maternal Aunt    Breast cancer Cousin        maternal first cousin     Current Outpatient Medications:    Accu-Chek FastClix Lancets MISC, Use as directed, Disp: 102 each, Rfl: 3   albuterol (PROVENTIL HFA;VENTOLIN HFA) 108 (90 Base) MCG/ACT inhaler, Inhale 2 puffs into the lungs every 6 (six) hours as needed., Disp: 1 Inhaler, Rfl: 0   amoxicillin-clavulanate (AUGMENTIN) 875-125 MG tablet, Take 1 tablet by mouth 2 (two) times daily., Disp: 20 tablet, Rfl: 0   Blood Glucose Monitoring Suppl (ACCU-CHEK GUIDE ME) w/Device KIT, Use as directed, Disp: 1 kit, Rfl: 0   cetirizine (ZYRTEC) 10 MG tablet, Take 10 mg by mouth at bedtime., Disp: , Rfl:    escitalopram (LEXAPRO) 20 MG tablet, Take 1 tablet by mouth once daily, Disp: 90  tablet, Rfl: 1   glipiZIDE (GLUCOTROL XL) 2.5 MG 24 hr tablet, Take 1 tablet by mouth once daily with breakfast, Disp: 90 tablet, Rfl: 0   glucose blood (ACCU-CHEK GUIDE) test strip, Use as instructed, Disp: 100 each, Rfl: 3   loratadine (CLARITIN) 10 MG tablet, Take 10 mg by mouth every morning., Disp: , Rfl:    losartan (COZAAR) 25 MG tablet, Take 1 tablet by mouth once daily, Disp: 90 tablet, Rfl: 1   Multiple Vitamin (MULTIVITAMIN) tablet, Take 1 tablet by mouth daily., Disp: , Rfl:    rosuvastatin (CRESTOR) 5 MG tablet, Take 1/2 (one-half) tablet by mouth once daily, Disp: 45 tablet, Rfl: 1   Semaglutide, 1 MG/DOSE, 4 MG/3ML SOPN, Inject 1 mg as directed once a week., Disp: 3 mL, Rfl: 5   Spacer/Aero-Holding  Chambers (AEROCHAMBER PLUS) inhaler, Use as instructed, Disp: 1 each, Rfl: 0  EXAM:  VITALS per patient if applicable:  GENERAL: alert, oriented, appears well and in no acute distress  HEENT: atraumatic, conjunttiva clear, no obvious abnormalities on inspection of external nose and ears  NECK: normal movements of the head and neck  LUNGS: on inspection no signs of respiratory distress, breathing rate appears normal, no obvious gross SOB, gasping or wheezing  CV: no obvious cyanosis  MS: moves all visible extremities without noticeable abnormality  PSYCH/NEURO: pleasant and cooperative, no obvious depression or anxiety, speech and thought processing grossly intact  ASSESSMENT AND PLAN: Sinusitis. Treat with 10 days of Augmentin. Recheck as needed.  Alysia Penna, MD  Discussed the following assessment and plan:  No diagnosis found.     I discussed the assessment and treatment plan with the patient. The patient was provided an opportunity to ask questions and all were answered. The patient agreed with the plan and demonstrated an understanding of the instructions.   The patient was advised to call back or seek an in-person evaluation if the symptoms worsen or if the condition fails to improve as anticipated.      Review of Systems     Objective:   Physical Exam        Assessment & Plan:

## 2021-11-27 ENCOUNTER — Telehealth: Payer: BC Managed Care – PPO | Admitting: Family Medicine

## 2021-12-02 ENCOUNTER — Encounter: Payer: Self-pay | Admitting: Family Medicine

## 2021-12-11 ENCOUNTER — Other Ambulatory Visit: Payer: Self-pay | Admitting: Family Medicine

## 2021-12-11 DIAGNOSIS — Z1231 Encounter for screening mammogram for malignant neoplasm of breast: Secondary | ICD-10-CM

## 2021-12-12 DIAGNOSIS — E118 Type 2 diabetes mellitus with unspecified complications: Secondary | ICD-10-CM | POA: Diagnosis not present

## 2021-12-14 ENCOUNTER — Ambulatory Visit: Payer: BC Managed Care – PPO | Admitting: Family Medicine

## 2021-12-14 VITALS — BP 122/84 | HR 78 | Temp 98.0°F | Ht 67.0 in | Wt 287.9 lb

## 2021-12-14 DIAGNOSIS — F339 Major depressive disorder, recurrent, unspecified: Secondary | ICD-10-CM | POA: Diagnosis not present

## 2021-12-14 DIAGNOSIS — E1169 Type 2 diabetes mellitus with other specified complication: Secondary | ICD-10-CM

## 2021-12-14 DIAGNOSIS — E785 Hyperlipidemia, unspecified: Secondary | ICD-10-CM

## 2021-12-14 DIAGNOSIS — J4520 Mild intermittent asthma, uncomplicated: Secondary | ICD-10-CM | POA: Diagnosis not present

## 2021-12-14 DIAGNOSIS — E118 Type 2 diabetes mellitus with unspecified complications: Secondary | ICD-10-CM

## 2021-12-14 DIAGNOSIS — I10 Essential (primary) hypertension: Secondary | ICD-10-CM

## 2021-12-14 LAB — MICROALBUMIN / CREATININE URINE RATIO
Creatinine,U: 113.3 mg/dL
Microalb Creat Ratio: 0.6 mg/g (ref 0.0–30.0)
Microalb, Ur: <0.7 mg/dL (ref 0.0–1.9)

## 2021-12-14 LAB — CBC WITH DIFFERENTIAL/PLATELET
Basophils Absolute: 0 10*3/uL (ref 0.0–0.1)
Basophils Relative: 0.5 % (ref 0.0–3.0)
Eosinophils Absolute: 0.1 10*3/uL (ref 0.0–0.7)
Eosinophils Relative: 1.2 % (ref 0.0–5.0)
HCT: 41 % (ref 36.0–46.0)
Hemoglobin: 13.5 g/dL (ref 12.0–15.0)
Lymphocytes Relative: 19.8 % (ref 12.0–46.0)
Lymphs Abs: 1.5 10*3/uL (ref 0.7–4.0)
MCHC: 32.9 g/dL (ref 30.0–36.0)
MCV: 87.4 fl (ref 78.0–100.0)
Monocytes Absolute: 0.5 10*3/uL (ref 0.1–1.0)
Monocytes Relative: 6.9 % (ref 3.0–12.0)
Neutro Abs: 5.5 10*3/uL (ref 1.4–7.7)
Neutrophils Relative %: 71.6 % (ref 43.0–77.0)
Platelets: 204 10*3/uL (ref 150.0–400.0)
RBC: 4.7 Mil/uL (ref 3.87–5.11)
RDW: 14.9 % (ref 11.5–15.5)
WBC: 7.7 10*3/uL (ref 4.0–10.5)

## 2021-12-14 LAB — LIPID PANEL
Cholesterol: 180 mg/dL (ref 0–200)
HDL: 78 mg/dL (ref >39.00)
LDL Cholesterol: 83 mg/dL (ref 0–99)
NonHDL: 102.05
Total CHOL/HDL Ratio: 2
Triglycerides: 93 mg/dL (ref 0.0–149.0)
VLDL: 18.6 mg/dL (ref 0.0–40.0)

## 2021-12-14 LAB — COMPREHENSIVE METABOLIC PANEL
ALT: 22 U/L (ref 0–35)
AST: 24 U/L (ref 0–37)
Albumin: 4.5 g/dL (ref 3.5–5.2)
Alkaline Phosphatase: 67 U/L (ref 39–117)
BUN: 17 mg/dL (ref 6–23)
CO2: 30 mEq/L (ref 19–32)
Calcium: 9.4 mg/dL (ref 8.4–10.5)
Chloride: 102 mEq/L (ref 96–112)
Creatinine, Ser: 0.71 mg/dL (ref 0.40–1.20)
GFR: 97.39 mL/min (ref >60.00)
Glucose, Bld: 118 mg/dL — ABNORMAL HIGH (ref 70–99)
Potassium: 4.8 mEq/L (ref 3.5–5.1)
Sodium: 139 mEq/L (ref 135–145)
Total Bilirubin: 0.4 mg/dL (ref 0.2–1.2)
Total Protein: 7.4 g/dL (ref 6.0–8.3)

## 2021-12-14 LAB — HEMOGLOBIN A1C: Hgb A1c MFr Bld: 6.8 % — ABNORMAL HIGH (ref 4.6–6.5)

## 2021-12-14 MED ORDER — TIRZEPATIDE 5 MG/0.5ML ~~LOC~~ SOAJ: 5.0000 mg | SUBCUTANEOUS | 0 refills | Status: DC

## 2021-12-14 MED ORDER — TIRZEPATIDE 2.5 MG/0.5ML ~~LOC~~ SOAJ: 2.5000 mg | SUBCUTANEOUS | 0 refills | Status: DC

## 2021-12-14 MED ORDER — TIRZEPATIDE 7.5 MG/0.5ML ~~LOC~~ SOAJ: 7.5000 mg | SUBCUTANEOUS | 0 refills | Status: DC

## 2021-12-14 NOTE — Progress Notes (Signed)
Lindsay Sanchez DOB: 1971-05-02 Encounter date: 12/14/2021  This is a 51 y.o. female who presents with Chief Complaint  Patient presents with   Follow-up    History of present illness: Patient currently on Ozempic.  Wondering if she should change to [facility] for better weight loss effects. She is still having some craving with eating. Has also been very hard with lots of loss (death of daughters twins, father in law, friend's mother).   Last A1c 9/23 was 6.6.  This was down from 7.1 prior and 8-10 range prior to that.  She has been intolerant to metformin, Jardiance, Actos. She has gained weight on the ozempic 81m dose. She has 2 weeks left of this.   Allergies  Allergen Reactions   Bee Venom Anaphylaxis   Actos [Pioglitazone]     Itching everywhere   Jardiance [Empagliflozin]     Excessive urination   Metformin And Related     Diarrhea, intermittent. Leg numbness.   Other     Ragweed and Pollen   Adhesive [Tape] Rash    And itching (when left on for extended time)   Current Meds  Medication Sig   Accu-Chek FastClix Lancets MISC Use as directed   albuterol (PROVENTIL HFA;VENTOLIN HFA) 108 (90 Base) MCG/ACT inhaler Inhale 2 puffs into the lungs every 6 (six) hours as needed.   Blood Glucose Monitoring Suppl (ACCU-CHEK GUIDE ME) w/Device KIT Use as directed   cetirizine (ZYRTEC) 10 MG tablet Take 10 mg by mouth at bedtime.   escitalopram (LEXAPRO) 20 MG tablet Take 1 tablet by mouth once daily   glucose blood (ACCU-CHEK GUIDE) test strip Use as instructed   loratadine (CLARITIN) 10 MG tablet Take 10 mg by mouth every morning.   losartan (COZAAR) 25 MG tablet Take 1 tablet by mouth once daily   Multiple Vitamin (MULTIVITAMIN) tablet Take 1 tablet by mouth daily.   rosuvastatin (CRESTOR) 5 MG tablet Take 1/2 (one-half) tablet by mouth once daily   Semaglutide, 1 MG/DOSE, 4 MG/3ML SOPN Inject 1 mg as directed once a week.   Spacer/Aero-Holding Chambers (AEROCHAMBER PLUS)  inhaler Use as instructed    Review of Systems  Constitutional:  Negative for chills, fatigue and fever.  Respiratory:  Negative for cough, chest tightness, shortness of breath and wheezing.   Cardiovascular:  Negative for chest pain, palpitations and leg swelling.   Objective:  BP 122/84 (BP Location: Left Arm, Patient Position: Sitting, Cuff Size: Large)    Pulse 78    Temp 98 F (36.7 C) (Oral)    Ht 5' 7" (1.702 m)    Wt 287 lb 14.4 oz (130.6 kg)    SpO2 97%    BMI 45.09 kg/m   Weight: 287 lb 14.4 oz (130.6 kg)   BP Readings from Last 3 Encounters:  12/14/21 122/84  08/14/21 130/90  05/01/21 138/88   Wt Readings from Last 3 Encounters:  12/14/21 287 lb 14.4 oz (130.6 kg)  08/14/21 277 lb 9.6 oz (125.9 kg)  05/01/21 290 lb 12.8 oz (131.9 kg)    Physical Exam Constitutional:      General: She is not in acute distress.    Appearance: She is well-developed.  HENT:     Head: Normocephalic and atraumatic.  Cardiovascular:     Rate and Rhythm: Normal rate and regular rhythm.     Heart sounds: Normal heart sounds. No murmur heard. Pulmonary:     Effort: Pulmonary effort is normal.     Breath  sounds: Normal breath sounds.  Abdominal:     General: Bowel sounds are normal. There is no distension.     Palpations: Abdomen is soft.     Tenderness: There is no abdominal tenderness. There is no guarding.  Feet:     Comments: Normal bilateral monofilament foot exam.  Mild heel callus.  Skin is intact. Skin:    General: Skin is warm and dry.     Comments: Sensory exam of the foot is normal, tested with the monofilament. Good pulses, no lesions or ulcers, good peripheral pulses.  Psychiatric:        Judgment: Judgment normal.    Assessment/Plan  1. Controlled type 2 diabetes mellitus with complication, without long-term current use of insulin ([place]) Patient would like to change to [facility] for help with additional weight loss. Will send rx and make sure covered by insurance. If  not covered, we can continue to titrate up on ozempic.  - CBC with Differential/Platelet; Future - Hemoglobin A1c; Future - Microalbumin / creatinine urine ratio; Future - HM DIABETES FOOT EXAM - Microalbumin / creatinine urine ratio - Hemoglobin A1c - CBC with Differential/Platelet  2. Morbid obesity ([place]) See above. She is working on regular activity, continuing with healthy eating.   3. Mild intermittent asthma without complication Breathing has ben stable.   4. Depression, recurrent ([place]) Mood stable with lexapro,but has had very difficult few months.   5. Hypertension, unspecified type Well controlled. Continue with losartan for renal protection.   6. Hyperlipidemia associated with type 2 diabetes mellitus ([place]) Continue with crestor; recheck bloodwork today. - Comprehensive metabolic panel; Future - Lipid panel; Future - Lipid panel - Comprehensive metabolic panel   Return in about 6 months (around 06/13/2022) for Chronic condition visit.     Micheline Rough, MD

## 2021-12-16 ENCOUNTER — Telehealth: Payer: Self-pay | Admitting: Family Medicine

## 2021-12-16 NOTE — Telephone Encounter (Signed)
Spoke with the patient and informed her of the information below.

## 2021-12-16 NOTE — Telephone Encounter (Signed)
Prior auth for [facility] 5mg  sent to [email or web address]-Key: pending insurance review.

## 2021-12-16 NOTE — Telephone Encounter (Signed)
Patient stated that her insurance denied her tirzepatide [facility]) 5 MG/0.5ML Pen [[id number]] again. She stated that they did this before and Dr.Koberlein or Ronnald Collum submitted something to the insurance carrier to get the medication approved.  Patient could be contacted at [phone].  Please advise.

## 2021-12-18 ENCOUNTER — Other Ambulatory Visit: Payer: Self-pay | Admitting: Family Medicine

## 2021-12-18 MED ORDER — SEMAGLUTIDE (2 MG/DOSE) 8 MG/3ML ~~LOC~~ SOPN: 2.0000 mg | PEN_INJECTOR | SUBCUTANEOUS | 5 refills | Status: DC

## 2022-01-04 ENCOUNTER — Encounter: Payer: Self-pay | Admitting: Family Medicine

## 2022-01-12 DIAGNOSIS — E118 Type 2 diabetes mellitus with unspecified complications: Secondary | ICD-10-CM | POA: Diagnosis not present

## 2022-01-18 ENCOUNTER — Ambulatory Visit
Admission: RE | Admit: 2022-01-18 | Discharge: 2022-01-18 | Disposition: A | Discharge status: Home / Self Care | Payer: BC Managed Care – PPO | Source: Ambulatory Visit | Attending: Family Medicine | Admitting: Family Medicine

## 2022-01-18 ENCOUNTER — Ambulatory Visit: Payer: BC Managed Care – PPO

## 2022-01-18 ENCOUNTER — Other Ambulatory Visit: Payer: Self-pay

## 2022-01-18 DIAGNOSIS — Z1231 Encounter for screening mammogram for malignant neoplasm of breast: Secondary | ICD-10-CM | POA: Diagnosis not present

## 2022-02-09 ENCOUNTER — Telehealth: Payer: Self-pay | Admitting: Family Medicine

## 2022-02-09 DIAGNOSIS — E118 Type 2 diabetes mellitus with unspecified complications: Secondary | ICD-10-CM | POA: Diagnosis not present

## 2022-02-09 MED ORDER — ROSUVASTATIN CALCIUM 5 MG PO TABS: 5.0000 mg | ORAL_TABLET | Freq: Every day | ORAL | 1 refills | Status: DC

## 2022-02-09 NOTE — Telephone Encounter (Signed)
Rx done. 

## 2022-02-09 NOTE — Telephone Encounter (Signed)
Pt is calling and she needs new rx rosuvastatin (CRESTOR) 5 MG tablet . Pt was taking 1/2 of tablet. ?[facility] [address], [place] - [place] [address] Phone:  [phone]  ?Fax:  [phone]  ?  ? ?

## 2022-03-01 DIAGNOSIS — H353131 Nonexudative age-related macular degeneration, bilateral, early dry stage: Secondary | ICD-10-CM | POA: Diagnosis not present

## 2022-03-01 DIAGNOSIS — H04123 Dry eye syndrome of bilateral lacrimal glands: Secondary | ICD-10-CM | POA: Diagnosis not present

## 2022-03-01 DIAGNOSIS — N951 Menopausal and female climacteric states: Secondary | ICD-10-CM | POA: Diagnosis not present

## 2022-03-01 DIAGNOSIS — Z6841 Body Mass Index (BMI) 40.0 and over, adult: Secondary | ICD-10-CM | POA: Diagnosis not present

## 2022-03-01 DIAGNOSIS — Z01419 Encounter for gynecological examination (general) (routine) without abnormal findings: Secondary | ICD-10-CM | POA: Diagnosis not present

## 2022-03-01 DIAGNOSIS — H1045 Other chronic allergic conjunctivitis: Secondary | ICD-10-CM | POA: Diagnosis not present

## 2022-03-01 DIAGNOSIS — E119 Type 2 diabetes mellitus without complications: Secondary | ICD-10-CM | POA: Diagnosis not present

## 2022-03-22 ENCOUNTER — Ambulatory Visit (INDEPENDENT_AMBULATORY_CARE_PROVIDER_SITE_OTHER): Payer: BC Managed Care – PPO | Admitting: Family Medicine

## 2022-03-22 ENCOUNTER — Encounter: Payer: Self-pay | Admitting: Family Medicine

## 2022-03-22 VITALS — BP 148/90 | HR 75 | Temp 98.5°F | Ht 67.0 in | Wt 282.1 lb

## 2022-03-22 DIAGNOSIS — E785 Hyperlipidemia, unspecified: Secondary | ICD-10-CM

## 2022-03-22 DIAGNOSIS — E1169 Type 2 diabetes mellitus with other specified complication: Secondary | ICD-10-CM

## 2022-03-22 DIAGNOSIS — Z6841 Body Mass Index (BMI) 40.0 and over, adult: Secondary | ICD-10-CM

## 2022-03-22 DIAGNOSIS — E118 Type 2 diabetes mellitus with unspecified complications: Secondary | ICD-10-CM

## 2022-03-22 DIAGNOSIS — Z23 Encounter for immunization: Secondary | ICD-10-CM

## 2022-03-22 LAB — LIPID PANEL
Cholesterol: 156 mg/dL (ref 0–200)
HDL: 78.6 mg/dL (ref >39.00)
LDL Cholesterol: 61 mg/dL (ref 0–99)
NonHDL: 77.36
Total CHOL/HDL Ratio: 2
Triglycerides: 81 mg/dL (ref 0.0–149.0)
VLDL: 16.2 mg/dL (ref 0.0–40.0)

## 2022-03-22 LAB — HEMOGLOBIN A1C: Hgb A1c MFr Bld: 6.4 % (ref 4.6–6.5)

## 2022-03-22 MED ORDER — TIRZEPATIDE 2.5 MG/0.5ML ~~LOC~~ SOAJ: 2.5000 mg | SUBCUTANEOUS | 2 refills | Status: DC

## 2022-03-22 NOTE — Patient Instructions (Signed)
Theodora Blow - naturopath ? ? ?

## 2022-03-22 NOTE — Addendum Note (Signed)
Addended by: Johnella Moloney on: 03/22/2022 12:27 PM ? ? Modules accepted: Orders ? ?

## 2022-03-22 NOTE — Progress Notes (Signed)
?Lindsay Sanchez ?DOB: 09/01/1971 ?Encounter date: 03/22/2022 ? ?This is a 51 y.o. female who presents with ?Chief Complaint  ?Patient presents with  ? Follow-up  ? Referral  ?  Patient states she looked at her medication list and requested a referral to a nutritionist to see if her levels are normal/additional needs  ? ? ?History of present illness: ?Started with hot flashes, irregular sleep end of last year, but worse in last month. Gyn started her on estrogen which is helping a little bit. She is doing supplements and interested in meeting with nutrition that can help her sort out what to take with diet.  ? ?Garden of life multiple vitamin ?B12 complex with all B vitamins ?AREDs ?Vitamin A ?Alpha-lipoic acid ?Vitamin K ?Ca, mg, vit D ?Vit c,elderberry,vitamin D ?PQQ ? ?Sugars seem to increase in evening, morning. Ranging 112-164. Taking ozempic weekly 39m. She notes that she gains weight with any carbs.just feels like she is very sensitive to any sort of carb.will eat occasional sugar free bread. One sugar free cookie daily. This helps her feel less deprived/depressed with her restrictions. Does her once a week mexican/margaritas.  ? ?Didn't take medications this morning. Usually does the losartan in the morning and doesn't check regularly since it always has been stable.  ? ?Up and walking, moving daily. Walks dogs, walking to get clients.  ? ?Has seen eye doctor. ? ?Allergies  ?Allergen Reactions  ? Bee Venom Anaphylaxis  ? Actos [Pioglitazone]   ?  Itching everywhere  ? Jardiance [Empagliflozin]   ?  Excessive urination  ? Metformin And Related   ?  Diarrhea, intermittent. Leg numbness.  ? Other   ?  Ragweed and Pollen  ? Adhesive [Tape] Rash  ?  And itching (when left on for extended time)  ? ?Current Meds  ?Medication Sig  ? Accu-Chek FastClix Lancets MISC Use as directed  ? albuterol (PROVENTIL HFA;VENTOLIN HFA) 108 (90 Base) MCG/ACT inhaler Inhale 2 puffs into the lungs every 6 (six) hours as needed.  ?  Blood Glucose Monitoring Suppl (ACCU-CHEK GUIDE ME) w/Device KIT Use as directed  ? cetirizine (ZYRTEC) 10 MG tablet Take 10 mg by mouth at bedtime.  ? escitalopram (LEXAPRO) 20 MG tablet Take 1 tablet by mouth once daily  ? estradiol (ESTRACE) 2 MG tablet Take 2 mg by mouth daily.  ? glucose blood (ACCU-CHEK GUIDE) test strip Use as instructed  ? loratadine (CLARITIN) 10 MG tablet Take 10 mg by mouth every morning.  ? losartan (COZAAR) 25 MG tablet Take 1 tablet by mouth once daily  ? Multiple Vitamin (MULTIVITAMIN) tablet Take 1 tablet by mouth daily.  ? rosuvastatin (CRESTOR) 5 MG tablet Take 1 tablet (5 mg total) by mouth daily.  ? Spacer/Aero-Holding Chambers (AEROCHAMBER PLUS) inhaler Use as instructed  ? tirzepatide (MOUNJARO) 2.5 MG/0.5ML Pen Inject 2.5 mg into the skin once a week.  ? [DISCONTINUED] Semaglutide, 2 MG/DOSE, 8 MG/3ML SOPN Inject 2 mg as directed once a week.  ? ? ?Review of Systems  ?Constitutional:  Negative for chills, fatigue and fever.  ?Respiratory:  Negative for cough, chest tightness, shortness of breath and wheezing.   ?Cardiovascular:  Negative for chest pain, palpitations and leg swelling.  ? ?Objective: ? ?BP (!) 148/90 (BP Location: Left Arm, Patient Position: Sitting, Cuff Size: Large)   Pulse 75   Temp 98.5 ?F (36.9 ?C) (Oral)   Ht '5\' 7"'  (1.702 m)   Wt 282 lb 1.6 oz (128 kg)  SpO2 99%   BMI 44.18 kg/m?   Weight: 282 lb 1.6 oz (128 kg)  ? ?BP Readings from Last 3 Encounters:  ?03/22/22 (!) 148/90  ?12/14/21 122/84  ?08/14/21 130/90  ? ?Wt Readings from Last 3 Encounters:  ?03/22/22 282 lb 1.6 oz (128 kg)  ?12/14/21 287 lb 14.4 oz (130.6 kg)  ?08/14/21 277 lb 9.6 oz (125.9 kg)  ? ? ?Physical Exam ?Constitutional:   ?   General: She is not in acute distress. ?   Appearance: She is well-developed.  ?HENT:  ?   Head: Normocephalic and atraumatic.  ?Cardiovascular:  ?   Rate and Rhythm: Normal rate and regular rhythm.  ?   Heart sounds: Normal heart sounds. No murmur  heard. ?Pulmonary:  ?   Effort: Pulmonary effort is normal.  ?   Breath sounds: Normal breath sounds.  ?Abdominal:  ?   General: Bowel sounds are normal. There is no distension.  ?   Palpations: Abdomen is soft.  ?   Tenderness: There is no abdominal tenderness. There is no guarding.  ?Feet:  ?   Comments: Normal bilateral monofilament foot exam.  Mild heel callus.  Skin is intact. ?Skin: ?   General: Skin is warm and dry.  ?   Comments: Sensory exam of the foot is normal, tested with the monofilament. Good pulses, no lesions or ulcers, good peripheral pulses.  ?Psychiatric:     ?   Judgment: Judgment normal.  ? ? ?Assessment/Plan ? ?1. Controlled type 2 diabetes mellitus with complication, without long-term current use of insulin ([place]) ?We will get A1C today. She has been doing better with sugars. We would like to change to mounjaro to help with sugar and weight loss. She lost from 320 to 280, but has stopped with weight loss. Now that mounjaro has indication for both diabetes and weight loss, we will try again to see if insurance will approve. Previously they stated she would have to try every GLP1 before they would approve mounjaro. ?- tirzepatide [facility]) 2.5 MG/0.5ML Pen; Inject 2.5 mg into the skin once a week.  Dispense: 2 mL; Refill: 2 ?- HM DIABETES FOOT EXAM ?- Hemoglobin A1c; Future ? ?2. Class 3 severe obesity due to excess calories with serious comorbidity and body mass index (BMI) of 40.0 to 44.9 in adult [facility]) ?See above. Keep working on activity level and low carb eating.  ?- tirzepatide [facility]) 2.5 MG/0.5ML Pen; Inject 2.5 mg into the skin once a week.  Dispense: 2 mL; Refill: 2 ? ?3. Hyperlipidemia associated with type 2 diabetes mellitus ([place]) ?She has increased crestor to daily 60m since last bloodwork.  ?- Lipid panel; Future ? ?Return for pending lab or imaging results. ? ? ? ? ?JMicheline Rough MD ?

## 2022-04-02 ENCOUNTER — Encounter: Payer: Self-pay | Admitting: Family Medicine

## 2022-04-04 ENCOUNTER — Other Ambulatory Visit: Payer: Self-pay | Admitting: Family Medicine

## 2022-04-04 MED ORDER — TIRZEPATIDE 5 MG/0.5ML ~~LOC~~ SOAJ: 5.0000 mg | SUBCUTANEOUS | 2 refills | Status: DC

## 2022-04-20 ENCOUNTER — Encounter: Payer: Self-pay | Admitting: Family Medicine

## 2022-04-20 MED ORDER — TIRZEPATIDE 7.5 MG/0.5ML ~~LOC~~ SOAJ: 7.5000 mg | SUBCUTANEOUS | 0 refills | Status: DC

## 2022-04-22 ENCOUNTER — Telehealth (INDEPENDENT_AMBULATORY_CARE_PROVIDER_SITE_OTHER): Payer: Self-pay | Admitting: Family Medicine

## 2022-04-22 ENCOUNTER — Encounter: Payer: Self-pay | Admitting: Family Medicine

## 2022-04-22 VITALS — BP 128/84 | HR 89 | Temp 97.3°F

## 2022-04-22 DIAGNOSIS — R0981 Nasal congestion: Secondary | ICD-10-CM

## 2022-04-22 DIAGNOSIS — R519 Headache, unspecified: Secondary | ICD-10-CM

## 2022-04-22 DIAGNOSIS — R059 Cough, unspecified: Secondary | ICD-10-CM

## 2022-04-22 MED ORDER — AMOXICILLIN-POT CLAVULANATE 875-125 MG PO TABS: 1.0000 | ORAL_TABLET | Freq: Two times a day (BID) | ORAL | 0 refills | Status: DC

## 2022-04-22 NOTE — Progress Notes (Signed)
Virtual Visit via Video Note  I connected with Lindsay Sanchez  on 04/22/22 at  3:00 PM EDT by a video enabled telemedicine application and verified that I am speaking with the correct person using two identifiers.  Location patient: [place] Location provider:work or home office Persons participating in the virtual visit: patient, provider  I discussed the limitations and requested verbal permission for telemedicine visit. The patient expressed understanding and agreed to proceed.   HPI:  Acute telemedicine visit for a resp illness: -Onset: has had some congestion for a few weeks, but worse the last 5 days ago, but worsening -Symptoms include: nasal congestion, cough, ears feel full, sore throat, feels like some of it is in the chest - occ mild SOB, thinks has a rattle in her chest when she coughs, she also has developed thick mucus and sinus pain -used her Symbicort and it resolved the SOB -reports gets this several times per year, feels has a sinus infection, reports PCP gives her Augmentin which works the best for her -Denies:fever, body aches, vomiting, diarrhea -grand son has been sick and son and law and daughter -has done covid testing twice - both negative -Has tried:musinex, allergy pill, nasonex -Pertinent past medical history: see below -Pertinent medication allergies: Allergies  Allergen Reactions   Bee Venom Anaphylaxis   Actos [Pioglitazone]     Itching everywhere   Jardiance [Empagliflozin]     Excessive urination   Metformin And Related     Diarrhea, intermittent. Leg numbness.   Other     Ragweed and Pollen   Adhesive [Tape] Rash    And itching (when left on for extended time)  -COVID-19 vaccine status:  Immunization History  Administered Date(s) Administered   Influenza,inj,Quad PF,6+ Mos 08/25/2018, 08/08/2020, 08/14/2021   PFIZER(Purple Top)SARS-COV-2 Vaccination 04/25/2020, 05/22/2020, 11/30/2020, 11/30/2020   Pneumococcal Polysaccharide-23 07/20/2018   Td  07/20/2018   Zoster Recombinat (Shingrix) 03/22/2022     ROS: See pertinent positives and negatives per HPI.  Past Medical History:  Diagnosis Date   Anxiety    anxiety attacks are infrequent   Asthma    Chest tightness    Diabetes ([place])    Macular degeneration    per patient-treated by Dr Katy Fitch   Meralgia paresthetica of left side 12/12/2015   Obesity 09/27/2012   Urinary frequency     Past Surgical History:  Procedure Laterality Date   ABDOMINAL HYSTERECTOMY     precancer cells on pap smear; thinks left ovar remains   CESAREAN SECTION     FOOT SURGERY     cyst removal   HAMMERTOE RECONSTRUCTION WITH WEIL OSTEOTOMY Right 12/25/2015   Procedure: RIGHT THIRD HAMMERTOE CORRECTION, WEIL OSTEOTOMY;  Surgeon: Wylene Simmer, MD;  Location: [place];  Service: Orthopedics;  Laterality: Right;   HAND SURGERY     HARDWARE REMOVAL Right 12/25/2015   Procedure: RIGHT FIFTH METATARSAL DEEP IMPLANT REMOVAL, EXOSTECTOMY;  Surgeon: Wylene Simmer, MD;  Location: [place];  Service: Orthopedics;  Laterality: Right;   MOUTH SURGERY     [facility]--removal of cyst per patient-awaiting path results   NECK SURGERY     cervical fusion   ROOT CANAL     TONSILLECTOMY       Current Outpatient Medications:    Accu-Chek FastClix Lancets MISC, Use as directed, Disp: 102 each, Rfl: 3   albuterol (PROVENTIL HFA;VENTOLIN HFA) 108 (90 Base) MCG/ACT inhaler, Inhale 2 puffs into the lungs every 6 (six) hours as needed., Disp: 1  Inhaler, Rfl: 0   amoxicillin-clavulanate (AUGMENTIN) 875-125 MG tablet, Take 1 tablet by mouth 2 (two) times daily., Disp: 20 tablet, Rfl: 0   Blood Glucose Monitoring Suppl (ACCU-CHEK GUIDE ME) w/Device KIT, Use as directed, Disp: 1 kit, Rfl: 0   cetirizine (ZYRTEC) 10 MG tablet, Take 10 mg by mouth at bedtime., Disp: , Rfl:    escitalopram (LEXAPRO) 20 MG tablet, Take 1 tablet by mouth once daily, Disp: 90 tablet, Rfl: 1   estradiol  (ESTRACE) 2 MG tablet, Take 2 mg by mouth daily., Disp: , Rfl:    glucose blood (ACCU-CHEK GUIDE) test strip, Use as instructed, Disp: 100 each, Rfl: 3   loratadine (CLARITIN) 10 MG tablet, Take 10 mg by mouth every morning., Disp: , Rfl:    losartan (COZAAR) 25 MG tablet, Take 1 tablet by mouth once daily, Disp: 90 tablet, Rfl: 1   Multiple Vitamin (MULTIVITAMIN) tablet, Take 1 tablet by mouth daily., Disp: , Rfl:    rosuvastatin (CRESTOR) 5 MG tablet, Take 1 tablet (5 mg total) by mouth daily., Disp: 90 tablet, Rfl: 1   Spacer/Aero-Holding Chambers (AEROCHAMBER PLUS) inhaler, Use as instructed, Disp: 1 each, Rfl: 0   tirzepatide (MOUNJARO) 5 MG/0.5ML Pen, Inject 5 mg into the skin once a week., Disp: 2 mL, Rfl: 2   tirzepatide (MOUNJARO) 7.5 MG/0.5ML Pen, Inject 7.5 mg into the skin once a week., Disp: 2 mL, Rfl: 0  EXAM:  VITALS per patient if applicable:  GENERAL: alert, oriented, appears well and in no acute distress  HEENT: atraumatic, conjunttiva clear, no obvious abnormalities on inspection of external nose and ears  NECK: normal movements of the head and neck  LUNGS: on inspection no signs of respiratory distress, breathing rate appears normal, no obvious gross SOB, gasping or wheezing  CV: no obvious cyanosis  MS: moves all visible extremities without noticeable abnormality  PSYCH/NEURO: pleasant and cooperative, no obvious depression or anxiety, speech and thought processing grossly intact  ASSESSMENT AND PLAN:  Discussed the following assessment and plan:  Nasal congestion  Facial discomfort  Cough, unspecified type  -we discussed possible serious and likely etiologies, options for evaluation and workup, limitations of telemedicine visit vs in person visit, treatment, treatment risks and precautions. Pt is agreeable to treatment via telemedicine at this moment. Query developing 2ndary bacterial resp infection given duration and worsening of symptoms vs other. She  prefers to try empiric Augmentin and continue other home measures.  Advise to seek prompt virtual visit or in person care if worsening, new symptoms arise, or if is not improving with treatment as expected per our conversation of expected course.  I discussed the assessment and treatment plan with the patient. The patient was provided an opportunity to ask questions and all were answered. The patient agreed with the plan and demonstrated an understanding of the instructions.     Lucretia Kern, DO

## 2022-04-22 NOTE — Patient Instructions (Signed)
-  I sent the medication(s) we discussed to your pharmacy: Meds ordered this encounter  Medications   amoxicillin-clavulanate (AUGMENTIN) 875-125 MG tablet    Sig: Take 1 tablet by mouth 2 (two) times daily.    Dispense:  20 tablet    Refill:  0   Use your inhalers per instructions.   I hope you are feeling better soon!  Seek in person care promptly if your symptoms worsen, new concerns arise or you are not improving with treatment.  It was nice to meet you today. I help [facility] out with telemedicine visits on Tuesdays and Thursdays and am happy to help if you need a virtual follow up visit on those days. Otherwise, if you have any concerns or questions following this visit please schedule a follow up visit with your Primary Care office or seek care at a local urgent care clinic to avoid delays in care. If you are having severe or life threatening symptoms please call 911 and/or go to the nearest emergency room.

## 2022-04-23 ENCOUNTER — Ambulatory Visit: Payer: Self-pay | Admitting: Dietician

## 2022-05-04 ENCOUNTER — Other Ambulatory Visit: Payer: Self-pay | Admitting: *Deleted

## 2022-05-04 MED ORDER — ESCITALOPRAM OXALATE 20 MG PO TABS
20.0000 mg | ORAL_TABLET | Freq: Every day | ORAL | 1 refills | Status: DC
Start: 2022-05-04 — End: 2022-10-01

## 2022-05-19 ENCOUNTER — Other Ambulatory Visit: Payer: Self-pay | Admitting: Family

## 2022-05-19 ENCOUNTER — Encounter: Payer: Self-pay | Admitting: Family Medicine

## 2022-05-19 MED ORDER — TIRZEPATIDE 10 MG/0.5ML ~~LOC~~ SOAJ: 10.0000 mg | SUBCUTANEOUS | 1 refills | Status: DC

## 2022-05-26 ENCOUNTER — Telehealth: Payer: Self-pay | Admitting: Family Medicine

## 2022-05-26 NOTE — Telephone Encounter (Signed)
Pt calling regarding her  tirzepatide [facility]) 10 MG/0.5ML Pen requesting it be written for a different quantity

## 2022-05-27 NOTE — Telephone Encounter (Signed)
Spoke with the patient for more information.  Patient stated initially the pharmacy told her the 10mg  was on back order until the end of July and she wanted to know if she could be given an Rx to take two-5mg s instead.  Patient stated the pharmacy received the stock of 10mg  sooner and nothing further is needed at this point.

## 2022-05-31 ENCOUNTER — Other Ambulatory Visit: Payer: Self-pay | Admitting: *Deleted

## 2022-05-31 DIAGNOSIS — R030 Elevated blood-pressure reading, without diagnosis of hypertension: Secondary | ICD-10-CM

## 2022-05-31 DIAGNOSIS — E1165 Type 2 diabetes mellitus with hyperglycemia: Secondary | ICD-10-CM

## 2022-06-01 MED ORDER — LOSARTAN POTASSIUM 25 MG PO TABS: 25.0000 mg | ORAL_TABLET | Freq: Every day | ORAL | 1 refills | Status: DC

## 2022-06-18 ENCOUNTER — Encounter: Payer: Self-pay | Admitting: Family Medicine

## 2022-06-18 ENCOUNTER — Telehealth: Payer: Self-pay | Admitting: Family Medicine

## 2022-06-18 ENCOUNTER — Ambulatory Visit: Payer: BC Managed Care – PPO | Admitting: Family Medicine

## 2022-06-18 VITALS — BP 104/60 | HR 75 | Temp 97.9°F | Ht 67.0 in | Wt 275.7 lb

## 2022-06-18 DIAGNOSIS — Z23 Encounter for immunization: Secondary | ICD-10-CM

## 2022-06-18 DIAGNOSIS — E118 Type 2 diabetes mellitus with unspecified complications: Secondary | ICD-10-CM

## 2022-06-18 DIAGNOSIS — J3081 Allergic rhinitis due to animal (cat) (dog) hair and dander: Secondary | ICD-10-CM

## 2022-06-18 DIAGNOSIS — J309 Allergic rhinitis, unspecified: Secondary | ICD-10-CM | POA: Insufficient documentation

## 2022-06-18 LAB — POCT GLYCOSYLATED HEMOGLOBIN (HGB A1C): Hemoglobin A1C: 6.4 % — AB (ref 4.0–5.6)

## 2022-06-18 MED ORDER — ACCU-CHEK FASTCLIX LANCETS MISC: 3 refills | Status: AC

## 2022-06-18 MED ORDER — MONTELUKAST SODIUM 10 MG PO TABS: 10.0000 mg | ORAL_TABLET | Freq: Every day | ORAL | 1 refills | Status: DC

## 2022-06-18 MED ORDER — ACCU-CHEK GUIDE VI STRP: ORAL_STRIP | 3 refills | Status: DC

## 2022-06-18 MED ORDER — TIRZEPATIDE 15 MG/0.5ML ~~LOC~~ SOAJ: 15.0000 mg | SUBCUTANEOUS | 5 refills | Status: DC

## 2022-06-18 NOTE — Progress Notes (Signed)
Established Patient Office Visit  Subjective   Patient ID: Lindsay Sanchez, female    DOB: [birth year]  Age: 51 y.o. MRN: [id number]  Chief complaint: follow up on diabetes and weight check  HPI:  Allergic rhinitis: patient is taking both claritin and zyrtec daily to control her allergies. 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 States that her allergies worsen with changing seasons. Reports no SOB, cough or wheezing.   #Hypertension Home blood pressure readings: usually in the normal range. Salt intake and diet: salt not added to cooking. Associated signs and symptoms: none. Patient denies: blurred vision, dyspnea, and headache. Use of agents associated with hypertension: none. Medication compliance: taking as prescribed.    # DIABETES Type II,   Current symptoms/problems include none and have been stable.  Known diabetic complications: none Cardiovascular risk factors: diabetes mellitus, dyslipidemia, hypertension, obesity (BMI >= 30 kg/m2), and sedentary lifestyle Current diabetic medications include  mounjaro injections .  Eye exam current (within one year): yes Foot exam current (within one year): yes Weight trend: decreasing steadily Prior visit with dietician: no Current diet: well balanced Current exercise: aerobics  Current monitoring regimen: home blood tests - daily Any episodes of hypoglycemia? no  Is She on ACE inhibitor or angiotensin II receptor blocker?  Yes  losartan (Cozaar)  The following portions of the patient's history were reviewed and updated as appropriate: current medications, past social history, and problem list.   Current Outpatient Medications  Medication Instructions   Accu-Chek FastClix Lancets MISC Use as directed   albuterol (PROVENTIL HFA;VENTOLIN HFA) 108 (90 Base) MCG/ACT inhaler 2 puffs, Inhalation, Every 6 hours PRN   Blood Glucose Monitoring Suppl (ACCU-CHEK GUIDE ME) w/Device KIT Use as directed    cetirizine (ZYRTEC) 10 mg, Oral, Daily at bedtime   escitalopram (LEXAPRO) 20 mg, Oral, Daily   estradiol (ESTRACE) 2 mg, Oral, Daily   glucose blood (ACCU-CHEK GUIDE) test strip Use as instructed   loratadine (CLARITIN) 10 mg, Oral, Every morning   losartan (COZAAR) 25 mg, Oral, Daily   montelukast (SINGULAIR) 10 mg, Oral, Daily at bedtime   Multiple Vitamin (MULTIVITAMIN) tablet 1 tablet, Daily   OVER THE COUNTER MEDICATION Xylitol melts-at bedtime for dry mouth   rosuvastatin (CRESTOR) 5 mg, Oral, Daily   Spacer/Aero-Holding Chambers (AEROCHAMBER PLUS) inhaler Use as instructed   tirzepatide (MOUNJARO) 15 mg, Subcutaneous, Weekly     Review of Systems  All other systems reviewed and are negative.     Objective:     BP 104/60 (BP Location: Left Arm, Patient Position: Sitting, Cuff Size: Large)   Pulse 75   Temp 97.9 F (36.6 C) (Oral)   Ht 5\' 7"  (1.702 m)   Wt 275 lb 11.2 oz (125.1 kg)   SpO2 96%   BMI 43.18 kg/m  Wt Readings from Last 3 Encounters:  06/18/22 275 lb 11.2 oz (125.1 kg)  03/22/22 282 lb 1.6 oz (128 kg)  12/14/21 287 lb 14.4 oz (130.6 kg)      Physical Exam Vitals reviewed.  Constitutional:      Appearance: Normal appearance. She is well-groomed. She is obese.  HENT:     Head: Normocephalic and atraumatic.  Eyes:     Extraocular Movements: Extraocular movements intact.     Pupils: Pupils are equal, round, and reactive to light.  Cardiovascular:     Rate and Rhythm: Normal rate and regular rhythm.     Pulses: Normal pulses.  Heart sounds: S1 normal and S2 normal.  Pulmonary:     Effort: Pulmonary effort is normal.     Breath sounds: Normal breath sounds and air entry.  Musculoskeletal:        General: Normal range of motion.     Cervical back: Normal range of motion and neck supple.     Right lower leg: No edema.     Left lower leg: No edema.  Skin:    General: Skin is warm and dry.  Neurological:     Mental Status: She is alert and  oriented to person, place, and time. Mental status is at baseline.     Gait: Gait is intact.  Psychiatric:        Mood and Affect: Mood and affect normal.        Speech: Speech normal.        Behavior: Behavior normal.        Judgment: Judgment normal.     Results for orders placed or performed in visit on 06/18/22  POC HgB A1c  Result Value Ref Range   Hemoglobin A1C 6.4 (A) 4.0 - 5.6 %   HbA1c POC (<> result, manual entry)     HbA1c, POC (prediabetic range)     HbA1c, POC (controlled diabetic range)      Last hemoglobin A1c Lab Results  Component Value Date   HGBA1C 6.4 (A) 06/18/2022      The 10-year ASCVD risk score (Arnett DK, et al., 2019) is: 1.4%    Assessment & Plan:   Problem List Items Addressed This Visit       Respiratory   Allergic rhinitis (Chronic)    Patient is taking both claritin and zyrtec and she is still having problems. 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 We talked about adding singulair 10 mg once daily to her regimen and she is agreeable. rx written today      Relevant Medications   montelukast (SINGULAIR) 10 MG tablet     Endocrine   Controlled type 2 diabetes mellitus with complication, without long-term current use of insulin (HCC) - Primary    A1C remains at 6.4 today, controlled. We talked about increasing her mounjaro to 15 mg weekly which is the maximum dose and she is agreeable to this change. She is currently up to date on her foot and eye exam.       Relevant Medications   Accu-Chek FastClix Lancets MISC   glucose blood (ACCU-CHEK GUIDE) test strip   tirzepatide (MOUNJARO) 15 MG/0.5ML Pen   Other Relevant Orders   POC HgB A1c (Completed)     Other   Morbid obesity (HCC)    Weight has gradually improved since being on the mounjaro.  I have had an extensive (>30 minute)  Conversation today with the patient about healthy eating habits, exercise, calorie and carb goals for  sustainable and successful weight loss. She is continuing on a mixed low carb and low calorie diet. Will see her back in 3 months for a weight check and repeat A1C.      Relevant Medications   tirzepatide [facility]) 15 MG/0.5ML Pen   Other Visit Diagnoses     Immunization due       Need for shingles vaccine       Relevant Orders   Zoster Recombinant (Shingrix ) (Completed)       Return in about 3 months (around 09/18/2022) for weight check follow up.  Farrel Conners, MD

## 2022-06-18 NOTE — Assessment & Plan Note (Addendum)
Patient is taking both claritin and zyrtec and she is still having problems. 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 We talked about adding singulair 10 mg once daily to her regimen and she is agreeable. rx written today

## 2022-06-18 NOTE — Telephone Encounter (Signed)
Spoke with Martie Lee and informed her of the instructions as below.

## 2022-06-18 NOTE — Assessment & Plan Note (Signed)
A1C remains at 6.4 today, controlled. We talked about increasing her mounjaro to 15 mg weekly which is the maximum dose and she is agreeable to this change. She is currently up to date on her foot and eye exam.

## 2022-06-18 NOTE — Telephone Encounter (Signed)
Martie Lee from [facility] [phone]  Re:   Accu-Chek FastClix Lancets MISC   Called wanting to know how many times a day will Pt test?  Please advise  [facility] [address] [place], [place] - [place] [address] Phone:  [phone]  Fax:  [phone]

## 2022-06-18 NOTE — Assessment & Plan Note (Signed)
Weight has gradually improved since being on the mounjaro.  I have had an extensive (>30 minute)  Conversation today with the patient about healthy eating habits, exercise, calorie and carb goals for sustainable and successful weight loss. She is continuing on a mixed low carb and low calorie diet. Will see her back in 3 months for a weight check and repeat A1C.

## 2022-06-18 NOTE — Telephone Encounter (Signed)
Once daily in the morning.

## 2022-06-21 ENCOUNTER — Other Ambulatory Visit: Payer: Self-pay | Admitting: Family Medicine

## 2022-06-21 DIAGNOSIS — E118 Type 2 diabetes mellitus with unspecified complications: Secondary | ICD-10-CM

## 2022-06-21 MED ORDER — ACCU-CHEK GUIDE ME W/DEVICE KIT: PACK | 0 refills | Status: DC

## 2022-06-21 MED ORDER — ACCU-CHEK GUIDE VI STRP: ORAL_STRIP | 3 refills | Status: DC

## 2022-06-21 MED ORDER — ACCU-CHEK GUIDE VI STRP: 1.0000 | ORAL_STRIP | Freq: Every day | 3 refills | Status: DC

## 2022-07-01 MED ORDER — CONTOUR BLOOD GLUCOSE SYSTEM W/DEVICE KIT: 1.0000 | PACK | Freq: Every day | 0 refills | Status: AC

## 2022-07-01 MED ORDER — CONTOUR TEST VI STRP: 1.0000 | ORAL_STRIP | Freq: Every day | 3 refills | Status: DC

## 2022-08-02 ENCOUNTER — Telehealth: Payer: Self-pay | Admitting: Family Medicine

## 2022-08-02 NOTE — Telephone Encounter (Signed)
Called Pt to reschedule her 09/20/22 appt with MD.   No answer at [phone]  Left a detailed message for Pt to call back to reschedule.

## 2022-08-02 NOTE — Telephone Encounter (Signed)
Noted  

## 2022-08-27 ENCOUNTER — Ambulatory Visit (INDEPENDENT_AMBULATORY_CARE_PROVIDER_SITE_OTHER): Payer: BC Managed Care – PPO

## 2022-08-27 DIAGNOSIS — Z23 Encounter for immunization: Secondary | ICD-10-CM

## 2022-08-30 ENCOUNTER — Telehealth: Payer: Self-pay | Admitting: Family Medicine

## 2022-08-30 MED ORDER — ROSUVASTATIN CALCIUM 5 MG PO TABS: 5.0000 mg | ORAL_TABLET | Freq: Every day | ORAL | 1 refills | Status: DC

## 2022-08-30 NOTE — Telephone Encounter (Signed)
Rx done. 

## 2022-08-30 NOTE — Telephone Encounter (Signed)
Ok to refill 

## 2022-08-30 NOTE — Telephone Encounter (Signed)
Refill rosuvastatin (CRESTOR) 5 MG tablet   [place] [place] Phone:  [phone]  Fax:  [phone]

## 2022-09-20 ENCOUNTER — Ambulatory Visit: Payer: BC Managed Care – PPO | Admitting: Family Medicine

## 2022-09-23 ENCOUNTER — Ambulatory Visit: Payer: BC Managed Care – PPO | Admitting: Family Medicine

## 2022-09-23 DIAGNOSIS — M722 Plantar fascial fibromatosis: Secondary | ICD-10-CM | POA: Diagnosis not present

## 2022-09-23 DIAGNOSIS — M79671 Pain in right foot: Secondary | ICD-10-CM | POA: Diagnosis not present

## 2022-10-01 ENCOUNTER — Ambulatory Visit: Payer: BC Managed Care – PPO | Admitting: Family Medicine

## 2022-10-01 ENCOUNTER — Encounter: Payer: Self-pay | Admitting: Family Medicine

## 2022-10-01 ENCOUNTER — Ambulatory Visit: Payer: Self-pay | Admitting: Family Medicine

## 2022-10-01 VITALS — BP 120/68 | HR 85 | Temp 98.0°F | Ht 67.0 in | Wt 265.1 lb

## 2022-10-01 DIAGNOSIS — E118 Type 2 diabetes mellitus with unspecified complications: Secondary | ICD-10-CM

## 2022-10-01 DIAGNOSIS — E1165 Type 2 diabetes mellitus with hyperglycemia: Secondary | ICD-10-CM

## 2022-10-01 DIAGNOSIS — F339 Major depressive disorder, recurrent, unspecified: Secondary | ICD-10-CM

## 2022-10-01 DIAGNOSIS — R030 Elevated blood-pressure reading, without diagnosis of hypertension: Secondary | ICD-10-CM

## 2022-10-01 DIAGNOSIS — J3081 Allergic rhinitis due to animal (cat) (dog) hair and dander: Secondary | ICD-10-CM

## 2022-10-01 MED ORDER — ESTRADIOL 2 MG PO TABS: 2.0000 mg | ORAL_TABLET | Freq: Every day | ORAL | 1 refills | Status: AC

## 2022-10-01 MED ORDER — MONTELUKAST SODIUM 10 MG PO TABS: 10.0000 mg | ORAL_TABLET | Freq: Every day | ORAL | 1 refills | Status: DC

## 2022-10-01 MED ORDER — LOSARTAN POTASSIUM 25 MG PO TABS: 25.0000 mg | ORAL_TABLET | Freq: Every day | ORAL | 1 refills | Status: DC

## 2022-10-01 MED ORDER — TIRZEPATIDE 15 MG/0.5ML ~~LOC~~ SOAJ: 15.0000 mg | SUBCUTANEOUS | 11 refills | Status: DC

## 2022-10-01 MED ORDER — ESCITALOPRAM OXALATE 20 MG PO TABS: 20.0000 mg | ORAL_TABLET | Freq: Every day | ORAL | 1 refills | Status: DC

## 2022-10-01 NOTE — Progress Notes (Unsigned)
Established Patient Office Visit  Subjective   Patient ID: Lindsay Sanchez, female    DOB: 1971/08/16  Age: 51 y.o. MRN: [id number]  Chief Complaint  Patient presents with   Weight Check    Patient is here for weight check and follow up. States that she has lost 10 pounds since the last visit. States she is having some aches and pains for the last few months. Right foot plantar fasciitis (sees podiatry) states she was put on meloxicam and right sided back pain, also right shoulder pain.   Pt states she is tolerating the higher dose of the mounjaro, has had a 10 pound weight loss since the last visit. Pt states her blood sugars at home are all under 200. She denies any blurry vision, no polyuria. Last A1C was 6.4.   Current Outpatient Medications  Medication Instructions   Accu-Chek FastClix Lancets MISC Use as directed   albuterol (PROVENTIL HFA;VENTOLIN HFA) 108 (90 Base) MCG/ACT inhaler 2 puffs, Inhalation, Every 6 hours PRN   Blood Glucose Monitoring Suppl (CONTOUR BLOOD GLUCOSE SYSTEM) w/Device KIT 1 strip, Does not apply, Daily   cetirizine (ZYRTEC) 10 mg, Oral, Daily at bedtime   escitalopram (LEXAPRO) 20 mg, Oral, Daily   estradiol (ESTRACE) 2 mg, Oral, Daily   glucose blood (CONTOUR TEST) test strip 1 each, Other, Daily, Use as instructed   loratadine (CLARITIN) 10 mg, Oral, Every morning   losartan (COZAAR) 25 mg, Oral, Daily   montelukast (SINGULAIR) 10 mg, Oral, Daily at bedtime   Multiple Vitamin (MULTIVITAMIN) tablet 1 tablet, Daily   OVER THE COUNTER MEDICATION Xylitol melts-at bedtime for dry mouth   rosuvastatin (CRESTOR) 5 mg, Oral, Daily   Spacer/Aero-Holding Chambers (AEROCHAMBER PLUS) inhaler Use as instructed   tirzepatide (MOUNJARO) 15 mg, Subcutaneous, Weekly    Patient Active Problem List   Diagnosis Date Noted   Allergic rhinitis 06/18/2022   Mild intermittent asthma without complication 37/85/8850   Depression, recurrent (Lindsay Sanchez) 07/25/2020   Morbid  obesity (Lindsay Sanchez) 03/08/2016   Meralgia paresthetica of left side 12/12/2015   Obesity 09/27/2012   Family history of coronary artery disease 09/27/2012   Controlled type 2 diabetes mellitus with complication, without long-term current use of insulin (Lindsay Sanchez)       Review of Systems  All other systems reviewed and are negative.     Objective:     BP 120/68 (BP Location: Left Arm, Patient Position: Sitting, Cuff Size: Large)   Pulse 85   Temp 98 F (36.7 C) (Oral)   Ht _0  (1.702 m)   Wt 265 lb 1.6 oz (120.2 kg)   SpO2 100%   BMI 41.52 kg/m    Physical Exam Vitals reviewed.  Constitutional:      Appearance: Normal appearance. She is well-groomed. She is morbidly obese.  Eyes:     Conjunctiva/sclera: Conjunctivae normal.  Neck:     Thyroid: No thyromegaly.  Cardiovascular:     Rate and Rhythm: Normal rate and regular rhythm.     Pulses: Normal pulses.     Heart sounds: S1 normal and S2 normal.  Pulmonary:     Effort: Pulmonary effort is normal.     Breath sounds: Normal breath sounds and air entry.  Abdominal:     General: Bowel sounds are normal.  Musculoskeletal:     Right lower leg: No edema.     Left lower leg: No edema.  Neurological:     Mental Status: She is alert and oriented to  person, place, and time. Mental status is at baseline.     Gait: Gait is intact.  Psychiatric:        Mood and Affect: Mood and affect normal.        Speech: Speech normal.        Behavior: Behavior normal.        Judgment: Judgment normal.     No results found for any visits on 10/01/22.    The 10-year ASCVD risk score (Arnett DK, et al., 2019) is: 1.9%    Assessment & Plan:   Problem List Items Addressed This Visit       Respiratory   Allergic rhinitis (Chronic)    Singulair 10 mg daily was started at the last visit due to her uncontrolled symptoms and is working well to control her allergies with her zytrec, will refill this medication for her.       Relevant  Medications   montelukast (SINGULAIR) 10 MG tablet     Endocrine   Controlled type 2 diabetes mellitus with complication, without long-term current use of insulin ([place])    Well controlled currently. Will refill her mounjaro 15 mg weekly as well as her losartan 25 mg daily. I have ordered her annual labs today as well. Will see her back in 6 months for DM follow up.      Relevant Medications   tirzepatide (MOUNJARO) 15 MG/0.5ML Pen   estradiol (ESTRACE) 2 MG tablet   losartan (COZAAR) 25 MG tablet   Other Relevant Orders   CMP   Lipid Panel   Hemoglobin A1c   Microalbumin/Creatinine Ratio, Urine     Other   Depression, recurrent (HCC) - Primary    On lexaprop 20 mg daily, she reports good control of her symptoms overall. Will refill this for her .      Relevant Medications   escitalopram (LEXAPRO) 20 MG tablet   Other Visit Diagnoses     Elevated blood pressure reading       Relevant Medications   losartan (COZAAR) 25 MG tablet       Return in about 6 months (around 04/01/2023) for follow up DM-- will be due for annual bloodwork.    Lindsay Conners, MD

## 2022-10-04 NOTE — Assessment & Plan Note (Signed)
Well controlled currently. Will refill her mounjaro 15 mg weekly as well as her losartan 25 mg daily. I have ordered her annual labs today as well. Will see her back in 6 months for DM follow up.

## 2022-10-04 NOTE — Assessment & Plan Note (Signed)
Singulair 10 mg daily was started at the last visit due to her uncontrolled symptoms and is working well to control her allergies with her zytrec, will refill this medication for her.

## 2022-10-04 NOTE — Assessment & Plan Note (Signed)
On lexaprop 20 mg daily, she reports good control of her symptoms overall. Will refill this for her .

## 2022-10-05 ENCOUNTER — Telehealth: Payer: Self-pay

## 2022-10-05 NOTE — Telephone Encounter (Signed)
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

## 2022-10-06 ENCOUNTER — Ambulatory Visit: Payer: BC Managed Care – PPO | Admitting: Family Medicine

## 2022-10-06 ENCOUNTER — Encounter: Payer: Self-pay | Admitting: Family Medicine

## 2022-10-06 VITALS — BP 124/80 | HR 81 | Temp 98.2°F | Wt 263.0 lb

## 2022-10-06 DIAGNOSIS — J020 Streptococcal pharyngitis: Secondary | ICD-10-CM

## 2022-10-06 DIAGNOSIS — R059 Cough, unspecified: Secondary | ICD-10-CM

## 2022-10-06 LAB — POC INFLUENZA A&B (BINAX/QUICKVUE)
Influenza A, POC: NEGATIVE
Influenza B, POC: NEGATIVE

## 2022-10-06 LAB — POCT RAPID STREP A (OFFICE): Rapid Strep A Screen: POSITIVE — AB

## 2022-10-06 LAB — POC COVID19 BINAXNOW: SARS Coronavirus 2 Ag: NEGATIVE

## 2022-10-06 MED ORDER — CEFUROXIME AXETIL 500 MG PO TABS: 500.0000 mg | ORAL_TABLET | Freq: Two times a day (BID) | ORAL | 0 refills | Status: AC

## 2022-10-06 NOTE — Progress Notes (Signed)
   Subjective:    Patient ID: Lindsay Sanchez, female    DOB: 21-Oct-1971, 51 y.o.   MRN: [id number]  HPI Here for 5 days of low grade fevers, a dry cough, ST, PND, fatigue, body aches, and nausea with vomiting. No diarrhea. She has been drinking fluids but she cannot keep food down. She just remembered this morning that she has some Zofran at home, and this has been helpful today for the nausea. She also has Benzonatate at home to use for the cough.    Review of Systems  Constitutional:  Positive for fatigue and fever.  HENT:  Positive for congestion, postnasal drip and sore throat. Negative for ear pain and sinus pressure.   Eyes: Negative.   Respiratory:  Positive for cough and wheezing. Negative for shortness of breath.   Cardiovascular: Negative.   Gastrointestinal:  Positive for nausea and vomiting. Negative for abdominal distention, abdominal pain, blood in stool, constipation and diarrhea.       Objective:   Physical Exam Constitutional:      Appearance: Normal appearance. She is not ill-appearing.  HENT:     Right Ear: Tympanic membrane, ear canal and external ear normal.     Left Ear: Tympanic membrane, ear canal and external ear normal.     Nose: Nose normal.     Mouth/Throat:     Pharynx: Posterior oropharyngeal erythema present. No oropharyngeal exudate.  Eyes:     Conjunctiva/sclera: Conjunctivae normal.  Pulmonary:     Effort: Pulmonary effort is normal.     Breath sounds: Normal breath sounds.  Lymphadenopathy:     Cervical: No cervical adenopathy.  Neurological:     Mental Status: She is alert.           Assessment & Plan:  Strep pharyngitis, treat with 10 days of Cefuroxime. Recheck as needed. Gershon Crane, MD

## 2022-10-06 NOTE — Addendum Note (Signed)
Addended by: Carola Rhine on: 10/06/2022 04:18 PM   Modules accepted: Orders

## 2022-11-17 DIAGNOSIS — M25562 Pain in left knee: Secondary | ICD-10-CM | POA: Diagnosis not present

## 2022-11-17 DIAGNOSIS — M1712 Unilateral primary osteoarthritis, left knee: Secondary | ICD-10-CM | POA: Diagnosis not present

## 2022-12-06 DIAGNOSIS — M170 Bilateral primary osteoarthritis of knee: Secondary | ICD-10-CM | POA: Diagnosis not present

## 2022-12-06 DIAGNOSIS — Z6841 Body Mass Index (BMI) 40.0 and over, adult: Secondary | ICD-10-CM | POA: Diagnosis not present

## 2022-12-06 DIAGNOSIS — M25562 Pain in left knee: Secondary | ICD-10-CM | POA: Diagnosis not present

## 2022-12-19 DIAGNOSIS — M25562 Pain in left knee: Secondary | ICD-10-CM | POA: Diagnosis not present

## 2022-12-27 DIAGNOSIS — Z6841 Body Mass Index (BMI) 40.0 and over, adult: Secondary | ICD-10-CM | POA: Diagnosis not present

## 2022-12-27 DIAGNOSIS — M25562 Pain in left knee: Secondary | ICD-10-CM | POA: Diagnosis not present

## 2023-01-24 ENCOUNTER — Other Ambulatory Visit: Payer: Self-pay | Admitting: Family Medicine

## 2023-01-24 DIAGNOSIS — Z1231 Encounter for screening mammogram for malignant neoplasm of breast: Secondary | ICD-10-CM

## 2023-02-07 ENCOUNTER — Telehealth: Payer: Self-pay | Admitting: Family Medicine

## 2023-02-07 DIAGNOSIS — E118 Type 2 diabetes mellitus with unspecified complications: Secondary | ICD-10-CM

## 2023-02-07 NOTE — Telephone Encounter (Signed)
Would she be ok with taking 12.5  mg for the time being until the 15 mg is available? If so then I will call in

## 2023-02-07 NOTE — Telephone Encounter (Signed)
[place], [place] [address] Phone: [phone]  Fax: [phone]     Called to say they are out of stock for the [facility] in her dosage, but they do have 12.5 mg dosage.  Pharmacy says no one in their area has Pt's dosage at all.   Or an Rx for an alternate can be sent in.

## 2023-02-07 NOTE — Telephone Encounter (Signed)
Patient informed of the message below and agreed to begin taking 12.5mg .  Message sent to PCP.

## 2023-02-08 MED ORDER — TIRZEPATIDE 12.5 MG/0.5ML ~~LOC~~ SOAJ: 12.5000 mg | SUBCUTANEOUS | 5 refills | Status: DC

## 2023-02-08 NOTE — Addendum Note (Signed)
Addended by: Farrel Conners on: 02/08/2023 10:36 AM   Modules accepted: Orders

## 2023-02-14 DIAGNOSIS — R053 Chronic cough: Secondary | ICD-10-CM | POA: Diagnosis not present

## 2023-02-14 DIAGNOSIS — K219 Gastro-esophageal reflux disease without esophagitis: Secondary | ICD-10-CM | POA: Diagnosis not present

## 2023-02-18 DIAGNOSIS — S83242A Other tear of medial meniscus, current injury, left knee, initial encounter: Secondary | ICD-10-CM | POA: Diagnosis not present

## 2023-02-25 ENCOUNTER — Other Ambulatory Visit: Payer: Self-pay | Admitting: Family Medicine

## 2023-02-25 DIAGNOSIS — R053 Chronic cough: Secondary | ICD-10-CM | POA: Diagnosis not present

## 2023-02-25 DIAGNOSIS — J329 Chronic sinusitis, unspecified: Secondary | ICD-10-CM | POA: Diagnosis not present

## 2023-02-25 DIAGNOSIS — J3489 Other specified disorders of nose and nasal sinuses: Secondary | ICD-10-CM | POA: Diagnosis not present

## 2023-03-07 DIAGNOSIS — E119 Type 2 diabetes mellitus without complications: Secondary | ICD-10-CM | POA: Diagnosis not present

## 2023-03-07 DIAGNOSIS — H04123 Dry eye syndrome of bilateral lacrimal glands: Secondary | ICD-10-CM | POA: Diagnosis not present

## 2023-03-07 DIAGNOSIS — H1045 Other chronic allergic conjunctivitis: Secondary | ICD-10-CM | POA: Diagnosis not present

## 2023-03-07 DIAGNOSIS — H353131 Nonexudative age-related macular degeneration, bilateral, early dry stage: Secondary | ICD-10-CM | POA: Diagnosis not present

## 2023-03-07 LAB — HM DIABETES EYE EXAM

## 2023-03-14 ENCOUNTER — Ambulatory Visit
Admission: RE | Admit: 2023-03-14 | Discharge: 2023-03-14 | Disposition: A | Discharge status: Home / Self Care | Payer: BC Managed Care – PPO | Source: Ambulatory Visit

## 2023-03-14 DIAGNOSIS — Z1231 Encounter for screening mammogram for malignant neoplasm of breast: Secondary | ICD-10-CM

## 2023-03-31 ENCOUNTER — Encounter: Payer: Self-pay | Admitting: Family Medicine

## 2023-03-31 ENCOUNTER — Telehealth (INDEPENDENT_AMBULATORY_CARE_PROVIDER_SITE_OTHER): Payer: BC Managed Care – PPO | Admitting: Family Medicine

## 2023-03-31 DIAGNOSIS — J029 Acute pharyngitis, unspecified: Secondary | ICD-10-CM

## 2023-03-31 DIAGNOSIS — H60501 Unspecified acute noninfective otitis externa, right ear: Secondary | ICD-10-CM

## 2023-03-31 MED ORDER — CEFUROXIME AXETIL 500 MG PO TABS: 500.0000 mg | ORAL_TABLET | Freq: Two times a day (BID) | ORAL | 0 refills | Status: AC

## 2023-03-31 MED ORDER — CIPROFLOXACIN-DEXAMETHASONE 0.3-0.1 % OT SUSP: 4.0000 [drp] | Freq: Two times a day (BID) | OTIC | 0 refills | Status: DC

## 2023-03-31 NOTE — Progress Notes (Signed)
Subjective:    Patient ID: Lindsay Sanchez, female    DOB: May 06, 1971, 52 y.o.   MRN: [id number]  HPI Virtual Visit via Video Note  I connected with the patient on 03/31/23 at  9:15 AM EDT by a video enabled telemedicine application and verified that I am speaking with the correct person using two identifiers.  Location patient: home Location provider:work or home office Persons participating in the virtual visit: patient, provider  I discussed the limitations of evaluation and management by telemedicine and the availability of in person appointments. The patient expressed understanding and agreed to proceed.   HPI: Here for 4 days of a bad ST, bright red patches seen in the back of her throat, and right ear pain. No fever or cough. She says this is very similar to what she had in November, when we treated her for a strep throat.    ROS: See pertinent positives and negatives per HPI.  Past Medical History:  Diagnosis Date   Anxiety    anxiety attacks are infrequent   Asthma    Chest tightness    Diabetes (HCC)    Dry mouth    patient states her dentist diagnosed iwth dry mouth-uses Xylitol tabs qhs   Macular degeneration    per patient-treated by Dr Dione Booze   Meralgia paresthetica of left side 12/12/2015   Obesity 09/27/2012   Urinary frequency     Past Surgical History:  Procedure Laterality Date   ABDOMINAL HYSTERECTOMY     precancer cells on pap smear; thinks left ovar remains   CESAREAN SECTION     FOOT SURGERY     cyst removal   HAMMERTOE RECONSTRUCTION WITH WEIL OSTEOTOMY Right 12/25/2015   Procedure: RIGHT THIRD HAMMERTOE CORRECTION, WEIL OSTEOTOMY;  Surgeon: Toni Arthurs, MD;  Location: [facility];  Service: Orthopedics;  Laterality: Right;   HAND SURGERY     HARDWARE REMOVAL Right 12/25/2015   Procedure: RIGHT FIFTH METATARSAL DEEP IMPLANT REMOVAL, EXOSTECTOMY;  Surgeon: Toni Arthurs, MD;  Location: [facility];  Service: Orthopedics;   Laterality: Right;   MOUTH SURGERY     [facility]--removal of cyst per patient-awaiting path results   NECK SURGERY     cervical fusion   ROOT CANAL     TONSILLECTOMY      Family History  Problem Relation Age of Onset   Arrhythmia Mother        V TACH   Ovarian cancer Mother    Breast cancer Mother 43   Heart attack Father 20   Heart attack Maternal Grandfather    Cancer Maternal Grandfather    Heart disease Paternal Grandmother    Sudden Cardiac Death Brother    Heart attack Brother    Breast cancer Maternal Aunt    Breast cancer Maternal Aunt    Breast cancer Cousin        maternal first cousin     Current Outpatient Medications:    Accu-Chek FastClix Lancets MISC, Use as directed, Disp: 102 each, Rfl: 3   albuterol (PROVENTIL HFA;VENTOLIN HFA) 108 (90 Base) MCG/ACT inhaler, Inhale 2 puffs into the lungs every 6 (six) hours as needed., Disp: 1 Inhaler, Rfl: 0   Blood Glucose Monitoring Suppl (CONTOUR BLOOD GLUCOSE SYSTEM) w/Device KIT, 1 strip by Does not apply route daily., Disp: 1 kit, Rfl: 0   cetirizine (ZYRTEC) 10 MG tablet, Take 10 mg by mouth at bedtime., Disp: , Rfl:    escitalopram (LEXAPRO) 20  MG tablet, Take 1 tablet (20 mg total) by mouth daily., Disp: 90 tablet, Rfl: 1   estradiol (ESTRACE) 2 MG tablet, Take 1 tablet (2 mg total) by mouth daily., Disp: 90 tablet, Rfl: 1   glucose blood (CONTOUR TEST) test strip, 1 each by Other route daily. Use as instructed, Disp: 100 each, Rfl: 3   loratadine (CLARITIN) 10 MG tablet, Take 10 mg by mouth every morning., Disp: , Rfl:    losartan (COZAAR) 25 MG tablet, Take 1 tablet (25 mg total) by mouth daily., Disp: 90 tablet, Rfl: 1   montelukast (SINGULAIR) 10 MG tablet, Take 1 tablet (10 mg total) by mouth at bedtime., Disp: 90 tablet, Rfl: 1   Multiple Vitamin (MULTIVITAMIN) tablet, Take 1 tablet by mouth daily., Disp: , Rfl:    OVER THE COUNTER MEDICATION, Xylitol melts-at bedtime for dry mouth, Disp: , Rfl:     rosuvastatin (CRESTOR) 5 MG tablet, Take 1 tablet by mouth once daily, Disp: 90 tablet, Rfl: 0   Spacer/Aero-Holding Chambers (AEROCHAMBER PLUS) inhaler, Use as instructed, Disp: 1 each, Rfl: 0   tirzepatide (MOUNJARO) 12.5 MG/0.5ML Pen, Inject 12.5 mg into the skin once a week., Disp: 6 mL, Rfl: 5  EXAM:  VITALS per patient if applicable:  GENERAL: alert, oriented, appears well and in no acute distress  HEENT: atraumatic, conjunttiva clear, no obvious abnormalities on inspection of external nose and ears  NECK: normal movements of the head and neck  LUNGS: on inspection no signs of respiratory distress, breathing rate appears normal, no obvious gross SOB, gasping or wheezing  CV: no obvious cyanosis  MS: moves all visible extremities without noticeable abnormality  PSYCH/NEURO: pleasant and cooperative, no obvious depression or anxiety, speech and thought processing grossly intact  ASSESSMENT AND PLAN: Pharyngitis and otitis. Treat with 10 days of Cefuroxime as well as Ciprodex drops. Recheck as needed.  Gershon Crane, MD  Discussed the following assessment and plan:  No diagnosis found.     I discussed the assessment and treatment plan with the patient. The patient was provided an opportunity to ask questions and all were answered. The patient agreed with the plan and demonstrated an understanding of the instructions.   The patient was advised to call back or seek an in-person evaluation if the symptoms worsen or if the condition fails to improve as anticipated.      Review of Systems     Objective:   Physical Exam        Assessment & Plan:

## 2023-04-04 ENCOUNTER — Ambulatory Visit: Payer: BC Managed Care – PPO | Admitting: Urgent Care

## 2023-04-04 ENCOUNTER — Encounter: Payer: Self-pay | Admitting: Urgent Care

## 2023-04-04 ENCOUNTER — Encounter: Payer: Self-pay | Admitting: Family Medicine

## 2023-04-04 VITALS — BP 154/88 | HR 73 | Temp 98.2°F | Ht 67.0 in | Wt 259.2 lb

## 2023-04-04 DIAGNOSIS — G44209 Tension-type headache, unspecified, not intractable: Secondary | ICD-10-CM | POA: Diagnosis not present

## 2023-04-04 DIAGNOSIS — M542 Cervicalgia: Secondary | ICD-10-CM | POA: Diagnosis not present

## 2023-04-04 DIAGNOSIS — H9209 Otalgia, unspecified ear: Secondary | ICD-10-CM | POA: Insufficient documentation

## 2023-04-04 DIAGNOSIS — H9203 Otalgia, bilateral: Secondary | ICD-10-CM | POA: Diagnosis not present

## 2023-04-04 DIAGNOSIS — R198 Other specified symptoms and signs involving the digestive system and abdomen: Secondary | ICD-10-CM

## 2023-04-04 DIAGNOSIS — R030 Elevated blood-pressure reading, without diagnosis of hypertension: Secondary | ICD-10-CM | POA: Insufficient documentation

## 2023-04-04 MED ORDER — BACLOFEN 10 MG PO TABS: 10.0000 mg | ORAL_TABLET | Freq: Three times a day (TID) | ORAL | 0 refills | Status: DC

## 2023-04-04 MED ORDER — DICLOFENAC SODIUM 75 MG PO TBEC
75.0000 mg | DELAYED_RELEASE_TABLET | Freq: Two times a day (BID) | ORAL | 0 refills | Status: DC
Start: 2023-04-04 — End: 2023-05-23

## 2023-04-04 NOTE — Progress Notes (Signed)
Acute Office Visit  Subjective:     Patient ID: Lindsay Sanchez, female    DOB: [birth year], 52 y.o.   MRN: [id number]  Chief Complaint  Patient presents with   Neck Pain    Pt c/o neck pain that hurts to turn her neck on both sides. Going on over a month. Gradually worsen. Also mention hard to swallow and wonders if it has to do with mounjaro.     Neck Pain    Patient is in today for neck pain. 52yo female presents today for ongoing neck pain. She states it has been present for the past month. Reports the pain is on both side of her neck, extending into B shoulders/ traps and behind b ears. It does also occasionally cause a temporal/ parietal tight pain as well. She does admit to clenching her teeth. Recently had dental work done. She denies any excessive stress. 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 She has not tried any treatments for her current symptoms. States today the pain was so bad it hurt to turn her neck to the R. Denies fever, photophobia, nuchal rigidity. Additionally, pt had a sore throat last week. Started on ceftin and states the throat is healing well, still is a bit scratchy. Pt takes daily montelukast for her allergies which works well.   Review of Systems  Musculoskeletal:  Positive for neck pain.   As per HPI     Objective:    BP (!) 154/88 (BP Location: Right Arm, Cuff Size: Large)   Pulse 73   Temp 98.2 F (36.8 C) (Oral)   Ht 5\' 7"  (1.702 m)   Wt 259 lb 3.2 oz (117.6 kg)   SpO2 98%   BMI 40.60 kg/m   BP Readings from Last 3 Encounters:  04/04/23 (!) 154/88  10/06/22 124/80  10/01/22 120/68     Physical Exam Vitals and nursing note reviewed.  Constitutional:      General: She is not in acute distress.    Appearance: Normal appearance. She is obese. She is not ill-appearing, toxic-appearing or diaphoretic.  HENT:     Head: Normocephalic and atraumatic.     Right Ear: Tympanic  membrane, ear canal and external ear normal. There is no impacted cerumen.     Left Ear: Tympanic membrane, ear canal and external ear normal. There is no impacted cerumen.     Nose: Nose normal. No congestion or rhinorrhea.     Mouth/Throat:     Mouth: Mucous membranes are moist.     Pharynx: Oropharynx is clear. Posterior oropharyngeal erythema (posterior pharynx only) present. No oropharyngeal exudate.  Eyes:     General: No scleral icterus.       Right eye: No discharge.        Left eye: No discharge.     Pupils: Pupils are equal, round, and reactive to light.  Neck:     Thyroid: No thyroid mass, thyromegaly or thyroid tenderness.     Trachea: Trachea normal.     Meningeal: Brudzinski's sign absent.   Cardiovascular:     Rate and Rhythm: Normal rate.     Pulses: Normal pulses.     Heart sounds: No murmur heard. Pulmonary:     Effort: Pulmonary effort is normal. No respiratory distress.     Breath sounds: Normal breath sounds. No stridor. No wheezing or rhonchi.  Musculoskeletal:  Cervical back: Neck supple. No swelling, edema, erythema, signs of trauma, rigidity, spasms, torticollis, bony tenderness or crepitus. Muscular tenderness (as annotated in pics; B scalenes and traps) present. No spinous process tenderness. Decreased range of motion (due to muscle tightness).     Thoracic back: Normal. No swelling or deformity.  Lymphadenopathy:     Cervical: No cervical adenopathy.  Skin:    General: Skin is warm and dry.     Capillary Refill: Capillary refill takes less than 2 seconds.     Findings: No bruising, erythema or rash.  Neurological:     General: No focal deficit present.     Mental Status: She is alert and oriented to person, place, and time.     No results found for any visits on 04/04/23.      Assessment & Plan:   Problem List Items Addressed This Visit       Other   Ear pain          Pt currently on ciprodex drops. I do not see evidence of OM or OE. I  suspect part of her neck/ ear pain is from clenching. Will start NSAID and baclofen to cover for suspected TMJ syndrome. May need to follow up with dentist for bite guard or other mechanisms of tx plan fails.   Other Visit Diagnoses     Trigger point with neck pain    -  Primary       Recommended moist heat TID, massage, NSAID and muscle relaxer. If fails, possible trigger point injection vs dry needling.   Tension headache       Relevant Medications   baclofen (LIORESAL) 10 MG tablet   diclofenac (VOLTAREN) 75 MG EC tablet  Secondary to her neck tension. Will DC current mobic, start diclofenac bid with food x 10 days, then if needed, resume the mobic. Take baclofen TID, sedation precaution.  PT referral for dry needling if refractory to current treatment plan.    Clenching of teeth         Suspect Tmj. Tx plan listed above    Elevated blood pressure reading                Prior BP in office normal. Suspect this is secondary to acute pain. Recheck was 148/90. Will start tx for acute neck issues, pt will need to monitor BP at home and RTC if any elevations persist once acute neck pain resolved.     Meds ordered this encounter  Medications   baclofen (LIORESAL) 10 MG tablet    Sig: Take 1 tablet (10 mg total) by mouth 3 (three) times daily.    Dispense:  30 each    Refill:  0   diclofenac (VOLTAREN) 75 MG EC tablet    Sig: Take 1 tablet (75 mg total) by mouth 2 (two) times daily.    Dispense:  20 tablet    Refill:  0    No follow-ups on file.  Rafiq Bucklin L Mahitha Hickling, PA

## 2023-04-04 NOTE — Assessment & Plan Note (Signed)
Pt currently on ciprodex drops. I do not see evidence of OM or OE. I suspect part of her neck/ ear pain is from clenching. Will start NSAID and baclofen to cover for suspected TMJ syndrome. May need to follow up with dentist for bite guard or other mechanisms of tx plan fails.

## 2023-04-04 NOTE — Assessment & Plan Note (Signed)
Prior BP in office normal. Suspect this is secondary to acute pain. Recheck was 148/90. Will start tx for acute neck issues, pt will need to monitor BP at home and RTC if any elevations persist once acute neck pain resolved.

## 2023-04-04 NOTE — Patient Instructions (Addendum)
Your symptoms are related to tight, knotted and tense muscles in your neck.  Please start taking the baclofen three times daily as needed. This is a muscle relaxer. It may make you feel tired after taking it so do not drive a car or operate machinery until you know how it affects you.  Please use a moist heat compress to the neck and shoulders. A microwaveable pack is preferred, three times daily. Immediately after heat application, have someone apply direct pressure to the affected muscles. Please look up "ischemic compression" - this will help relax the tension. (In essence, apply sustained pressure for a long period of time to inactivate trigger points)  Take the diclofenac twice daily with food for 7-10 days. DO NOT take your meloxicam or any other NSAIDs (ibuprofen, naproxen, advil, motrin, aleve, etc) while on the diclofenac.  If your symptoms persist, consider physical therapy referral for dry needling.

## 2023-04-12 IMAGING — MG MM DIGITAL SCREENING BILAT W/ TOMO AND CAD
8 series · 8 of 24 positions shown · non-contrast
Comparison: Previous exam(s).

CLINICAL DATA: Screening.

EXAM:
DIGITAL SCREENING BILATERAL MAMMOGRAM WITH TOMOSYNTHESIS AND CAD
TECHNIQUE: Bilateral screening digital craniocaudal and mediolateral oblique
mammograms were obtained. Bilateral screening digital breast
tomosynthesis was performed. The images were evaluated with
computer-aided detection.

[L CC synth-2D]
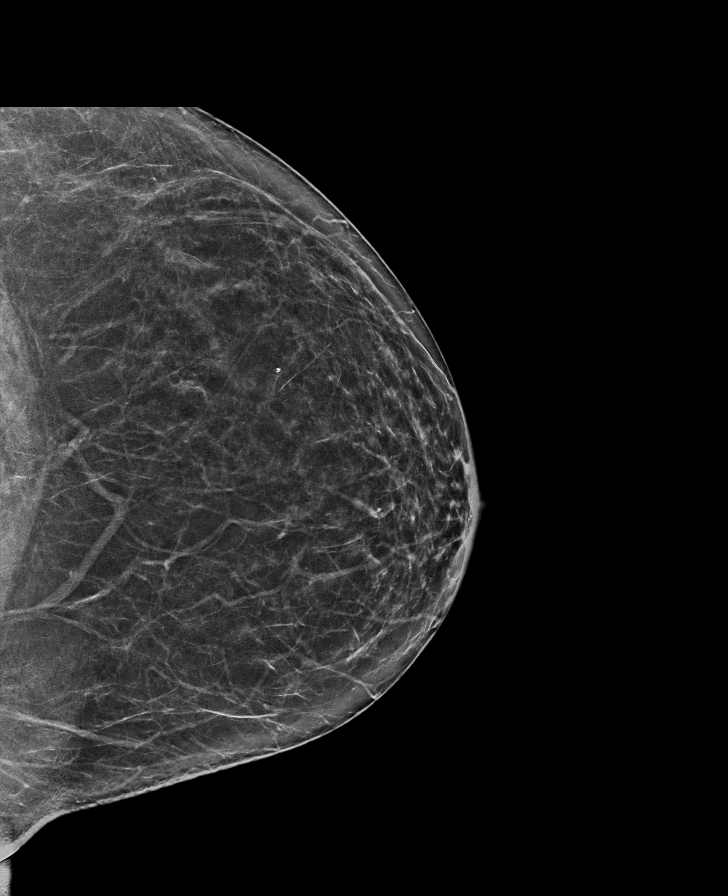

[R CC synth-2D]
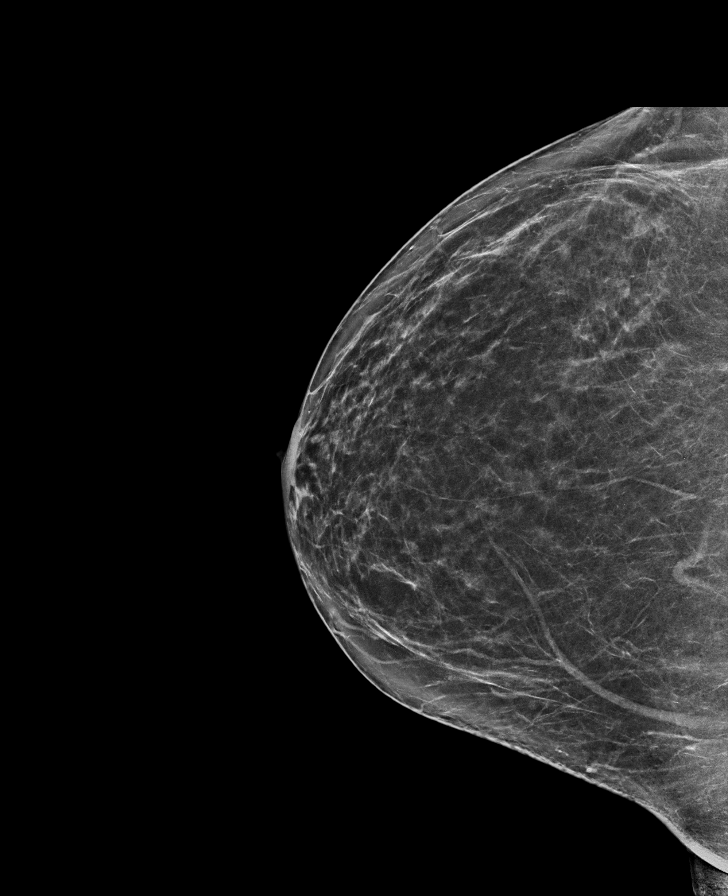

[R MLO synth-2D]
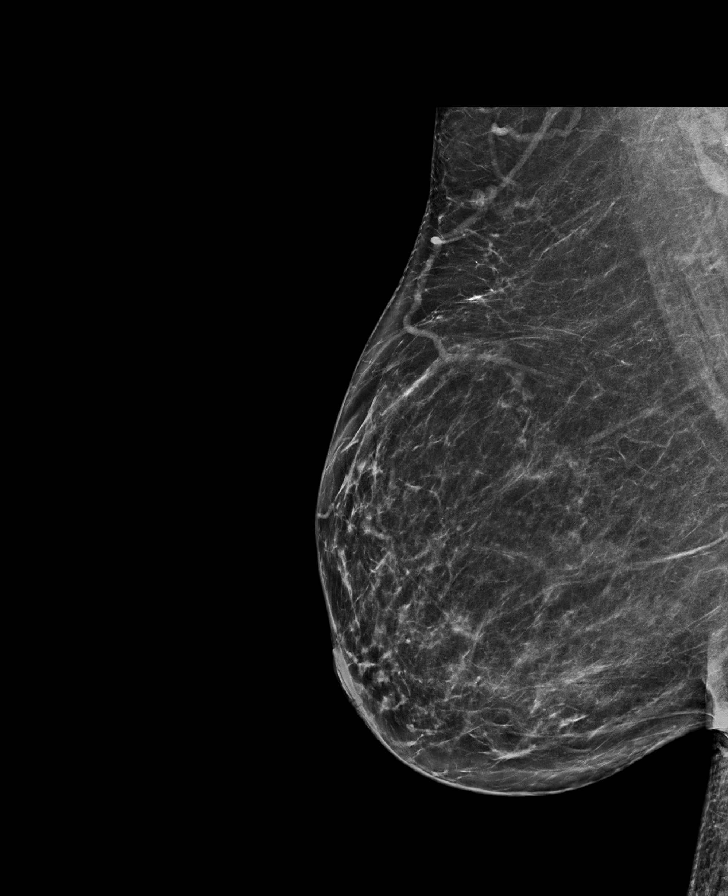

[L MLO synth-2D]
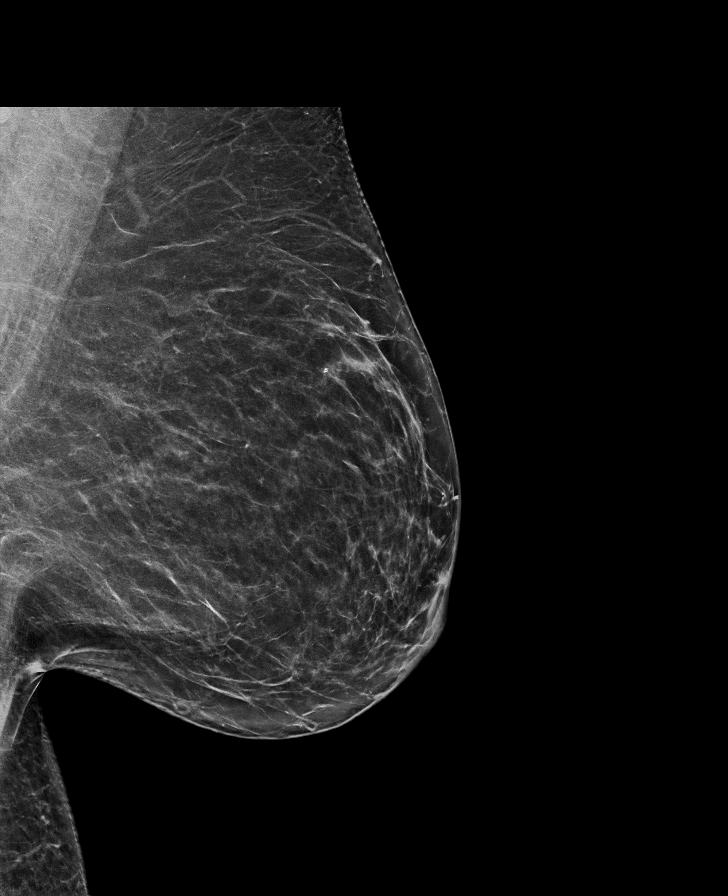

[R CC tomo · tomo slice 37/72.0]
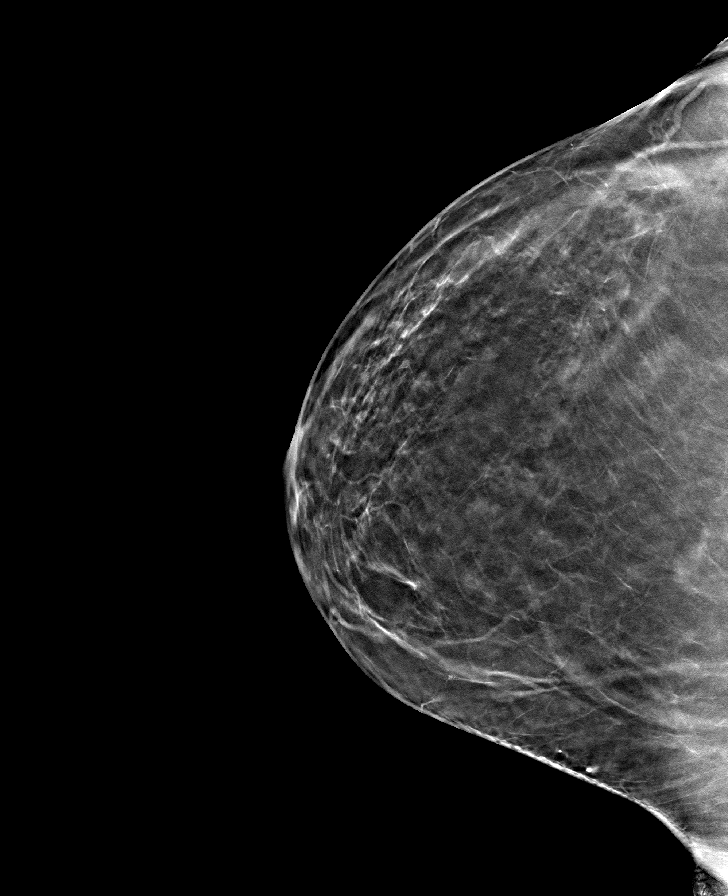

[R MLO tomo · tomo slice 40/79.0]
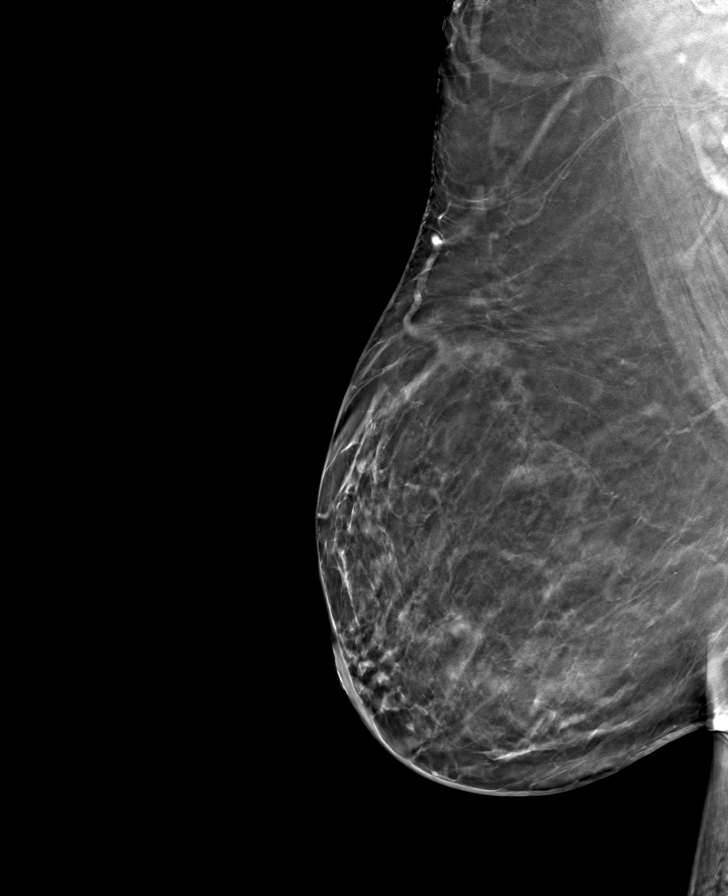

[L CC tomo · tomo slice 37/74.0]
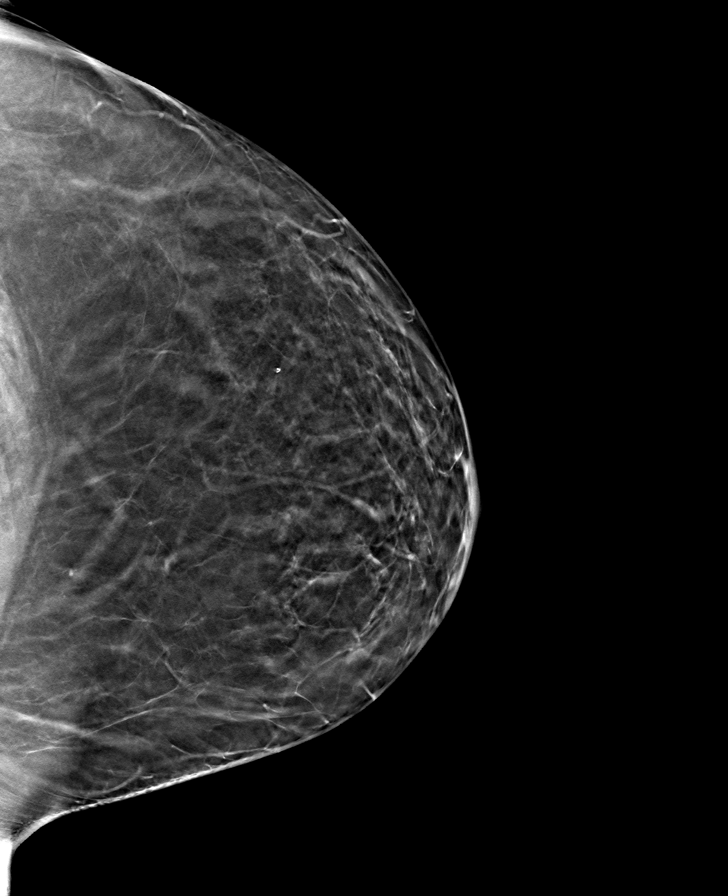

[L MLO tomo · tomo slice 39/76.0]
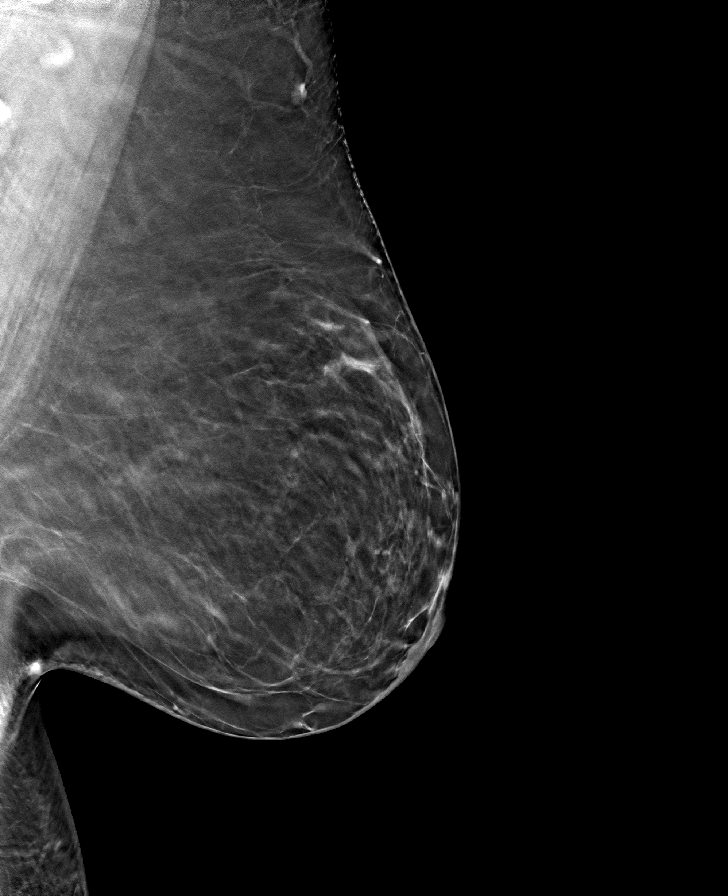

[8 of 24 positions shown; findings below may reference images not displayed]

ACR Breast Density Category b: There are scattered areas of
fibroglandular density.
FINDINGS: There are no findings suspicious for malignancy.
IMPRESSION: No mammographic evidence of malignancy. A result letter of this
screening mammogram will be mailed directly to the patient.

RECOMMENDATION:
Screening mammogram in one year. (Code:51-O-LD2)

BI-RADS CATEGORY  1: Negative.

## 2023-04-20 DIAGNOSIS — Z6841 Body Mass Index (BMI) 40.0 and over, adult: Secondary | ICD-10-CM | POA: Diagnosis not present

## 2023-04-20 DIAGNOSIS — Z01419 Encounter for gynecological examination (general) (routine) without abnormal findings: Secondary | ICD-10-CM | POA: Diagnosis not present

## 2023-04-25 ENCOUNTER — Telehealth: Payer: Self-pay | Admitting: Family Medicine

## 2023-04-25 DIAGNOSIS — M542 Cervicalgia: Secondary | ICD-10-CM

## 2023-04-25 NOTE — Telephone Encounter (Signed)
She will need a referral to physical therapy for dry needling, ok to send referral

## 2023-04-25 NOTE — Telephone Encounter (Signed)
saw Lindsay Sanchez on 04/04/23 for neck pain. still can't turn neck, still painful. finished meds. Says [facility] suggested dry needling Please advise

## 2023-04-25 NOTE — Telephone Encounter (Signed)
Left a detailed message at the patient's cell number the referral for PT was entered as below and someone will contact her with appt info.

## 2023-04-25 NOTE — Addendum Note (Signed)
Addended by: Johnella Moloney on: 04/25/2023 01:05 PM   Modules accepted: Orders

## 2023-05-02 ENCOUNTER — Ambulatory Visit: Payer: BC Managed Care – PPO | Attending: Family Medicine | Admitting: Physical Therapy

## 2023-05-02 ENCOUNTER — Other Ambulatory Visit: Payer: Self-pay

## 2023-05-02 DIAGNOSIS — M542 Cervicalgia: Secondary | ICD-10-CM | POA: Diagnosis not present

## 2023-05-02 DIAGNOSIS — M62838 Other muscle spasm: Secondary | ICD-10-CM | POA: Insufficient documentation

## 2023-05-02 NOTE — Therapy (Signed)
OUTPATIENT PHYSICAL THERAPY CERVICAL EVALUATION   Patient Name: Lindsay Sanchez MRN: [id number] DOB:04-May-1971, 52 y.o., female Today's Date: 05/02/2023  END OF SESSION:  PT End of Session - 05/02/23 1223     Visit Number 1    Number of Visits 8    Date for PT Re-Evaluation 05/30/23    PT Start Time 1107    PT Stop Time 1202    PT Time Calculation (min) 55 min    Activity Tolerance Patient tolerated treatment well    Behavior During Therapy [facility] for tasks assessed/performed             Past Medical History:  Diagnosis Date   Anxiety    anxiety attacks are infrequent   Asthma    Chest tightness    Diabetes (HCC)    Dry mouth    patient states her dentist diagnosed iwth dry mouth-uses Xylitol tabs qhs   Macular degeneration    per patient-treated by Dr Dione Booze   Meralgia paresthetica of left side 12/12/2015   Obesity 09/27/2012   Urinary frequency    Past Surgical History:  Procedure Laterality Date   ABDOMINAL HYSTERECTOMY     precancer cells on pap smear; thinks left ovar remains   CESAREAN SECTION     FOOT SURGERY     cyst removal   HAMMERTOE RECONSTRUCTION WITH WEIL OSTEOTOMY Right 12/25/2015   Procedure: RIGHT THIRD HAMMERTOE CORRECTION, WEIL OSTEOTOMY;  Surgeon: Toni Arthurs, MD;  Location: [facility];  Service: Orthopedics;  Laterality: Right;   HAND SURGERY     HARDWARE REMOVAL Right 12/25/2015   Procedure: RIGHT FIFTH METATARSAL DEEP IMPLANT REMOVAL, EXOSTECTOMY;  Surgeon: Toni Arthurs, MD;  Location: [facility];  Service: Orthopedics;  Laterality: Right;   MOUTH SURGERY     [facility]--removal of cyst per patient-awaiting path results   NECK SURGERY     cervical fusion   ROOT CANAL     TONSILLECTOMY     Patient Active Problem List   Diagnosis Date Noted   Ear pain 04/04/2023   Elevated blood pressure reading 04/04/2023   Allergic rhinitis 06/18/2022   Mild intermittent asthma without complication 07/25/2020    Depression, recurrent (HCC) 07/25/2020   Morbid obesity (HCC) 03/08/2016   Meralgia paresthetica of left side 12/12/2015   Obesity 09/27/2012   Family history of coronary artery disease 09/27/2012   Controlled type 2 diabetes mellitus with complication, without long-term current use of insulin (HCC)      REFERRING PROVIDER: Nira Conn  REFERRING DIAG: Trigger point with neck pain.  THERAPY DIAG:  Cervicalgia  Other muscle spasm  Rationale for Evaluation and Treatment: Rehabilitation  ONSET DATE: December, 2023.  SUBJECTIVE:  SUBJECTIVE STATEMENT: The patient presents to the clinic with c/o bilateral neck pain and headaches.  She states her pain began around December of last year.  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  Her pain is rated from a 5-8/10.  Sudden movements increase her pain, heat and stretching decrease her pain.  It has been harder to drive as she has lost some range of motion since the onset of her pain.    PERTINENT HISTORY:  ACDF, knee injury.  PAIN:  Are you having pain? Yes: NPRS scale: 5-8/10 Pain location: Bilateral neck. Pain description: Sharp and numb. Aggravating factors: As above. Relieving factors: As above.  PRECAUTIONS: Other: ACDF.  WEIGHT BEARING RESTRICTIONS: No  FALLS:  Has patient fallen in last 6 months? No  LIVING ENVIRONMENT: Lives with: lives with their spouse Lives in: House/apartment Has following equipment at home: None  OCCUPATION: Dog groomer.  PLOF: Independent  PATIENT GOALS: Not have this pain and move neck better.   OBJECTIVE:   POSTURE:   Forward head and rounded  shoulders.  PALPATION: Tender over bilateral suboccipital musculature, right medial scapular region and bilateral UT's and Levator Scapulae muscualture with notable trigger points.   CERVICAL ROM:  Right active cervical rotation is 50 degrees and left is 60 degrees.  UPPER EXTREMITY ROM:  WNL.  UPPER EXTREMITY MMT:  Normal UE strength.  SPECIAL TESTS:  Normal UE DTR's.   TODAY'S TREATMENT:                                                                                                                              DATE: HMP and IFC at 80-150 Hz on 40% scan x 20 minutes to bilateral cervical musculature.  Normal modality response following removal of modality.   PATIENT EDUCATION:  Education details: Correct posture.  Chin tucks. Person educated: Patient Education method: Explanation Education comprehension: verbalized understanding  HOME EXERCISE PROGRAM: As above.  ASSESSMENT:  CLINICAL IMPRESSION: The patient presents to the clinic with c/o bilateral neck pain, headaches and decreased range of motion.  This has been ongoing since December of last year.  She has a loss of active bilateral cervical rotation.  She has diffuse palpable cervical musculature tenderness especially over her UT and Levator Scapulae.  UE strength is normal as are her UE DTR's. Patient will benefit from skilled physical therapy intervention to address pain and deficits.  OBJECTIVE IMPAIRMENTS: decreased activity tolerance, increased muscle spasms, postural dysfunction, and pain.   ACTIVITY LIMITATIONS: carrying and lifting  PARTICIPATION LIMITATIONS: occupation  PERSONAL FACTORS: Time since onset of injury/illness/exacerbation and 1 comorbidity: ACDF.  are also affecting patient's functional outcome.   REHAB POTENTIAL: Excellent  CLINICAL DECISION MAKING: Stable/uncomplicated  EVALUATION COMPLEXITY: Low   GOALS:  SHORT TERM GOALS: Target date: 05/30/23  Ind with a HEP.  Goal status:  INITIAL  2.  Active bilateral cervical rotation to 65 degrees+.  Goal status: INITIAL  3.  Perform ADL's with pain not > 3/10.  Goal status: INITIAL  4.  Eliminate headaches. Goal status: INITIAL  PLAN:  PT FREQUENCY: 2x/week  PT DURATION: 4 weeks  PLANNED INTERVENTIONS: Therapeutic exercises, Therapeutic activity, Patient/Family education, Self Care, Dry Needling, Electrical stimulation, Cryotherapy, Moist heat, Ultrasound, and Manual therapy  PLAN FOR NEXT SESSION: FOTO, dry needling, STW/M, chin tucks, postural exercises.   Yasmina [place], [place], PT 05/02/2023, 2:21 PM

## 2023-05-05 ENCOUNTER — Ambulatory Visit: Payer: BC Managed Care – PPO | Admitting: Physical Therapy

## 2023-05-05 DIAGNOSIS — M62838 Other muscle spasm: Secondary | ICD-10-CM

## 2023-05-05 DIAGNOSIS — M542 Cervicalgia: Secondary | ICD-10-CM

## 2023-05-05 NOTE — Therapy (Signed)
OUTPATIENT PHYSICAL THERAPY CERVICAL EVALUATION   Patient Name: Lindsay Sanchez MRN: [id number] DOB:08-Jan-1971, 52 y.o., female Today's Date: 05/05/2023  END OF SESSION:  PT End of Session - 05/05/23 1648     Visit Number 2    Number of Visits 8    Date for PT Re-Evaluation 05/30/23    PT Start Time 0401    PT Stop Time 0458    PT Time Calculation (min) 57 min    Activity Tolerance Patient tolerated treatment well    Behavior During Therapy [facility] for tasks assessed/performed             Past Medical History:  Diagnosis Date   Anxiety    anxiety attacks are infrequent   Asthma    Chest tightness    Diabetes (HCC)    Dry mouth    patient states her dentist diagnosed iwth dry mouth-uses Xylitol tabs qhs   Macular degeneration    per patient-treated by Dr Dione Booze   Meralgia paresthetica of left side 12/12/2015   Obesity 09/27/2012   Urinary frequency    Past Surgical History:  Procedure Laterality Date   ABDOMINAL HYSTERECTOMY     precancer cells on pap smear; thinks left ovar remains   CESAREAN SECTION     FOOT SURGERY     cyst removal   HAMMERTOE RECONSTRUCTION WITH WEIL OSTEOTOMY Right 12/25/2015   Procedure: RIGHT THIRD HAMMERTOE CORRECTION, WEIL OSTEOTOMY;  Surgeon: Toni Arthurs, MD;  Location: [facility];  Service: Orthopedics;  Laterality: Right;   HAND SURGERY     HARDWARE REMOVAL Right 12/25/2015   Procedure: RIGHT FIFTH METATARSAL DEEP IMPLANT REMOVAL, EXOSTECTOMY;  Surgeon: Toni Arthurs, MD;  Location:  SURGERY CENTER;  Service: Orthopedics;  Laterality: Right;   MOUTH SURGERY     [facility] [place]--removal of cyst per patient-awaiting path results   NECK SURGERY     cervical fusion   ROOT CANAL     TONSILLECTOMY     Patient Active Problem List   Diagnosis Date Noted   Ear pain 04/04/2023   Elevated blood pressure reading 04/04/2023   Allergic rhinitis 06/18/2022   Mild intermittent asthma without complication 07/25/2020    Depression, recurrent (HCC) 07/25/2020   Morbid obesity (HCC) 03/08/2016   Meralgia paresthetica of left side 12/12/2015   Obesity 09/27/2012   Family history of coronary artery disease 09/27/2012   Controlled type 2 diabetes mellitus with complication, without long-term current use of insulin (HCC)      REFERRING PROVIDER: Nira Conn  REFERRING DIAG: Trigger point with neck pain.  THERAPY DIAG:  Cervicalgia  Other muscle spasm  Rationale for Evaluation and Treatment: Rehabilitation  ONSET DATE: December, 2023.  SUBJECTIVE:  SUBJECTIVE STATEMENT: No new complaints.  PERTINENT HISTORY:  ACDF, knee injury.  PAIN:  Are you having pain? Yes: NPRS scale: 5-8/10 Pain location: Bilateral neck. Pain description: Sharp and numb. Aggravating factors: As above. Relieving factors: As above.  PRECAUTIONS: Other: ACDF.  WEIGHT BEARING RESTRICTIONS: No  FALLS:  Has patient fallen in last 6 months? No  LIVING ENVIRONMENT: Lives with: lives with their spouse Lives in: House/apartment Has following equipment at home: None  OCCUPATION: Dog groomer.  PLOF: Independent  PATIENT GOALS: Not have this pain and move neck better.   OBJECTIVE:   POSTURE:   Forward head and rounded shoulders.  PALPATION: Tender over bilateral suboccipital musculature, right medial scapular region and bilateral UT's and Levator Scapulae muscualture with notable trigger points.   CERVICAL ROM:  Right active cervical rotation is 50 degrees and left is 60 degrees.  UPPER EXTREMITY ROM:  WNL.  UPPER EXTREMITY MMT:  Normal UE strength.  SPECIAL TESTS:  Normal UE DTR's.   TODAY'S TREATMENT:                                                                                                                               DATE:05/05/23:  Combo e'stim/US at 1.50 W/CM2 x 12 minutes to bilateral UT's f/b STW/M x 12 minutes f/b HMP and IFC at 80-150 Hz on 40% scan x 20 minutes to bilateral cervical musculature.  Normal modality response following removal of modality.   PATIENT EDUCATION:  Education details: Correct posture.  Chin tucks. Person educated: Patient Education method: Explanation Education comprehension: verbalized understanding  HOME EXERCISE PROGRAM: As above.  ASSESSMENT:  CLINICAL IMPRESSION: Patient with increased tenderness and tautness over bilateral UT's and Levator Scapulae.  Good response to STW/M.    OBJECTIVE IMPAIRMENTS: decreased activity tolerance, increased muscle spasms, postural dysfunction, and pain.   ACTIVITY LIMITATIONS: carrying and lifting  PARTICIPATION LIMITATIONS: occupation  PERSONAL FACTORS: Time since onset of injury/illness/exacerbation and 1 comorbidity: ACDF.  are also affecting patient's functional outcome.   REHAB POTENTIAL: Excellent  CLINICAL DECISION MAKING: Stable/uncomplicated  EVALUATION COMPLEXITY: Low   GOALS:  SHORT TERM GOALS: Target date: 05/30/23  Ind with a HEP.  Goal status: INITIAL  2.  Active bilateral cervical rotation to 65 degrees+.  Goal status: INITIAL  3.  Perform ADL's with pain not > 3/10.  Goal status: INITIAL  4.  Eliminate headaches. Goal status: INITIAL  PLAN:  PT FREQUENCY: 2x/week  PT DURATION: 4 weeks  PLANNED INTERVENTIONS: Therapeutic exercises, Therapeutic activity, Patient/Family education, Self Care, Dry Needling, Electrical stimulation, Cryotherapy, Moist heat, Ultrasound, and Manual therapy  PLAN FOR NEXT SESSION: FOTO, dry needling, STW/M, chin tucks, postural exercises.   Giannie Soliday, [place], PT 05/05/2023, 4:58 PM

## 2023-05-09 ENCOUNTER — Ambulatory Visit: Payer: BC Managed Care – PPO | Admitting: Physical Therapy

## 2023-05-09 DIAGNOSIS — M62838 Other muscle spasm: Secondary | ICD-10-CM | POA: Diagnosis not present

## 2023-05-09 DIAGNOSIS — M542 Cervicalgia: Secondary | ICD-10-CM

## 2023-05-09 NOTE — Therapy (Addendum)
OUTPATIENT PHYSICAL THERAPY CERVICAL TREATMENT   Patient Name: Lindsay Sanchez MRN: [id number] DOB:1971-10-23, 52 y.o., female Today's Date: 05/09/2023  END OF SESSION:  PT End of Session - 05/09/23 0841     Visit Number 3    Number of Visits 8    Date for PT Re-Evaluation 05/30/23    PT Start Time 0800    PT Stop Time 0849    PT Time Calculation (min) 49 min    Activity Tolerance Patient tolerated treatment well    Behavior During Therapy [facility] for tasks assessed/performed             Past Medical History:  Diagnosis Date   Anxiety    anxiety attacks are infrequent   Asthma    Chest tightness    Diabetes (HCC)    Dry mouth    patient states her dentist diagnosed iwth dry mouth-uses Xylitol tabs qhs   Macular degeneration    per patient-treated by Dr Dione Booze   Meralgia paresthetica of left side 12/12/2015   Obesity 09/27/2012   Urinary frequency    Past Surgical History:  Procedure Laterality Date   ABDOMINAL HYSTERECTOMY     precancer cells on pap smear; thinks left ovar remains   CESAREAN SECTION     FOOT SURGERY     cyst removal   HAMMERTOE RECONSTRUCTION WITH WEIL OSTEOTOMY Right 12/25/2015   Procedure: RIGHT THIRD HAMMERTOE CORRECTION, WEIL OSTEOTOMY;  Surgeon: Toni Arthurs, MD;  Location: [facility];  Service: Orthopedics;  Laterality: Right;   HAND SURGERY     HARDWARE REMOVAL Right 12/25/2015   Procedure: RIGHT FIFTH METATARSAL DEEP IMPLANT REMOVAL, EXOSTECTOMY;  Surgeon: Toni Arthurs, MD;  Location: [facility];  Service: Orthopedics;  Laterality: Right;   MOUTH SURGERY     [facility] [place]--removal of cyst per patient-awaiting path results   NECK SURGERY     cervical fusion   ROOT CANAL     TONSILLECTOMY     Patient Active Problem List   Diagnosis Date Noted   Ear pain 04/04/2023   Elevated blood pressure reading 04/04/2023   Allergic rhinitis 06/18/2022   Mild intermittent asthma without complication 07/25/2020    Depression, recurrent (HCC) 07/25/2020   Morbid obesity (HCC) 03/08/2016   Meralgia paresthetica of left side 12/12/2015   Obesity 09/27/2012   Family history of coronary artery disease 09/27/2012   Controlled type 2 diabetes mellitus with complication, without long-term current use of insulin (HCC)      REFERRING PROVIDER: Nira Conn  REFERRING DIAG: Trigger point with neck pain.  THERAPY DIAG:  Cervicalgia  Other muscle spasm  Rationale for Evaluation and Treatment: Rehabilitation  ONSET DATE: December, 2023.  SUBJECTIVE:  SUBJECTIVE STATEMENT: Wanting to try dry needling today to right side. PERTINENT HISTORY:  ACDF, knee injury.  PAIN:  Are you having pain? Yes: NPRS scale: 6/10 Pain location: Bilateral neck. Pain description: Sharp and numb. Aggravating factors: As above. Relieving factors: As above.  PRECAUTIONS: Other: ACDF.  WEIGHT BEARING RESTRICTIONS: No  FALLS:  Has patient fallen in last 6 months? No  LIVING ENVIRONMENT: Lives with: lives with their spouse Lives in: House/apartment Has following equipment at home: None  OCCUPATION: Dog groomer.  PLOF: Independent  PATIENT GOALS: Not have this pain and move neck better.   OBJECTIVE:   POSTURE:   Forward head and rounded shoulders.  PALPATION: Tender over bilateral suboccipital musculature, right medial scapular region and bilateral UT's and Levator Scapulae muscualture with notable trigger points.   CERVICAL ROM:  Right active cervical rotation is 50 degrees and left is 60 degrees.  UPPER EXTREMITY ROM:  WNL.  UPPER EXTREMITY MMT:  Normal UE strength.  SPECIAL TESTS:  Normal UE DTR's.   TODAY'S TREATMENT:                                                                                                                               DATE:05/09/23:  Trigger Point Dry-Needling  Treatment instructions: Expect mild to moderate muscle soreness. S/S of pneumothorax if dry needled over a lung field, and to seek immediate medical attention should they occur. Patient verbalized understanding of these instructions and education. Patient Consent Given: Yes Education handout provided: Yes Muscles treated: Right upper trap and Splenius Capitus/Cervicus Combo e'stim/US at 1.50 W/CM2 x 12 minutes to right UT f/b STW/M x 11 minutes f/b HMP and IFC at 80-150 Hz on 40% scan x 15 minutes to bilateral cervical musculature.  Normal modality response following removal of modality.   PATIENT EDUCATION:  Education details: Correct posture.  Chin tucks. Person educated: Patient Education method: Explanation Education comprehension: verbalized understanding  HOME EXERCISE PROGRAM: As above.  ASSESSMENT:  CLINICAL IMPRESSION: The patient tolerated DN to her right UT and splenius capitus/cervicus very well.  Normal modality response following removal of modality.  OBJECTIVE IMPAIRMENTS: decreased activity tolerance, increased muscle spasms, postural dysfunction, and pain.   ACTIVITY LIMITATIONS: carrying and lifting  PARTICIPATION LIMITATIONS: occupation  PERSONAL FACTORS: Time since onset of injury/illness/exacerbation and 1 comorbidity: ACDF.  are also affecting patient's functional outcome.   REHAB POTENTIAL: Excellent  CLINICAL DECISION MAKING: Stable/uncomplicated  EVALUATION COMPLEXITY: Low   GOALS:  SHORT TERM GOALS: Target date: 05/30/23  Ind with a HEP.  Goal status: INITIAL  2.  Active bilateral cervical rotation to 65 degrees+.  Goal status: INITIAL  3.  Perform ADL's with pain not > 3/10.  Goal status: INITIAL  4.  Eliminate headaches. Goal status: INITIAL  PLAN:  PT FREQUENCY: 2x/week  PT DURATION: 4 weeks  PLANNED INTERVENTIONS: Therapeutic exercises,  Therapeutic activity, Patient/Family education, Self Care, Dry Needling, Electrical stimulation, Cryotherapy, Moist heat, Ultrasound, and  Manual therapy  PLAN FOR NEXT SESSION: FOTO, dry needling, STW/M, chin tucks, postural exercises.   Tahara Ruffini, [place], PT 05/09/2023, 9:16 AM

## 2023-05-11 ENCOUNTER — Encounter: Payer: Self-pay | Admitting: Family Medicine

## 2023-05-11 NOTE — Telephone Encounter (Signed)
Can she come in sooner? I do not prescribed antibiotics without an appointment. Or if there are no openings she can go to the urgent care.

## 2023-05-13 DIAGNOSIS — H93291 Other abnormal auditory perceptions, right ear: Secondary | ICD-10-CM | POA: Diagnosis not present

## 2023-05-13 DIAGNOSIS — H9201 Otalgia, right ear: Secondary | ICD-10-CM | POA: Diagnosis not present

## 2023-05-13 DIAGNOSIS — J309 Allergic rhinitis, unspecified: Secondary | ICD-10-CM | POA: Diagnosis not present

## 2023-05-13 DIAGNOSIS — M2669 Other specified disorders of temporomandibular joint: Secondary | ICD-10-CM | POA: Diagnosis not present

## 2023-05-16 ENCOUNTER — Encounter: Payer: Self-pay | Admitting: Physical Therapy

## 2023-05-16 ENCOUNTER — Ambulatory Visit: Payer: BC Managed Care – PPO | Admitting: Physical Therapy

## 2023-05-16 DIAGNOSIS — M62838 Other muscle spasm: Secondary | ICD-10-CM

## 2023-05-16 DIAGNOSIS — M542 Cervicalgia: Secondary | ICD-10-CM | POA: Diagnosis not present

## 2023-05-16 NOTE — Therapy (Signed)
OUTPATIENT PHYSICAL THERAPY CERVICAL TREATMENT   Patient Name: Lindsay Sanchez MRN: [id number] DOB:[birth year], 52 y.o., female Today's Date: 05/16/2023  END OF SESSION:  PT End of Session - 05/16/23 0926     Visit Number 4    Number of Visits 8    Date for PT Re-Evaluation 05/30/23    PT Start Time 0805    PT Stop Time 0858    PT Time Calculation (min) 53 min    Activity Tolerance Patient tolerated treatment well    Behavior During Therapy [facility] for tasks assessed/performed             Past Medical History:  Diagnosis Date   Anxiety    anxiety attacks are infrequent   Asthma    Chest tightness    Diabetes (HCC)    Dry mouth    patient states her dentist diagnosed iwth dry mouth-uses Xylitol tabs qhs   Macular degeneration    per patient-treated by Dr Dione Booze   Meralgia paresthetica of left side 12/12/2015   Obesity 09/27/2012   Urinary frequency    Past Surgical History:  Procedure Laterality Date   ABDOMINAL HYSTERECTOMY     precancer cells on pap smear; thinks left ovar remains   CESAREAN SECTION     FOOT SURGERY     cyst removal   HAMMERTOE RECONSTRUCTION WITH WEIL OSTEOTOMY Right 12/25/2015   Procedure: RIGHT THIRD HAMMERTOE CORRECTION, WEIL OSTEOTOMY;  Surgeon: Toni Arthurs, MD;  Location: [facility];  Service: Orthopedics;  Laterality: Right;   HAND SURGERY     HARDWARE REMOVAL Right 12/25/2015   Procedure: RIGHT FIFTH METATARSAL DEEP IMPLANT REMOVAL, EXOSTECTOMY;  Surgeon: Toni Arthurs, MD;  Location: [facility];  Service: Orthopedics;  Laterality: Right;   MOUTH SURGERY     [facility] [place]--removal of cyst per patient-awaiting path results   NECK SURGERY     cervical fusion   ROOT CANAL     TONSILLECTOMY     Patient Active Problem List   Diagnosis Date Noted   Ear pain 04/04/2023   Elevated blood pressure reading 04/04/2023   Allergic rhinitis 06/18/2022   Mild intermittent asthma without complication 07/25/2020    Depression, recurrent (HCC) 07/25/2020   Morbid obesity (HCC) 03/08/2016   Meralgia paresthetica of left side 12/12/2015   Obesity 09/27/2012   Family history of coronary artery disease 09/27/2012   Controlled type 2 diabetes mellitus with complication, without long-term current use of insulin (HCC)      REFERRING PROVIDER: Nira Conn  REFERRING DIAG: Trigger point with neck pain.  THERAPY DIAG:  Cervicalgia  Other muscle spasm  Rationale for Evaluation and Treatment: Rehabilitation  ONSET DATE: December, 2023.  SUBJECTIVE:  SUBJECTIVE STATEMENT: Wanting to try dry needling today to left  side.  Right side did great and pain down to a 4.  Left is an 8 today. PERTINENT HISTORY:  ACDF, knee injury.  PAIN:  Are you having pain? Yes: NPRS scale: See above/10 Pain location: Bilateral neck. Pain description: Sharp and numb. Aggravating factors: As above. Relieving factors: As above.  PRECAUTIONS: Other: ACDF.  WEIGHT BEARING RESTRICTIONS: No  FALLS:  Has patient fallen in last 6 months? No  LIVING ENVIRONMENT: Lives with: lives with their spouse Lives in: House/apartment Has following equipment at home: None  OCCUPATION: Dog groomer.  PLOF: Independent  PATIENT GOALS: Not have this pain and move neck better.   OBJECTIVE:   POSTURE:   Forward head and rounded shoulders.  PALPATION: Tender over bilateral suboccipital musculature, right medial scapular region and bilateral UT's and Levator Scapulae muscualture with notable trigger points.   CERVICAL ROM:  Right active cervical rotation is 50 degrees and left is 60 degrees.  UPPER EXTREMITY ROM:  WNL.  UPPER EXTREMITY MMT:  Normal UE strength.  SPECIAL TESTS:  Normal UE DTR's.   TODAY'S TREATMENT:                                                                                                                               DATE:05/09/23:  Trigger Point Dry-Needling  Treatment instructions: Expect mild to moderate muscle soreness. S/S of pneumothorax if dry needled over a lung field, and to seek immediate medical attention should they occur. Patient verbalized understanding of these instructions and education. Patient Consent Given: Yes Education handout provided: Yes Muscles treated: Left upper trap. Combo e'stim/US at 1.50 W/CM2 x 12 minutes to right UT f/b STW/M x 11 minutes f/b HMP and IFC at 80-150 Hz on 40% scan x 20 minutes to bilateral cervical musculature.  Normal modality response following removal of modality.   PATIENT EDUCATION:  Education details: Correct posture.  Chin tucks. Person educated: Patient Education method: Explanation Education comprehension: verbalized understanding  HOME EXERCISE PROGRAM: As above.  ASSESSMENT:  CLINICAL IMPRESSION: Excellent response to dry needling last visit and this visit to left UT.  She felt much better after treatment.    OBJECTIVE IMPAIRMENTS: decreased activity tolerance, increased muscle spasms, postural dysfunction, and pain.   ACTIVITY LIMITATIONS: carrying and lifting  PARTICIPATION LIMITATIONS: occupation  PERSONAL FACTORS: Time since onset of injury/illness/exacerbation and 1 comorbidity: ACDF.  are also affecting patient's functional outcome.   REHAB POTENTIAL: Excellent  CLINICAL DECISION MAKING: Stable/uncomplicated  EVALUATION COMPLEXITY: Low   GOALS:  SHORT TERM GOALS: Target date: 05/30/23  Ind with a HEP.  Goal status: INITIAL  2.  Active bilateral cervical rotation to 65 degrees+.  Goal status: INITIAL  3.  Perform ADL's with pain not > 3/10.  Goal status: INITIAL  4.  Eliminate headaches. Goal status: INITIAL  PLAN:  PT FREQUENCY: 2x/week  PT DURATION: 4 weeks  PLANNED INTERVENTIONS:  Therapeutic exercises, Therapeutic activity, Patient/Family education, Self Care, Dry Needling, Electrical stimulation, Cryotherapy, Moist heat, Ultrasound, and Manual therapy  PLAN FOR NEXT SESSION: FOTO, dry needling, STW/M, chin tucks, postural exercises.   Kin Galbraith, [place], PT 05/16/2023, 9:35 AM

## 2023-05-22 ENCOUNTER — Other Ambulatory Visit: Payer: Self-pay | Admitting: Family Medicine

## 2023-05-22 DIAGNOSIS — R030 Elevated blood-pressure reading, without diagnosis of hypertension: Secondary | ICD-10-CM

## 2023-05-22 DIAGNOSIS — E118 Type 2 diabetes mellitus with unspecified complications: Secondary | ICD-10-CM

## 2023-05-23 ENCOUNTER — Ambulatory Visit: Payer: BC Managed Care – PPO | Admitting: Family Medicine

## 2023-05-23 ENCOUNTER — Encounter: Payer: Self-pay | Admitting: Family Medicine

## 2023-05-23 VITALS — BP 120/80 | HR 70 | Temp 97.5°F | Ht 67.0 in | Wt 264.0 lb

## 2023-05-23 DIAGNOSIS — R030 Elevated blood-pressure reading, without diagnosis of hypertension: Secondary | ICD-10-CM | POA: Diagnosis not present

## 2023-05-23 DIAGNOSIS — M2550 Pain in unspecified joint: Secondary | ICD-10-CM | POA: Diagnosis not present

## 2023-05-23 DIAGNOSIS — G8929 Other chronic pain: Secondary | ICD-10-CM

## 2023-05-23 DIAGNOSIS — E118 Type 2 diabetes mellitus with unspecified complications: Secondary | ICD-10-CM | POA: Diagnosis not present

## 2023-05-23 DIAGNOSIS — Z7985 Long-term (current) use of injectable non-insulin antidiabetic drugs: Secondary | ICD-10-CM

## 2023-05-23 LAB — COMPREHENSIVE METABOLIC PANEL
ALT: 15 U/L (ref 0–35)
AST: 21 U/L (ref 0–37)
Albumin: 4.4 g/dL (ref 3.5–5.2)
Alkaline Phosphatase: 68 U/L (ref 39–117)
BUN: 19 mg/dL (ref 6–23)
CO2: 28 mEq/L (ref 19–32)
Calcium: 9.5 mg/dL (ref 8.4–10.5)
Chloride: 102 mEq/L (ref 96–112)
Creatinine, Ser: 0.65 mg/dL (ref 0.40–1.20)
GFR: 99.83 mL/min (ref >60.00)
Glucose, Bld: 99 mg/dL (ref 70–99)
Potassium: 4.4 mEq/L (ref 3.5–5.1)
Sodium: 138 mEq/L (ref 135–145)
Total Bilirubin: 0.5 mg/dL (ref 0.2–1.2)
Total Protein: 7.5 g/dL (ref 6.0–8.3)

## 2023-05-23 LAB — LIPID PANEL
Cholesterol: 159 mg/dL (ref 0–200)
HDL: 80 mg/dL (ref >39.00)
LDL Cholesterol: 55 mg/dL (ref 0–99)
NonHDL: 79.22
Total CHOL/HDL Ratio: 2
Triglycerides: 119 mg/dL (ref 0.0–149.0)
VLDL: 23.8 mg/dL (ref 0.0–40.0)

## 2023-05-23 LAB — POCT GLYCOSYLATED HEMOGLOBIN (HGB A1C): Hemoglobin A1C: 5.8 % — AB (ref 4.0–5.6)

## 2023-05-23 LAB — MICROALBUMIN / CREATININE URINE RATIO
Creatinine,U: 94.7 mg/dL
Microalb Creat Ratio: 0.7 mg/g (ref 0.0–30.0)
Microalb, Ur: <0.7 mg/dL (ref 0.0–1.9)

## 2023-05-23 MED ORDER — TIRZEPATIDE 15 MG/0.5ML ~~LOC~~ SOAJ: 15.0000 mg | SUBCUTANEOUS | 5 refills | Status: AC

## 2023-05-23 MED ORDER — DICLOFENAC SODIUM 75 MG PO TBEC: 75.0000 mg | DELAYED_RELEASE_TABLET | Freq: Two times a day (BID) | ORAL | 5 refills | Status: DC

## 2023-05-23 MED ORDER — LOSARTAN POTASSIUM 25 MG PO TABS: 25.0000 mg | ORAL_TABLET | Freq: Every day | ORAL | 1 refills | Status: DC

## 2023-05-23 MED ORDER — ROSUVASTATIN CALCIUM 5 MG PO TABS: 5.0000 mg | ORAL_TABLET | Freq: Every day | ORAL | 1 refills | Status: DC

## 2023-05-23 NOTE — Progress Notes (Signed)
Established Patient Office Visit  Subjective   Patient ID: Lindsay Sanchez, female    DOB: 03-20-1971  Age: 52 y.o. MRN: [id number]  Chief Complaint  Patient presents with  . Medical Management of Chronic Issues    Pt Is here for 6 month follow up on her chronic medical issues.   DM type 2-- A1C performed in office today and is 5.8, pt is on mounjaro 15 mg weekly, she reports that it is doing well to control her sugars but she has not lost any weight. States that she is frustrated because she is exercising, eating healthy foods, reducing sugar and other things and its just not working. She has considered weight loss surgery in past but her previous insurance carrier did not cover it.   Multiple joint pain -- states this has been going on for some time, states that she has had problems with ankle and knees as well as shoulders. States that she wakes up very stiff in the morning as well. We discussed ordering additional testing and she is agreeable. States she has been to ortho in the past due to a torn meniscus in the left knee, had injection there which did not help. She is on meloxicam once daily however thought that the diclofenac worked better for her, would like to switch.   Depression-- pt weaned herself off the lexapro. States that she is feeling fine without it, states that the OCD is a little worse but overall she is doing well off the medication.    Current Outpatient Medications  Medication Instructions  . Accu-Chek FastClix Lancets MISC Use as directed  . albuterol (PROVENTIL HFA;VENTOLIN HFA) 108 (90 Base) MCG/ACT inhaler 2 puffs, Inhalation, Every 6 hours PRN  . baclofen (LIORESAL) 10 mg, Oral, 3 times daily  . Blood Glucose Monitoring Suppl (CONTOUR BLOOD GLUCOSE SYSTEM) w/Device KIT 1 strip, Does not apply, Daily  . cetirizine (ZYRTEC) 10 mg, Oral, Daily at bedtime  . ciprofloxacin-dexamethasone (CIPRODEX) OTIC suspension 4 drops, Right EAR, 2 times daily  . diclofenac  (VOLTAREN) 75 mg, Oral, 2 times daily  . estradiol (ESTRACE) 2 mg, Oral, Daily  . glucose blood (CONTOUR TEST) test strip 1 each, Other, Daily, Use as instructed  . loratadine (CLARITIN) 10 mg, Oral, Every morning  . losartan (COZAAR) 25 mg, Oral, Daily  . montelukast (SINGULAIR) 10 mg, Oral, Daily at bedtime  . Mounjaro 15 mg, Subcutaneous, Weekly  . Multiple Vitamin (MULTIVITAMIN) tablet 1 tablet, Daily  . OVER THE COUNTER MEDICATION Xylitol melts-at bedtime for dry mouth  . rosuvastatin (CRESTOR) 5 mg, Oral, Daily  . Spacer/Aero-Holding Chambers (AEROCHAMBER PLUS) inhaler Use as instructed  . tirzepatide [facility]) 15 mg, Subcutaneous, Weekly    Patient Active Problem List   Diagnosis Date Noted  . Ear pain 04/04/2023  . Elevated blood pressure reading 04/04/2023  . Allergic rhinitis 06/18/2022  . Mild intermittent asthma without complication 07/25/2020  . Depression, recurrent (HCC) 07/25/2020  . Morbid obesity (HCC) 03/08/2016  . Meralgia paresthetica of left side 12/12/2015  . Obesity 09/27/2012  . Family history of coronary artery disease 09/27/2012  . Controlled type 2 diabetes mellitus with complication, without long-term current use of insulin (HCC)       Review of Systems  All other systems reviewed and are negative.     Objective:     BP 120/80 (BP Location: Left Arm, Patient Position: Sitting, Cuff Size: Large)   Pulse 70   Temp (!) 97.5 F (36.4 C) (Oral)  Ht 5\' 7"  (1.702 m)   Wt 264 lb (119.7 kg)   SpO2 99%   BMI 41.35 kg/m  {Vitals History (Optional):23777}  Physical Exam Vitals reviewed.  Constitutional:      Appearance: Normal appearance. She is well-groomed and normal weight.  Eyes:     Conjunctiva/sclera: Conjunctivae normal.  Neck:     Thyroid: No thyromegaly.  Cardiovascular:     Rate and Rhythm: Normal rate and regular rhythm.     Pulses: Normal pulses.     Heart sounds: S1 normal and S2 normal.  Pulmonary:     Effort: Pulmonary  effort is normal.     Breath sounds: Normal breath sounds and air entry.  Abdominal:     General: Bowel sounds are normal.  Musculoskeletal:     Right lower leg: No edema.     Left lower leg: No edema.  Neurological:     Mental Status: She is alert and oriented to person, place, and time. Mental status is at baseline.     Gait: Gait is intact.  Psychiatric:        Mood and Affect: Mood and affect normal.        Speech: Speech normal.        Behavior: Behavior normal.        Judgment: Judgment normal.     Results for orders placed or performed in visit on 05/23/23  POC HgB A1c  Result Value Ref Range   Hemoglobin A1C 5.8 (A) 4.0 - 5.6 %   HbA1c POC (<> result, manual entry)     HbA1c, POC (prediabetic range)     HbA1c, POC (controlled diabetic range)      {Labs (Optional):23779}  The 10-year ASCVD risk score (Arnett DK, et al., 2019) is: 2.2%    Assessment & Plan:  Controlled type 2 diabetes mellitus with complication, without long-term current use of insulin (HCC) -     POCT glycosylated hemoglobin (Hb A1C) -     Losartan Potassium; Take 1 tablet (25 mg total) by mouth daily.  Dispense: 90 tablet; Refill: 1 -     Rosuvastatin Calcium; Take 1 tablet (5 mg total) by mouth daily.  Dispense: 90 tablet; Refill: 1 -     Comprehensive metabolic panel -     Lipid panel -     Microalbumin / creatinine urine ratio -     Tirzepatide; Inject 15 mg into the skin once a week.  Dispense: 6 mL; Refill: 5  Elevated blood pressure reading -     Losartan Potassium; Take 1 tablet (25 mg total) by mouth daily.  Dispense: 90 tablet; Refill: 1  Chronic pain of multiple joints -     Rheumatoid factor -     ANA w/Reflex -     Diclofenac Sodium; Take 1 tablet (75 mg total) by mouth 2 (two) times daily.  Dispense: 60 tablet; Refill: 5     Return in about 6 months (around 11/23/2023) for DM, HTN.    Karie Georges, MD

## 2023-05-24 ENCOUNTER — Encounter: Payer: BC Managed Care – PPO | Admitting: Physical Therapy

## 2023-05-24 LAB — ENA+DNA/DS+SJORGEN'S
ENA RNP Ab: 1.5 AI — ABNORMAL HIGH (ref 0.0–0.9)
ENA SM Ab Ser-aCnc: <0.2 AI (ref 0.0–0.9)
ENA SSA (RO) Ab: <0.2 AI (ref 0.0–0.9)
ENA SSB (LA) Ab: 1 AI — ABNORMAL HIGH (ref 0.0–0.9)
dsDNA Ab: <1 IU/mL (ref 0–9)

## 2023-05-24 LAB — ANA W/REFLEX: Anti Nuclear Antibody (ANA): POSITIVE — AB

## 2023-05-24 LAB — RHEUMATOID FACTOR: Rheumatoid fact SerPl-aCnc: <10 IU/mL (ref <14)

## 2023-05-24 NOTE — Assessment & Plan Note (Signed)
A1C under good control however pt is on the maximum dose of mounjaro and she is not losing weight. We discussed the possibility of bariatric surgery, pt states she is considering it and will check with her insurance to determine if the procedure is covered. Will obtain her annual labs today and continue all her medication as prescribed, continue mounjaro 15 mg weekly.

## 2023-05-24 NOTE — Assessment & Plan Note (Signed)
On losartan 25 mg daily, BP normal today in office.

## 2023-06-03 ENCOUNTER — Encounter: Payer: Self-pay | Admitting: Family Medicine

## 2023-06-03 DIAGNOSIS — H9201 Otalgia, right ear: Secondary | ICD-10-CM | POA: Diagnosis not present

## 2023-06-09 ENCOUNTER — Ambulatory Visit: Payer: BC Managed Care – PPO | Attending: Family Medicine | Admitting: Physical Therapy

## 2023-06-09 DIAGNOSIS — M542 Cervicalgia: Secondary | ICD-10-CM | POA: Insufficient documentation

## 2023-06-09 DIAGNOSIS — M62838 Other muscle spasm: Secondary | ICD-10-CM | POA: Diagnosis not present

## 2023-06-09 NOTE — Therapy (Signed)
OUTPATIENT PHYSICAL THERAPY CERVICAL TREATMENT   Patient Name: Lindsay Sanchez MRN: [id number] DOB:26-Dec-1970, 52 y.o., female Today's Date: 06/09/2023  END OF SESSION:  PT End of Session - 06/09/23 1644     Visit Number 5    Number of Visits 8    Date for PT Re-Evaluation 05/30/23    PT Start Time 0410    PT Stop Time 0501    PT Time Calculation (min) 51 min    Activity Tolerance Patient tolerated treatment well    Behavior During Therapy [facility] for tasks assessed/performed             Past Medical History:  Diagnosis Date   Anxiety    anxiety attacks are infrequent   Asthma    Chest tightness    Diabetes (HCC)    Dry mouth    patient states her dentist diagnosed iwth dry mouth-uses Xylitol tabs qhs   Macular degeneration    per patient-treated by Dr Dione Booze   Meralgia paresthetica of left side 12/12/2015   Obesity 09/27/2012   Urinary frequency    Past Surgical History:  Procedure Laterality Date   ABDOMINAL HYSTERECTOMY     precancer cells on pap smear; thinks left ovar remains   CESAREAN SECTION     FOOT SURGERY     cyst removal   HAMMERTOE RECONSTRUCTION WITH WEIL OSTEOTOMY Right 12/25/2015   Procedure: RIGHT THIRD HAMMERTOE CORRECTION, WEIL OSTEOTOMY;  Surgeon: Toni Arthurs, MD;  Location: [facility];  Service: Orthopedics;  Laterality: Right;   HAND SURGERY     HARDWARE REMOVAL Right 12/25/2015   Procedure: RIGHT FIFTH METATARSAL DEEP IMPLANT REMOVAL, EXOSTECTOMY;  Surgeon: Toni Arthurs, MD;  Location: [facility];  Service: Orthopedics;  Laterality: Right;   MOUTH SURGERY     [facility] [place]--removal of cyst per patient-awaiting path results   NECK SURGERY     cervical fusion   ROOT CANAL     TONSILLECTOMY     Patient Active Problem List   Diagnosis Date Noted   Ear pain 04/04/2023   Elevated blood pressure reading 04/04/2023   Allergic rhinitis 06/18/2022   Mild intermittent asthma without complication 07/25/2020    Depression, recurrent (HCC) 07/25/2020   Morbid obesity (HCC) 03/08/2016   Meralgia paresthetica of left side 12/12/2015   Obesity 09/27/2012   Family history of coronary artery disease 09/27/2012   Controlled type 2 diabetes mellitus with complication, without long-term current use of insulin (HCC)      REFERRING PROVIDER: Nira Conn  REFERRING DIAG: Trigger point with neck pain.  THERAPY DIAG:  Cervicalgia  Other muscle spasm  Rationale for Evaluation and Treatment: Rehabilitation  ONSET DATE: December, 2023.  SUBJECTIVE:  SUBJECTIVE STATEMENT:  Neck has been feeling better since receiving treatments.  C/o left shoulder pain rated at a 6/10.  Patient just got done grooming a Huskie.  PERTINENT HISTORY:  ACDF, knee injury.  PAIN:  Are you having pain? Yes: NPRS scale: See above/10 Pain location: Bilateral neck. Pain description: Sharp and numb. Aggravating factors: As above. Relieving factors: As above.  PRECAUTIONS: Other: ACDF.  WEIGHT BEARING RESTRICTIONS: No  FALLS:  Has patient fallen in last 6 months? No  LIVING ENVIRONMENT: Lives with: lives with their spouse Lives in: House/apartment Has following equipment at home: None  OCCUPATION: Dog groomer.  PLOF: Independent  PATIENT GOALS: Not have this pain and move neck better.   OBJECTIVE:   POSTURE:   Forward head and rounded shoulders.  PALPATION: Tender over bilateral suboccipital musculature, right medial scapular region and bilateral UT's and Levator Scapulae muscualture with notable trigger points.   CERVICAL ROM:  Right active cervical rotation is 50 degrees and left is 60 degrees.  UPPER EXTREMITY ROM:  WNL.  UPPER EXTREMITY MMT:  Normal UE strength.  SPECIAL TESTS:  Normal UE  DTR's.   TODAY'S TREATMENT:                                                                                                                              DATE:06/09/23:   STW/M x 23 minutes to patient's left UT/post cuff musculature f/b HMP and IFC at 80-150 Hz on 40% scan x 20 minutes to bilateral cervical musculature.  Normal modality response following removal of modality.   PATIENT EDUCATION:  Education details: Left shoulder IR/ER with yellow theraband Person educated: Patient Education method: Explanation Education comprehension: verbalized understanding, handout  HOME EXERCISE PROGRAM: As above.  ASSESSMENT:  CLINICAL IMPRESSION: Patient's left posterior cuff musculature notable for tenderness and trigger points.  Good response to STW/M.  Theraband provided to patient for left shoulder IR/ER.  OBJECTIVE IMPAIRMENTS: decreased activity tolerance, increased muscle spasms, postural dysfunction, and pain.   ACTIVITY LIMITATIONS: carrying and lifting  PARTICIPATION LIMITATIONS: occupation  PERSONAL FACTORS: Time since onset of injury/illness/exacerbation and 1 comorbidity: ACDF.  are also affecting patient's functional outcome.   REHAB POTENTIAL: Excellent  CLINICAL DECISION MAKING: Stable/uncomplicated  EVALUATION COMPLEXITY: Low   GOALS:  SHORT TERM GOALS: Target date: 05/30/23  Ind with a HEP.  Goal status: INITIAL  2.  Active bilateral cervical rotation to 65 degrees+.  Goal status: INITIAL  3.  Perform ADL's with pain not > 3/10.  Goal status: INITIAL  4.  Eliminate headaches. Goal status: INITIAL  PLAN:  PT FREQUENCY: 2x/week  PT DURATION: 4 weeks  PLANNED INTERVENTIONS: Therapeutic exercises, Therapeutic activity, Patient/Family education, Self Care, Dry Needling, Electrical stimulation, Cryotherapy, Moist heat, Ultrasound, and Manual therapy  PLAN FOR NEXT SESSION: FOTO, dry needling, STW/M, chin tucks, postural exercises.   Marcela Alatorre, [place],  PT 06/09/2023, 5:45 PM

## 2023-06-13 ENCOUNTER — Encounter: Payer: Self-pay | Admitting: Family Medicine

## 2023-06-14 NOTE — Progress Notes (Deleted)
Cardiology Office Note   Date:  06/14/2023   ID:  Lindsay Sanchez, DOB [birth year], MRN [id number]  PCP:  Lindsay Georges, MD  Cardiologist:    [place], MD   No chief complaint on file.     History of Present Illness: Lindsay Sanchez is a 52 y.o. female who is seen for follow up. Last seen in Jan 2022.  She has a history of DM type 2, morbid obesity, and family history of CAD. Father died at age 79 with MI and 2 cousins died at 56 and 30 respectively. She denies any symptoms of chest pain, dyspnea, or palpitations except when she has asthma attacks. She was evaluated with a stress Echo in 2013 that was normal. She was seen by Lindsay Course, PA-C, in 12/2016 at which time she reported 3 episodes of chest pain that she described as a pressure that lasted for about 4 minutes before resolving without medications.  Lexiscan Myoview was ordered and showed normal wall motion and systolic function with no evidence of ischemia. Lower extremities dopplers/ABIs were also ordered and were normal. She was last seen in Dec 2020 and doing well having lost 100 lbs with a Keto diet. She has gained some weight back but is working on it again. She has been started on low dose Crestor, losartan and semiglutide.  She only notes chest tightness with inspiration when she is under stress. No exertional symptoms and stays active.     Past Medical History:  Diagnosis Date   Anxiety    anxiety attacks are infrequent   Asthma    Chest tightness    Diabetes (HCC)    Dry mouth    patient states her dentist diagnosed iwth dry mouth-uses Xylitol tabs qhs   Macular degeneration    per patient-treated by Dr Lindsay Sanchez   Meralgia paresthetica of left side 12/12/2015   Obesity 09/27/2012   Urinary frequency     Past Surgical History:  Procedure Laterality Date   ABDOMINAL HYSTERECTOMY     precancer cells on pap smear; thinks left ovar remains   CESAREAN SECTION     FOOT SURGERY     cyst removal   HAMMERTOE  RECONSTRUCTION WITH WEIL OSTEOTOMY Right 12/25/2015   Procedure: RIGHT THIRD HAMMERTOE CORRECTION, WEIL OSTEOTOMY;  Surgeon: Toni Arthurs, MD;  Location: [facility];  Service: Orthopedics;  Laterality: Right;   HAND SURGERY     HARDWARE REMOVAL Right 12/25/2015   Procedure: RIGHT FIFTH METATARSAL DEEP IMPLANT REMOVAL, EXOSTECTOMY;  Surgeon: Toni Arthurs, MD;  Location: [facility];  Service: Orthopedics;  Laterality: Right;   MOUTH SURGERY     [facility]--removal of cyst per patient-awaiting path results   NECK SURGERY     cervical fusion   ROOT CANAL     TONSILLECTOMY       Current Outpatient Medications  Medication Sig Dispense Refill   Accu-Chek FastClix Lancets MISC Use as directed 102 each 3   albuterol (PROVENTIL HFA;VENTOLIN HFA) 108 (90 Base) MCG/ACT inhaler Inhale 2 puffs into the lungs every 6 (six) hours as needed. 1 Inhaler 0   baclofen (LIORESAL) 10 MG tablet Take 1 tablet (10 mg total) by mouth 3 (three) times daily. 30 each 0   Blood Glucose Monitoring Suppl (CONTOUR BLOOD GLUCOSE SYSTEM) w/Device KIT 1 strip by Does not apply route daily. 1 kit 0   cetirizine (ZYRTEC) 10 MG tablet Take 10 mg by mouth at bedtime.  ciprofloxacin-dexamethasone (CIPRODEX) OTIC suspension Place 4 drops into the right ear 2 (two) times daily. 7.5 mL 0   diclofenac (VOLTAREN) 75 MG EC tablet Take 1 tablet (75 mg total) by mouth 2 (two) times daily. 60 tablet 5   estradiol (ESTRACE) 2 MG tablet Take 1 tablet (2 mg total) by mouth daily. 90 tablet 1   glucose blood (CONTOUR TEST) test strip 1 each by Other route daily. Use as instructed 100 each 3   loratadine (CLARITIN) 10 MG tablet Take 10 mg by mouth every morning.     losartan (COZAAR) 25 MG tablet Take 1 tablet (25 mg total) by mouth daily. 90 tablet 1   montelukast (SINGULAIR) 10 MG tablet Take 1 tablet (10 mg total) by mouth at bedtime. 90 tablet 1   MOUNJARO 15 MG/0.5ML Pen Inject 15 mg into the skin  once a week.     Multiple Vitamin (MULTIVITAMIN) tablet Take 1 tablet by mouth daily.     OVER THE COUNTER MEDICATION Xylitol melts-at bedtime for dry mouth     rosuvastatin (CRESTOR) 5 MG tablet Take 1 tablet (5 mg total) by mouth daily. 90 tablet 1   Spacer/Aero-Holding Chambers (AEROCHAMBER PLUS) inhaler Use as instructed 1 each 0   tirzepatide (MOUNJARO) 15 MG/0.5ML Pen Inject 15 mg into the skin once a week. 6 mL 5   No current facility-administered medications for this visit.    Allergies:   Bee venom, Actos [pioglitazone], Jardiance [empagliflozin], Metformin and related, Other, and Adhesive [tape]    Social History:  The patient  reports that she has never smoked. She has never used smokeless tobacco. She reports current alcohol use. She reports that she does not use drugs.   Family History:  The patient's family history includes Arrhythmia in her mother; Breast cancer in her cousin, maternal aunt, and maternal aunt; Breast cancer (age of onset: 47) in her mother; Cancer in her maternal grandfather; Heart attack in her brother and maternal grandfather; Heart attack (age of onset: 17) in her father; Heart disease in her paternal grandmother; Ovarian cancer in her mother; Sudden Cardiac Death in her brother.    ROS:  Please see the history of present illness.   Otherwise, review of systems are positive for none.   All other systems are reviewed and negative.    PHYSICAL EXAM: VS:  There were no vitals taken for this visit. , BMI There is no height or weight on file to calculate BMI. GEN: Well nourished, well developed, in no acute distress  HEENT: normal  Neck: no JVD, carotid bruits, or masses Cardiac: RRR; no murmurs, rubs, or gallops,no edema  Respiratory:  clear to auscultation bilaterally, normal work of breathing GI: soft, nontender, nondistended, + BS MS: no deformity or atrophy  Skin: warm and dry, no rash Neuro:  Strength and sensation are intact Psych: euthymic mood,  full affect   EKG:  EKG is ordered today. The ekg ordered today demonstrates NSR with normal Ecg. I have personally reviewed and interpreted this study.    Recent Labs: 05/23/2023: ALT 15; BUN 19; Creatinine, Ser 0.65; Potassium 4.4; Sodium 138    Lipid Panel    Component Value Date/Time   CHOL 159 05/23/2023 1425   TRIG 119.0 05/23/2023 1425   HDL 80.00 05/23/2023 1425   CHOLHDL 2 05/23/2023 1425   VLDL 23.8 05/23/2023 1425   LDLCALC 55 05/23/2023 1425   LDLCALC 83 10/27/2020 0823      Wt Readings from Last 3  Encounters:  05/23/23 264 lb (119.7 kg)  04/04/23 259 lb 3.2 oz (117.6 kg)  10/06/22 263 lb (119.3 kg)      Other studies Reviewed: Additional studies/ records that were reviewed today include:   Myoview 01/19/2017: The left ventricular ejection fraction is normal (55-65%). Nuclear stress EF: 60%. Blood pressure demonstrated a normal response to exercise. There was no ST segment deviation noted during stress. No T wave inversion was noted during stress. This is a low risk study.   No reversible ischemia. LVEF 60% with normal wall motion. This is a low risk study.  ASSESSMENT AND PLAN:  1.  HTN well controlled  2. DM type 2 - last A1c 9.9%- per primary care.  3. HLD. Now on low dose Crestor. LDL went from 131 to 83. Ideally would like to see below 70. Will see how she responds to better BS control and weight loss.  4. Family history of early CAD.  5. Atypical chest pain. Negative Myoview in 2018. No concerning symptoms currently.   Current medicines are reviewed at length with the patient today.  The patient does not have concerns regarding medicines.  The following changes have been made:  no change  Labs/ tests ordered today include:  No orders of the defined types were placed in this encounter.    Disposition:   FU with me in 1 year  Signed,  [place], MD  06/14/2023 4:50 PM    [facility] [address]  [place], [place], [address] Phone [phone], Fax [phone]

## 2023-06-15 ENCOUNTER — Telehealth: Payer: Self-pay | Admitting: Family Medicine

## 2023-06-15 NOTE — Telephone Encounter (Signed)
Says referral [id number] needs to go to rheumatology

## 2023-06-16 ENCOUNTER — Ambulatory Visit: Payer: BC Managed Care – PPO | Attending: Cardiology | Admitting: Cardiology

## 2023-06-17 NOTE — Telephone Encounter (Signed)
Noted  

## 2023-06-20 ENCOUNTER — Ambulatory Visit: Payer: BC Managed Care – PPO | Admitting: Physical Therapy

## 2023-06-20 DIAGNOSIS — M542 Cervicalgia: Secondary | ICD-10-CM

## 2023-06-20 DIAGNOSIS — M62838 Other muscle spasm: Secondary | ICD-10-CM | POA: Diagnosis not present

## 2023-06-20 NOTE — Therapy (Signed)
OUTPATIENT PHYSICAL THERAPY CERVICAL TREATMENT   Patient Name: Lindsay Sanchez MRN: [id number] DOB:1971-08-10, 52 y.o., female Today's Date: 06/20/2023  END OF SESSION:  PT End of Session - 06/20/23 0909     Visit Number 6    Number of Visits 8    Date for PT Re-Evaluation 05/30/23    PT Start Time 0805    PT Stop Time 0857    PT Time Calculation (min) 52 min    Activity Tolerance Patient tolerated treatment well    Behavior During Therapy [facility] for tasks assessed/performed             Past Medical History:  Diagnosis Date   Anxiety    anxiety attacks are infrequent   Asthma    Chest tightness    Diabetes (HCC)    Dry mouth    patient states her dentist diagnosed iwth dry mouth-uses Xylitol tabs qhs   Macular degeneration    per patient-treated by Dr Dione Booze   Meralgia paresthetica of left side 12/12/2015   Obesity 09/27/2012   Urinary frequency    Past Surgical History:  Procedure Laterality Date   ABDOMINAL HYSTERECTOMY     precancer cells on pap smear; thinks left ovar remains   CESAREAN SECTION     FOOT SURGERY     cyst removal   HAMMERTOE RECONSTRUCTION WITH WEIL OSTEOTOMY Right 12/25/2015   Procedure: RIGHT THIRD HAMMERTOE CORRECTION, WEIL OSTEOTOMY;  Surgeon: Toni Arthurs, MD;  Location: [facility];  Service: Orthopedics;  Laterality: Right;   HAND SURGERY     HARDWARE REMOVAL Right 12/25/2015   Procedure: RIGHT FIFTH METATARSAL DEEP IMPLANT REMOVAL, EXOSTECTOMY;  Surgeon: Toni Arthurs, MD;  Location: [facility];  Service: Orthopedics;  Laterality: Right;   MOUTH SURGERY     [facility] [place]--removal of cyst per patient-awaiting path results   NECK SURGERY     cervical fusion   ROOT CANAL     TONSILLECTOMY     Patient Active Problem List   Diagnosis Date Noted   Ear pain 04/04/2023   Elevated blood pressure reading 04/04/2023   Allergic rhinitis 06/18/2022   Mild intermittent asthma without complication 07/25/2020    Depression, recurrent (HCC) 07/25/2020   Morbid obesity (HCC) 03/08/2016   Meralgia paresthetica of left side 12/12/2015   Obesity 09/27/2012   Family history of coronary artery disease 09/27/2012   Controlled type 2 diabetes mellitus with complication, without long-term current use of insulin (HCC)      REFERRING PROVIDER: Nira Conn  REFERRING DIAG: Trigger point with neck pain.  THERAPY DIAG:  Cervicalgia  Other muscle spasm  Rationale for Evaluation and Treatment: Rehabilitation  ONSET DATE: December, 2023.  SUBJECTIVE:  SUBJECTIVE STATEMENT:  Right side doing good but left scapular region pain rated at a 8. PERTINENT HISTORY:  ACDF, knee injury.  PAIN:  Are you having pain? Yes: NPRS scale: 8/10 Pain location: Bilateral neck. Pain description: Sharp and numb. Aggravating factors: As above. Relieving factors: As above.  PRECAUTIONS: Other: ACDF.  WEIGHT BEARING RESTRICTIONS: No  FALLS:  Has patient fallen in last 6 months? No  LIVING ENVIRONMENT: Lives with: lives with their spouse Lives in: House/apartment Has following equipment at home: None  OCCUPATION: Dog groomer.  PLOF: Independent  PATIENT GOALS: Not have this pain and move neck better.   OBJECTIVE:   POSTURE:   Forward head and rounded shoulders.  PALPATION: Tender over bilateral suboccipital musculature, right medial scapular region and bilateral UT's and Levator Scapulae muscualture with notable trigger points.   CERVICAL ROM:  Right active cervical rotation is 50 degrees and left is 60 degrees.  UPPER EXTREMITY ROM:  WNL.  UPPER EXTREMITY MMT:  Normal UE strength.  SPECIAL TESTS:  Normal UE DTR's.   TODAY'S TREATMENT:                                                                                                                               DATE:06/20/23:   Combo e'stim/US at 1.50 x 12 minutes to patients left mid to lower scapular region x 12 minutes f/b STW/M x 11 minutes to patient's left UT/post cuff musculature f/b HMP and IFC at 80-150 Hz on 40% scan x 15 minutes to bilateral cervical musculature.  Normal modality response following removal of modality.   PATIENT EDUCATION:  Education details: Left shoulder IR/ER with yellow theraband Person educated: Patient Education method: Explanation Education comprehension: verbalized understanding, handout  HOME EXERCISE PROGRAM: As above.  ASSESSMENT:  CLINICAL IMPRESSION: Pain and trigger point in left lower scapular region rated at an 8/10 today.  She did well with treatment today and felt better following.  OBJECTIVE IMPAIRMENTS: decreased activity tolerance, increased muscle spasms, postural dysfunction, and pain.   ACTIVITY LIMITATIONS: carrying and lifting  PARTICIPATION LIMITATIONS: occupation  PERSONAL FACTORS: Time since onset of injury/illness/exacerbation and 1 comorbidity: ACDF.  are also affecting patient's functional outcome.   REHAB POTENTIAL: Excellent  CLINICAL DECISION MAKING: Stable/uncomplicated  EVALUATION COMPLEXITY: Low   GOALS:  SHORT TERM GOALS: Target date: 05/30/23  Ind with a HEP.  Goal status: INITIAL  2.  Active bilateral cervical rotation to 65 degrees+.  Goal status: INITIAL  3.  Perform ADL's with pain not > 3/10.  Goal status: INITIAL  4.  Eliminate headaches. Goal status: INITIAL  PLAN:  PT FREQUENCY: 2x/week  PT DURATION: 4 weeks  PLANNED INTERVENTIONS: Therapeutic exercises, Therapeutic activity, Patient/Family education, Self Care, Dry Needling, Electrical stimulation, Cryotherapy, Moist heat, Ultrasound, and Manual therapy  PLAN FOR NEXT SESSION: FOTO, dry needling, STW/M, chin tucks, postural exercises.   Suhaas Agena, [place], PT 06/20/2023, 9:12  AM

## 2023-07-01 ENCOUNTER — Encounter: Payer: Self-pay | Admitting: Family Medicine

## 2023-07-04 ENCOUNTER — Ambulatory Visit: Payer: BC Managed Care – PPO | Attending: Family Medicine | Admitting: Physical Therapy

## 2023-07-04 DIAGNOSIS — M62838 Other muscle spasm: Secondary | ICD-10-CM | POA: Insufficient documentation

## 2023-07-04 DIAGNOSIS — M542 Cervicalgia: Secondary | ICD-10-CM | POA: Diagnosis not present

## 2023-07-04 NOTE — Therapy (Signed)
OUTPATIENT PHYSICAL THERAPY CERVICAL TREATMENT   Patient Name: Lindsay Sanchez MRN: [id number] DOB:04/25/1971, 52 y.o., female Today's Date: 07/04/2023  END OF SESSION:  PT End of Session - 07/04/23 0852     Visit Number 7    Number of Visits 8    Date for PT Re-Evaluation 07/04/23    PT Start Time 0805    PT Stop Time 0856    PT Time Calculation (min) 51 min    Activity Tolerance Patient tolerated treatment well    Behavior During Therapy [facility] for tasks assessed/performed             Past Medical History:  Diagnosis Date   Anxiety    anxiety attacks are infrequent   Asthma    Chest tightness    Diabetes (HCC)    Dry mouth    patient states her dentist diagnosed iwth dry mouth-uses Xylitol tabs qhs   Macular degeneration    per patient-treated by Dr Dione Booze   Meralgia paresthetica of left side 12/12/2015   Obesity 09/27/2012   Urinary frequency    Past Surgical History:  Procedure Laterality Date   ABDOMINAL HYSTERECTOMY     precancer cells on pap smear; thinks left ovar remains   CESAREAN SECTION     FOOT SURGERY     cyst removal   HAMMERTOE RECONSTRUCTION WITH WEIL OSTEOTOMY Right 12/25/2015   Procedure: RIGHT THIRD HAMMERTOE CORRECTION, WEIL OSTEOTOMY;  Surgeon: Toni Arthurs, MD;  Location: [facility];  Service: Orthopedics;  Laterality: Right;   HAND SURGERY     HARDWARE REMOVAL Right 12/25/2015   Procedure: RIGHT FIFTH METATARSAL DEEP IMPLANT REMOVAL, EXOSTECTOMY;  Surgeon: Toni Arthurs, MD;  Location: [facility];  Service: Orthopedics;  Laterality: Right;   MOUTH SURGERY     [facility] [place]--removal of cyst per patient-awaiting path results   NECK SURGERY     cervical fusion   ROOT CANAL     TONSILLECTOMY     Patient Active Problem List   Diagnosis Date Noted   Ear pain 04/04/2023   Elevated blood pressure reading 04/04/2023   Allergic rhinitis 06/18/2022   Mild intermittent asthma without complication 07/25/2020    Depression, recurrent (HCC) 07/25/2020   Morbid obesity (HCC) 03/08/2016   Meralgia paresthetica of left side 12/12/2015   Obesity 09/27/2012   Family history of coronary artery disease 09/27/2012   Controlled type 2 diabetes mellitus with complication, without long-term current use of insulin (HCC)      REFERRING PROVIDER: Nira Conn  REFERRING DIAG: Trigger point with neck pain.  THERAPY DIAG:  Cervicalgia  Other muscle spasm  Rationale for Evaluation and Treatment: Rehabilitation  ONSET DATE: December, 2023.  SUBJECTIVE:  SUBJECTIVE STATEMENT:  Right side doing good. Left hurts a lot with pain into left UE when lifting (ie:  grandchild). PERTINENT HISTORY:  ACDF, knee injury.  PAIN:  Are you having pain? Yes: NPRS scale: 8+/10 Pain location: Bilateral neck. Pain description: Sharp and numb. Aggravating factors: As above. Relieving factors: As above.  PRECAUTIONS: Other: ACDF.  WEIGHT BEARING RESTRICTIONS: No  FALLS:  Has patient fallen in last 6 months? No  LIVING ENVIRONMENT: Lives with: lives with their spouse Lives in: House/apartment Has following equipment at home: None  OCCUPATION: Dog groomer.  PLOF: Independent  PATIENT GOALS: Not have this pain and move neck better.   OBJECTIVE:   POSTURE:   Forward head and rounded shoulders.  PALPATION: Tender over bilateral suboccipital musculature, right medial scapular region and bilateral UT's and Levator Scapulae muscualture with notable trigger points.   CERVICAL ROM:  Right active cervical rotation is 50 degrees and left is 60 degrees.  UPPER EXTREMITY ROM:  WNL.  UPPER EXTREMITY MMT:  Normal UE strength.  SPECIAL TESTS:  Normal UE DTR's.   TODAY'S TREATMENT:                                                                                                                               DATE:07/04/23:   In prone:  Combo e'stim/US at 1.50 x 12 minutes to patients left mid scapular/posterior cuff region x 12 minutes f/b DN to left infraspinatus f/b  STW/M x 11 minutes to patient's left UT/post cuff musculature f/b HMP and IFC at 80-150 Hz on 40% scan x 15 minutes to bilateral cervical musculature.  Normal modality response following removal of modality.   PATIENT EDUCATION:  Education details: Left shoulder IR/ER with yellow theraband Person educated: Patient Education method: Explanation Education comprehension: verbalized understanding, handout  HOME EXERCISE PROGRAM: As above.  ASSESSMENT:  CLINICAL IMPRESSION: Pain and trigger point in left mid scapular and posterior cuff region rated.    She did well with treatment today and felt better following.  OBJECTIVE IMPAIRMENTS: decreased activity tolerance, increased muscle spasms, postural dysfunction, and pain.   ACTIVITY LIMITATIONS: carrying and lifting  PARTICIPATION LIMITATIONS: occupation  PERSONAL FACTORS: Time since onset of injury/illness/exacerbation and 1 comorbidity: ACDF.  are also affecting patient's functional outcome.   REHAB POTENTIAL: Excellent  CLINICAL DECISION MAKING: Stable/uncomplicated  EVALUATION COMPLEXITY: Low   GOALS:  SHORT TERM GOALS: Target date: 05/30/23  Ind with a HEP.  Goal status: INITIAL  2.  Active bilateral cervical rotation to 65 degrees+.  Goal status: INITIAL  3.  Perform ADL's with pain not > 3/10.  Goal status: INITIAL  4.  Eliminate headaches. Goal status: INITIAL  PLAN:  PT FREQUENCY: 2x/week  PT DURATION: 4 weeks  PLANNED INTERVENTIONS: Therapeutic exercises, Therapeutic activity, Patient/Family education, Self Care, Dry Needling, Electrical stimulation, Cryotherapy, Moist heat, Ultrasound, and Manual therapy  PLAN FOR NEXT SESSION: FOTO, dry needling,  STW/M, chin tucks, postural exercises.  Everett Ehrler, [place], PT 07/04/2023, 9:21 AM

## 2023-07-05 ENCOUNTER — Other Ambulatory Visit: Payer: Self-pay | Admitting: Family Medicine

## 2023-07-05 DIAGNOSIS — E118 Type 2 diabetes mellitus with unspecified complications: Secondary | ICD-10-CM

## 2023-07-09 ENCOUNTER — Telehealth: Payer: BC Managed Care – PPO | Admitting: Nurse Practitioner

## 2023-07-09 DIAGNOSIS — J029 Acute pharyngitis, unspecified: Secondary | ICD-10-CM | POA: Diagnosis not present

## 2023-07-09 MED ORDER — AMOXICILLIN-POT CLAVULANATE 875-125 MG PO TABS: 1.0000 | ORAL_TABLET | Freq: Two times a day (BID) | ORAL | 0 refills | Status: DC

## 2023-07-09 NOTE — Patient Instructions (Signed)
Lindsay Sanchez, thank you for joining Bennie Pierini, FNP for today's virtual visit.  While this provider is not your primary care provider (PCP), if your PCP is located in our provider database this encounter information will be shared with them immediately following your visit.   A [facility] MyChart account gives you access to today's visit and all your visits, tests, and labs performed at [facility] " click here if you don't have a [facility] MyChart account or go to [email or web address]  Consent: (Patient) Lindsay Sanchez provided verbal consent for this virtual visit at the beginning of the encounter.  Current Medications:  Current Outpatient Medications:    amoxicillin-clavulanate (AUGMENTIN) 875-125 MG tablet, Take 1 tablet by mouth 2 (two) times daily., Disp: 14 tablet, Rfl: 0   Accu-Chek FastClix Lancets MISC, Use as directed, Disp: 102 each, Rfl: 3   albuterol (PROVENTIL HFA;VENTOLIN HFA) 108 (90 Base) MCG/ACT inhaler, Inhale 2 puffs into the lungs every 6 (six) hours as needed., Disp: 1 Inhaler, Rfl: 0   baclofen (LIORESAL) 10 MG tablet, Take 1 tablet (10 mg total) by mouth 3 (three) times daily., Disp: 30 each, Rfl: 0   Blood Glucose Monitoring Suppl (CONTOUR BLOOD GLUCOSE SYSTEM) w/Device KIT, 1 strip by Does not apply route daily., Disp: 1 kit, Rfl: 0   cetirizine (ZYRTEC) 10 MG tablet, Take 10 mg by mouth at bedtime., Disp: , Rfl:    ciprofloxacin-dexamethasone (CIPRODEX) OTIC suspension, Place 4 drops into the right ear 2 (two) times daily., Disp: 7.5 mL, Rfl: 0   diclofenac (VOLTAREN) 75 MG EC tablet, Take 1 tablet (75 mg total) by mouth 2 (two) times daily., Disp: 60 tablet, Rfl: 5   estradiol (ESTRACE) 2 MG tablet, Take 1 tablet (2 mg total) by mouth daily., Disp: 90 tablet, Rfl: 1   glucose blood (ACCU-CHEK GUIDE) test strip, USE TO CHECK BLOOD SUGAR ONCE DAILY IN THE MORNING, Disp: 100 each, Rfl: 3   loratadine (CLARITIN) 10 MG tablet, Take 10  mg by mouth every morning., Disp: , Rfl:    losartan (COZAAR) 25 MG tablet, Take 1 tablet (25 mg total) by mouth daily., Disp: 90 tablet, Rfl: 1   montelukast (SINGULAIR) 10 MG tablet, Take 1 tablet (10 mg total) by mouth at bedtime., Disp: 90 tablet, Rfl: 1   MOUNJARO 15 MG/0.5ML Pen, Inject 15 mg into the skin once a week., Disp: , Rfl:    Multiple Vitamin (MULTIVITAMIN) tablet, Take 1 tablet by mouth daily., Disp: , Rfl:    OVER THE COUNTER MEDICATION, Xylitol melts-at bedtime for dry mouth, Disp: , Rfl:    rosuvastatin (CRESTOR) 5 MG tablet, Take 1 tablet (5 mg total) by mouth daily., Disp: 90 tablet, Rfl: 1   Spacer/Aero-Holding Chambers (AEROCHAMBER PLUS) inhaler, Use as instructed, Disp: 1 each, Rfl: 0   tirzepatide (MOUNJARO) 15 MG/0.5ML Pen, Inject 15 mg into the skin once a week., Disp: 6 mL, Rfl: 5   Medications ordered in this encounter:  Meds ordered this encounter  Medications   amoxicillin-clavulanate (AUGMENTIN) 875-125 MG tablet    Sig: Take 1 tablet by mouth 2 (two) times daily.    Dispense:  14 tablet    Refill:  0    Order Specific Question:   Supervising Provider    Answer:   Merrilee Jansky [id number]     *If you need refills on other medications prior to your next appointment, please contact your pharmacy*  Follow-Up: Call back or seek an in-person evaluation  if the symptoms worsen or if the condition fails to improve as anticipated.  [facility] [phone]  Other Instructions Force fluids Motrin or tylenol OTC OTC decongestant Throat lozenges if help New toothbrush in 3 days    If you have been instructed to have an in-person evaluation today at a local Urgent Care facility, please use the link below. It will take you to a list of all of our available [facility], including address, phone number and hours of operation. Please do not delay care.  [facility]  If you or a family member do not have a primary care  provider, use the link below to schedule a visit and establish care. When you choose a [facility] primary care physician or advanced practice provider, you gain a long-term partner in health. Find a Primary Care Provider  Learn more about [facility]'s in-office and virtual care options:  - Get Care Now

## 2023-07-09 NOTE — Progress Notes (Signed)
Virtual Visit Consent   Lindsay Sanchez, you are scheduled for a virtual visit with Mary-Margaret Daphine Deutscher, FNP, a [facility] provider, today.     Just as with appointments in the office, your consent must be obtained to participate.  Your consent will be active for this visit and any virtual visit you may have with one of our providers in the next 365 days.     If you have a MyChart account, a copy of this consent can be sent to you electronically.  All virtual visits are billed to your insurance company just like a traditional visit in the office.    As this is a virtual visit, video technology does not allow for your provider to perform a traditional examination.  This may limit your provider's ability to fully assess your condition.  If your provider identifies any concerns that need to be evaluated in person or the need to arrange testing (such as labs, EKG, etc.), we will make arrangements to do so.     Although advances in technology are sophisticated, we cannot ensure that it will always work on either your end or our end.  If the connection with a video visit is poor, the visit may have to be switched to a telephone visit.  With either a video or telephone visit, we are not always able to ensure that we have a secure connection.     I need to obtain your verbal consent now.   Are you willing to proceed with your visit today? YES   Lindsay Sanchez has provided verbal consent on 07/09/2023 for a virtual visit (video or telephone).   Mary-Margaret Daphine Deutscher, FNP   Date: 07/09/2023 8:41 AM   Virtual Visit via Video Note   I, Mary-Margaret Daphine Deutscher, connected with Lindsay Sanchez ([id number], May 10, 1971) on 07/09/23 at  8:45 AM EDT by a video-enabled telemedicine application and verified that I am speaking with the correct person using two identifiers.  Location: Patient: Virtual Visit Location Patient: Home Provider: Virtual Visit Location Provider: Mobile   I discussed the limitations  of evaluation and management by telemedicine and the availability of in person appointments. The patient expressed understanding and agreed to proceed.    History of Present Illness: Lindsay Sanchez is a 52 y.o. who identifies as a female who was assigned female at birth, and is being seen today for sore throat.  HPI: Sore Throat  This is a new problem. The current episode started today. The problem has been gradually worsening. Neither side of throat is experiencing more pain than the other. The maximum temperature recorded prior to her arrival was 101 - 101.9 F. The pain is at a severity of 10/10. The pain is moderate. Associated symptoms include swollen glands and trouble swallowing. She has had no exposure to strep or mono. She has tried acetaminophen for the symptoms. The treatment provided mild relief.    Review of Systems  HENT:  Positive for trouble swallowing.     Problems:  Patient Active Problem List   Diagnosis Date Noted   Ear pain 04/04/2023   Elevated blood pressure reading 04/04/2023   Allergic rhinitis 06/18/2022   Mild intermittent asthma without complication 07/25/2020   Depression, recurrent (HCC) 07/25/2020   Morbid obesity (HCC) 03/08/2016   Meralgia paresthetica of left side 12/12/2015   Obesity 09/27/2012   Family history of coronary artery disease 09/27/2012   Controlled type 2 diabetes mellitus with complication, without long-term current use of insulin (  HCC)     Allergies:  Allergies  Allergen Reactions   Bee Venom Anaphylaxis   Actos [Pioglitazone]     Itching everywhere   Jardiance [Empagliflozin]     Excessive urination   Metformin And Related     Diarrhea, intermittent. Leg numbness.   Other     Ragweed and Pollen   Adhesive [Tape] Rash    And itching (when left on for extended time)   Medications:  Current Outpatient Medications:    Accu-Chek FastClix Lancets MISC, Use as directed, Disp: 102 each, Rfl: 3   albuterol (PROVENTIL HFA;VENTOLIN  HFA) 108 (90 Base) MCG/ACT inhaler, Inhale 2 puffs into the lungs every 6 (six) hours as needed., Disp: 1 Inhaler, Rfl: 0   baclofen (LIORESAL) 10 MG tablet, Take 1 tablet (10 mg total) by mouth 3 (three) times daily., Disp: 30 each, Rfl: 0   Blood Glucose Monitoring Suppl (CONTOUR BLOOD GLUCOSE SYSTEM) w/Device KIT, 1 strip by Does not apply route daily., Disp: 1 kit, Rfl: 0   cetirizine (ZYRTEC) 10 MG tablet, Take 10 mg by mouth at bedtime., Disp: , Rfl:    ciprofloxacin-dexamethasone (CIPRODEX) OTIC suspension, Place 4 drops into the right ear 2 (two) times daily., Disp: 7.5 mL, Rfl: 0   diclofenac (VOLTAREN) 75 MG EC tablet, Take 1 tablet (75 mg total) by mouth 2 (two) times daily., Disp: 60 tablet, Rfl: 5   estradiol (ESTRACE) 2 MG tablet, Take 1 tablet (2 mg total) by mouth daily., Disp: 90 tablet, Rfl: 1   glucose blood (ACCU-CHEK GUIDE) test strip, USE TO CHECK BLOOD SUGAR ONCE DAILY IN THE MORNING, Disp: 100 each, Rfl: 3   loratadine (CLARITIN) 10 MG tablet, Take 10 mg by mouth every morning., Disp: , Rfl:    losartan (COZAAR) 25 MG tablet, Take 1 tablet (25 mg total) by mouth daily., Disp: 90 tablet, Rfl: 1   montelukast (SINGULAIR) 10 MG tablet, Take 1 tablet (10 mg total) by mouth at bedtime., Disp: 90 tablet, Rfl: 1   MOUNJARO 15 MG/0.5ML Pen, Inject 15 mg into the skin once a week., Disp: , Rfl:    Multiple Vitamin (MULTIVITAMIN) tablet, Take 1 tablet by mouth daily., Disp: , Rfl:    OVER THE COUNTER MEDICATION, Xylitol melts-at bedtime for dry mouth, Disp: , Rfl:    rosuvastatin (CRESTOR) 5 MG tablet, Take 1 tablet (5 mg total) by mouth daily., Disp: 90 tablet, Rfl: 1   Spacer/Aero-Holding Chambers (AEROCHAMBER PLUS) inhaler, Use as instructed, Disp: 1 each, Rfl: 0   tirzepatide (MOUNJARO) 15 MG/0.5ML Pen, Inject 15 mg into the skin once a week., Disp: 6 mL, Rfl: 5  Observations/Objective: Patient is well-developed, well-nourished in no acute distress.  Resting comfortably  at  home.  Head is normocephalic, atraumatic.  No labored breathing.  Speech is clear and coherent with logical content.  Patient is alert and oriented at baseline.  Post oral pharynx erythematous with vesicular lesions noted- according to patient- I am not able to visualize.   Assessment and Plan:  Lindsay Sanchez in today with chief complaint of Sore Throat   1. Pharyngitis, unspecified etiology Force fluids Motrin or tylenol OTC OTC decongestant Throat lozenges if help New toothbrush in 3 days Meds ordered this encounter  Medications   amoxicillin-clavulanate (AUGMENTIN) 875-125 MG tablet    Sig: Take 1 tablet by mouth 2 (two) times daily.    Dispense:  14 tablet    Refill:  0    Order Specific Question:  Supervising Provider    Answer:   Merrilee Jansky [[id number]]   May be viral so antibiotic may not help.    Follow Up Instructions: I discussed the assessment and treatment plan with the patient. The patient was provided an opportunity to ask questions and all were answered. The patient agreed with the plan and demonstrated an understanding of the instructions.  A copy of instructions were sent to the patient via MyChart.  The patient was advised to call back or seek an in-person evaluation if the symptoms worsen or if the condition fails to improve as anticipated.  Time:  I spent 6 minutes with the patient via telehealth technology discussing the above problems/concerns.    Mary-Margaret Daphine Deutscher, FNP

## 2023-07-14 ENCOUNTER — Other Ambulatory Visit: Payer: Self-pay | Admitting: Family Medicine

## 2023-07-14 DIAGNOSIS — J3081 Allergic rhinitis due to animal (cat) (dog) hair and dander: Secondary | ICD-10-CM

## 2023-07-14 NOTE — Telephone Encounter (Signed)
Orders placed, they will just need to call to schedule the lab appointment.

## 2023-07-18 ENCOUNTER — Ambulatory Visit: Payer: BC Managed Care – PPO | Admitting: Physical Therapy

## 2023-07-22 ENCOUNTER — Encounter: Payer: Self-pay | Admitting: Family Medicine

## 2023-09-23 ENCOUNTER — Other Ambulatory Visit: Payer: Self-pay | Admitting: Medical Genetics

## 2023-09-23 DIAGNOSIS — Z006 Encounter for examination for normal comparison and control in clinical research program: Secondary | ICD-10-CM

## 2023-09-26 ENCOUNTER — Other Ambulatory Visit: Payer: Self-pay | Admitting: Family Medicine

## 2023-09-26 DIAGNOSIS — J3081 Allergic rhinitis due to animal (cat) (dog) hair and dander: Secondary | ICD-10-CM

## 2023-10-12 ENCOUNTER — Encounter: Payer: Self-pay | Admitting: Family Medicine

## 2023-10-12 ENCOUNTER — Telehealth: Payer: BC Managed Care – PPO | Admitting: Family Medicine

## 2023-10-12 DIAGNOSIS — J019 Acute sinusitis, unspecified: Secondary | ICD-10-CM | POA: Diagnosis not present

## 2023-10-12 MED ORDER — CEFUROXIME AXETIL 500 MG PO TABS: 500.0000 mg | ORAL_TABLET | Freq: Two times a day (BID) | ORAL | 0 refills | Status: AC

## 2023-10-12 NOTE — Progress Notes (Signed)
Subjective:    Patient ID: Lindsay Sanchez, female    DOB: 01-10-1971, 52 y.o.   MRN: [id number]  HPI Virtual Visit via Video Note  I connected with the patient on 10/12/23 at  2:15 PM EST by a video enabled telemedicine application and verified that I am speaking with the correct person using two identifiers.  Location patient: home Location provider:work or home office Persons participating in the virtual visit: patient, provider  I discussed the limitations of evaluation and management by telemedicine and the availability of in person appointments. The patient expressed understanding and agreed to proceed.   HPI: Here for one week of sinus pressure, PND, ST, and coughing up green sputum. No fever or SOB. Using Mucinex.    ROS: See pertinent positives and negatives per HPI.  Past Medical History:  Diagnosis Date   Anxiety    anxiety attacks are infrequent   Asthma    Chest tightness    Diabetes (HCC)    Dry mouth    patient states her dentist diagnosed iwth dry mouth-uses Xylitol tabs qhs   Macular degeneration    per patient-treated by Dr Dione Booze   Meralgia paresthetica of left side 12/12/2015   Obesity 09/27/2012   Urinary frequency     Past Surgical History:  Procedure Laterality Date   ABDOMINAL HYSTERECTOMY     precancer cells on pap smear; thinks left ovar remains   CESAREAN SECTION     FOOT SURGERY     cyst removal   HAMMERTOE RECONSTRUCTION WITH WEIL OSTEOTOMY Right 12/25/2015   Procedure: RIGHT THIRD HAMMERTOE CORRECTION, WEIL OSTEOTOMY;  Surgeon: Toni Arthurs, MD;  Location: [facility];  Service: Orthopedics;  Laterality: Right;   HAND SURGERY     HARDWARE REMOVAL Right 12/25/2015   Procedure: RIGHT FIFTH METATARSAL DEEP IMPLANT REMOVAL, EXOSTECTOMY;  Surgeon: Toni Arthurs, MD;  Location: [facility];  Service: Orthopedics;  Laterality: Right;   MOUTH SURGERY     [facility] [place]--removal of cyst per patient-awaiting path  results   NECK SURGERY     cervical fusion   ROOT CANAL     TONSILLECTOMY      Family History  Problem Relation Age of Onset   Arrhythmia Mother        V TACH   Ovarian cancer Mother    Breast cancer Mother 87   Heart attack Father 64   Heart attack Maternal Grandfather    Cancer Maternal Grandfather    Heart disease Paternal Grandmother    Sudden Cardiac Death Brother    Heart attack Brother    Breast cancer Maternal Aunt    Breast cancer Maternal Aunt    Breast cancer Cousin        maternal first cousin     Current Outpatient Medications:    Accu-Chek FastClix Lancets MISC, Use as directed, Disp: 102 each, Rfl: 3   albuterol (PROVENTIL HFA;VENTOLIN HFA) 108 (90 Base) MCG/ACT inhaler, Inhale 2 puffs into the lungs every 6 (six) hours as needed., Disp: 1 Inhaler, Rfl: 0   baclofen (LIORESAL) 10 MG tablet, Take 1 tablet (10 mg total) by mouth 3 (three) times daily., Disp: 30 each, Rfl: 0   Blood Glucose Monitoring Suppl (CONTOUR BLOOD GLUCOSE SYSTEM) w/Device KIT, 1 strip by Does not apply route daily., Disp: 1 kit, Rfl: 0   cetirizine (ZYRTEC) 10 MG tablet, Take 10 mg by mouth at bedtime., Disp: , Rfl:    ciprofloxacin-dexamethasone (CIPRODEX) OTIC suspension, Place  4 drops into the right ear 2 (two) times daily., Disp: 7.5 mL, Rfl: 0   diclofenac (VOLTAREN) 75 MG EC tablet, Take 1 tablet (75 mg total) by mouth 2 (two) times daily., Disp: 60 tablet, Rfl: 5   estradiol (ESTRACE) 2 MG tablet, Take 1 tablet (2 mg total) by mouth daily., Disp: 90 tablet, Rfl: 1   glucose blood (ACCU-CHEK GUIDE) test strip, USE TO CHECK BLOOD SUGAR ONCE DAILY IN THE MORNING, Disp: 100 each, Rfl: 3   loratadine (CLARITIN) 10 MG tablet, Take 10 mg by mouth every morning., Disp: , Rfl:    losartan (COZAAR) 25 MG tablet, Take 1 tablet (25 mg total) by mouth daily., Disp: 90 tablet, Rfl: 1   montelukast (SINGULAIR) 10 MG tablet, TAKE 1 TABLET BY MOUTH AT BEDTIME, Disp: 90 tablet, Rfl: 0   MOUNJARO 15  MG/0.5ML Pen, Inject 15 mg into the skin once a week., Disp: , Rfl:    Multiple Vitamin (MULTIVITAMIN) tablet, Take 1 tablet by mouth daily., Disp: , Rfl:    OVER THE COUNTER MEDICATION, Xylitol melts-at bedtime for dry mouth, Disp: , Rfl:    rosuvastatin (CRESTOR) 5 MG tablet, Take 1 tablet (5 mg total) by mouth daily., Disp: 90 tablet, Rfl: 1   Spacer/Aero-Holding Chambers (AEROCHAMBER PLUS) inhaler, Use as instructed, Disp: 1 each, Rfl: 0   tirzepatide (MOUNJARO) 15 MG/0.5ML Pen, Inject 15 mg into the skin once a week., Disp: 6 mL, Rfl: 5  EXAM:  VITALS per patient if applicable:  GENERAL: alert, oriented, appears well and in no acute distress  HEENT: atraumatic, conjunttiva clear, no obvious abnormalities on inspection of external nose and ears  NECK: normal movements of the head and neck  LUNGS: on inspection no signs of respiratory distress, breathing rate appears normal, no obvious gross SOB, gasping or wheezing  CV: no obvious cyanosis  MS: moves all visible extremities without noticeable abnormality  PSYCH/NEURO: pleasant and cooperative, no obvious depression or anxiety, speech and thought processing grossly intact  ASSESSMENT AND PLAN: Sinusitis, treat with 10 days of cefuroxime.  Gershon Crane, MD  Discussed the following assessment and plan:  No diagnosis found.     I discussed the assessment and treatment plan with the patient. The patient was provided an opportunity to ask questions and all were answered. The patient agreed with the plan and demonstrated an understanding of the instructions.   The patient was advised to call back or seek an in-person evaluation if the symptoms worsen or if the condition fails to improve as anticipated.      Review of Systems     Objective:   Physical Exam        Assessment & Plan:

## 2023-10-14 NOTE — Progress Notes (Signed)
Office Visit Note  Patient: Lindsay Sanchez             Date of Birth: 1971/10/06           MRN: [id number]             PCP: Karie Georges, MD Referring: Karie Georges, MD Visit Date: 10/28/2023 Occupation: @GUAROCC @  Subjective:  Pain in multiple joints and abnormal labs  History of Present Illness: Lindsay Sanchez is a 52 y.o. female in consultation per request of her PCP.  According the patient she has had joint pain and stiffness for multiple years.  She has been experiencing increased pain and stiffness since February 2023.  She states she started having increased neck stiffness.  She has had cervical spine surgery in the past requiring fusion.  She said at the time she was diagnosed with calcific tendinitis in the jaw due to clenching of her teeth.  She has been experiencing pain and discomfort in her right shoulder joint for many years.  She has been followed at [facility] and had cortisone injections in the past.  She complains of pain and discomfort in her wrist and her hands.  She notices intermittent swelling in her hands.  She also describes discomfort in bilateral trochanteric region and difficulty sleeping on her side.  She had left knee joint torn meniscus in the past for which she was seen by Dr. Su Hilt at [facility]  She was advised total knee replacement.  She continues to have discomfort in her right knee joint.  She has been also experiencing pain and discomfort in bilateral feet.  She had plantar fasciitis surgery by Dr. Victorino Dike on her right foot.  She also had surgery on her right hammertoe and fifth toe by Dr. Ulice Brilliant followed by another surgery by Dr. Victorino Dike on her right third toe and fifth toe.  She continues to have pain and discomfort in bilateral feet.  She complains of swelling in the bilateral feet.  She uses compression socks.  She also notices some swelling in her hands.  She has had lower back pain for many years.  [facility] as [facility].  She  had labs done by her PCP when she was complaining of joint pain.  At the time her ANA came positive.  She was referred to me for further evaluation.  Patient complains of dry eyes for several years and dry mouth recently.  She also complains of Raynaud's phenomenon and hair loss.  She denies any history of photosensitivity or lymphadenopathy.  There is no family history of autoimmune disease.  She is gravida 4, para 2, miscarriages 2.  There is no history of preeclampsia or DVTs.  Status post hysterectomy.  She works as a Research scientist (medical).  She worked previously as a Runner, broadcasting/film/video.  She enjoys walking for exercise.    Activities of Daily Living:  Patient reports morning stiffness for 1-1.5 hours.   Patient Reports nocturnal pain.  Difficulty dressing/grooming: Reports Difficulty climbing stairs: Reports Difficulty getting out of chair: Reports Difficulty using hands for taps, buttons, cutlery, and/or writing: Reports  Review of Systems  Constitutional:  Positive for fatigue.  HENT:  Positive for mouth dryness. Negative for mouth sores.   Eyes:  Positive for dryness.  Respiratory:  Negative for shortness of breath.   Cardiovascular:  Negative for chest pain and palpitations.  Gastrointestinal:  Positive for constipation. Negative for blood in stool and diarrhea.  Endocrine: Positive for increased urination.  Genitourinary:  Positive for involuntary urination.  Musculoskeletal:  Positive for joint pain, gait problem, joint pain, myalgias, muscle weakness, morning stiffness, muscle tenderness and myalgias. Negative for joint swelling.  Skin:  Positive for color change and hair loss. Negative for rash and sensitivity to sunlight.  Allergic/Immunologic: Positive for susceptible to infections.  Neurological:  Positive for numbness and headaches. Negative for dizziness.  Hematological:  Negative for swollen glands.  Psychiatric/Behavioral:  Positive for sleep disturbance. Negative for depressed mood. The  patient is nervous/anxious.     PMFS History:  Patient Active Problem List   Diagnosis Date Noted   Ear pain 04/04/2023   Elevated blood pressure reading 04/04/2023   Allergic rhinitis 06/18/2022   Mild intermittent asthma without complication 07/25/2020   Depression, recurrent (HCC) 07/25/2020   Morbid obesity (HCC) 03/08/2016   Meralgia paresthetica of left side 12/12/2015   Obesity 09/27/2012   Family history of coronary artery disease 09/27/2012   Controlled type 2 diabetes mellitus with complication, without long-term current use of insulin (HCC)     Past Medical History:  Diagnosis Date   Anxiety    anxiety attacks are infrequent   Asthma    Chest tightness    Diabetes (HCC)    Dry mouth    patient states her dentist diagnosed iwth dry mouth-uses Xylitol tabs qhs   Macular degeneration    per patient-treated by Dr Dione Booze   Meralgia paresthetica of left side 12/12/2015   Obesity 09/27/2012   Urinary frequency     Family History  Problem Relation Age of Onset   Arrhythmia Mother        V TACH   Ovarian cancer Mother    Breast cancer Mother 38   Atrial fibrillation Mother    Heart attack Father 52   Sudden Cardiac Death Brother    Heart attack Brother    Healthy Brother    Breast cancer Maternal Aunt    Breast cancer Maternal Aunt    Heart attack Paternal Uncle    Diabetes Paternal Uncle    Heart attack Maternal Grandfather    Cancer Maternal Grandfather    Heart disease Paternal Grandmother    Cancer Cousin    Heart attack Cousin    Heart attack Cousin    Heart attack Cousin    Heart attack Cousin    Heart attack Cousin    Epilepsy Son        as a child   Anemia Daughter    Obesity Daughter    Past Surgical History:  Procedure Laterality Date   ABDOMINAL HYSTERECTOMY     precancer cells on pap smear; thinks left ovar remains   CESAREAN SECTION     FOOT SURGERY Right    cyst removal   HAMMERTOE RECONSTRUCTION WITH WEIL OSTEOTOMY Right 12/25/2015    Procedure: RIGHT THIRD HAMMERTOE CORRECTION, WEIL OSTEOTOMY;  Surgeon: Toni Arthurs, MD;  Location: [facility];  Service: Orthopedics;  Laterality: Right;   HAND SURGERY Bilateral    HARDWARE REMOVAL Right 12/25/2015   Procedure: RIGHT FIFTH METATARSAL DEEP IMPLANT REMOVAL, EXOSTECTOMY;  Surgeon: Toni Arthurs, MD;  Location: [facility];  Service: Orthopedics;  Laterality: Right;   MOUTH SURGERY     [facility] [place]--removal of cyst per patient-awaiting path results   NECK SURGERY     cervical fusion   ROOT CANAL     TONSILLECTOMY     Social History   Social History Narrative   Patient drinks about  1 cup of caffeine daily.   Patient is right handed.    Immunization History  Administered Date(s) Administered   Influenza,inj,Quad PF,6+ Mos 08/25/2018, 08/08/2020, 08/14/2021, 08/27/2022   Influenza-Unspecified 07/22/2023   Moderna Covid-19 Vaccine Bivalent Booster 51yrs & up 07/22/2023   PFIZER(Purple Top)SARS-COV-2 Vaccination 04/25/2020, 05/22/2020, 11/30/2020, 11/30/2020   Pneumococcal Polysaccharide-23 07/20/2018   Td 07/20/2018   Zoster Recombinant(Shingrix) 03/22/2022, 06/18/2022     Objective: Vital Signs: BP 138/84 (BP Location: Right Arm, Patient Position: Sitting, Cuff Size: Large)   Pulse 75   Resp 17   Ht 5\' 6"  (1.676 m)   Wt 266 lb 3.2 oz (120.7 kg)   BMI 42.97 kg/m    Physical Exam Vitals and nursing note reviewed.  Constitutional:      Appearance: She is well-developed.  HENT:     Head: Normocephalic and atraumatic.  Eyes:     Conjunctiva/sclera: Conjunctivae normal.  Cardiovascular:     Rate and Rhythm: Normal rate and regular rhythm.     Heart sounds: Normal heart sounds.  Pulmonary:     Effort: Pulmonary effort is normal.     Breath sounds: Normal breath sounds.  Abdominal:     General: Bowel sounds are normal.     Palpations: Abdomen is soft.  Musculoskeletal:     Cervical back: Normal range of motion.   Lymphadenopathy:     Cervical: No cervical adenopathy.  Skin:    General: Skin is warm and dry.     Capillary Refill: Capillary refill takes less than 2 seconds.  Neurological:     Mental Status: She is alert and oriented to person, place, and time.  Psychiatric:        Behavior: Behavior normal.      Musculoskeletal Exam: Patient had limited lateral rotation of the cervical spine.  She had painful range of motion of her right shoulder joint.  Left shoulder joint was in full range of motion without any discomfort.  Elbow joints, wrist joints, MCPs PIPs and DIPs with good range of motion without any synovitis.  PIP and DIP thickening was noted.  She had good range of motion of bilateral hip joints with some tenderness over trochanteric region.  Knee joints were in good range of motion without any warmth swelling or effusion.  She had discomfort range of motion of bilateral knee joints.  She had tenderness over bilateral ankle joints without any warmth swelling or effusion.  She had postsurgical changes on her toes without any synovitis.  She had generalized hyperalgesia and positive tender points.  CDAI Exam: CDAI Score: -- Patient Global: --; Provider Global: -- Swollen: --; Tender: -- Joint Exam 10/28/2023   No joint exam has been documented for this visit   There is currently no information documented on the homunculus. Go to the Rheumatology activity and complete the homunculus joint exam.  Investigation: No additional findings.  Imaging: No results found.  Recent Labs: Lab Results  Component Value Date   WBC 7.7 12/14/2021   HGB 13.5 12/14/2021   PLT 204.0 12/14/2021   NA 138 05/23/2023   K 4.4 05/23/2023   CL 102 05/23/2023   CO2 28 05/23/2023   GLUCOSE 99 05/23/2023   BUN 19 05/23/2023   CREATININE 0.65 05/23/2023   BILITOT 0.5 05/23/2023   ALKPHOS 68 05/23/2023   AST 21 05/23/2023   ALT 15 05/23/2023   PROT 7.5 05/23/2023   ALBUMIN 4.4 05/23/2023   CALCIUM  9.5 05/23/2023   GFRAA >60 12/19/2015  May 23, 2023 RF negative, ANA positive, dsDNA negative, SSA negative, SSB 1.0, Smith negative, RNP 1.5, urine protein creatinine ratio normal  Speciality Comments: No specialty comments available.  Procedures:  No procedures performed Allergies: Bee venom, Actos [pioglitazone], Jardiance [empagliflozin], Metformin and related, Other, and Adhesive [tape]   Assessment / Plan:     Visit Diagnoses: Polyarthralgia -patient complains of pain and discomfort in multiple joints for the last several years.  The pain has been worse in the last 1 year.  She has been under care of orthopedic surgeons over the years.  [facility].  Plan: Sedimentation rate  Positive ANA (antinuclear antibody) - Positive ANA, positive SSB, positive RNP -Labs obtained recently showed few low titer antibodies.  Patient gives history of fatigue, dry mouth, dry eyes, joint pain, hair loss and Raynaud's phenomenon.  I will check labs again today.  Plan: ANA, Anti-scleroderma antibody, C3 and C4, Sjogrens syndrome-B extractable nuclear antibody, RNP Antibody, Beta-2 glycoprotein antibodies, Cardiolipin antibodies, IgG, IgM, IgA  Sicca syndrome (HCC)-she gives history of dry eyes for several years and dry mouth recently.  Which could be related to medication use.  Raynaud's syndrome without gangrene-she gives history of mild Raynaud's symptoms.  No sclerodactyly or nailbed capillary changes were noted.  She has slightly decreased capillary refill.  No digital ulcers were noted.  Pain in both hands -she complains of pain and discomfort in the bilateral hands.  Bilateral PIP and DIP thickening with no synovitis was noted.  Plan: XR Hand 2 View Right, XR Hand 2 View Left, x-rays with history of osteoarthritis.  Cyclic citrul peptide antibody, IgG  History of bilateral carpal tunnel release-several years ago.  Patient states her symptoms have come back.  She works as a Nature conservation officer.  Chronic pain of both knees -she has been seeing orthopedic surgeons for last several years.  She states she had left knee joint meniscal tear surgery and was advised to left total knee replacement in the past.  She has been also having discomfort in her right knee joint.  No warmth swelling or effusion was noted.  Plan: XR KNEE 3 VIEW RIGHT.  X-rays showed moderate osteoarthritis and moderate chondromalacia patella.  No chondrocalcinosis was noted.  Primary osteoarthritis of both feet-she has had surgery on her right first third and fifth toes by Dr. Ulice Brilliant and Dr. Victorino Dike.  She is followed by Dr. Ulice Brilliant now.  No synovitis was noted on the examination.  Plantar fasciitis of right foot -she had right foot plantar fascia surgery by Dr. Victorino Dike.  Right foot x-ray January 30, 2021 showed mild degenerative changes and postsurgical changes  DDD (degenerative disc disease), cervical - Status post fusion Dr. Thelma Barge.  She had limited range of motion cervical spine with a stiffness.  Lumbar spondylosis - MRI November 22, 2015.  MRI findings were reviewed.  She continues to have some lower back pain.  Patient states she goes to the chiropractor.  Myalgia -she complains of discomfort in her muscles.  No muscular weakness was noted.  Plan: CK  Other fatigue -she gives a tree of chronic fatigue.  Plan: CBC with Differential/Platelet, COMPLETE METABOLIC PANEL WITH GFR, TSH  Other medical problems are listed as follows: Meralgia paresthetica of left side  Controlled type 2 diabetes mellitus with complication, without long-term current use of insulin (HCC)  Mild intermittent asthma without complication  Anxiety and depression  Seasonal allergic rhinitis due to other allergic trigger  Orders: Orders Placed This  Encounter  Procedures   XR Hand 2 View Right   XR Hand 2 View Left   XR KNEE 3 VIEW RIGHT   CBC with Differential/Platelet   COMPLETE METABOLIC PANEL WITH GFR   Sedimentation rate   CK    TSH   Cyclic citrul peptide antibody, IgG   ANA   Anti-scleroderma antibody   C3 and C4   Sjogrens syndrome-B extractable nuclear antibody   RNP Antibody   Beta-2 glycoprotein antibodies   Cardiolipin antibodies, IgG, IgM, IgA   No orders of the defined types were placed in this encounter.   Follow-Up Instructions: Return for Polyarthralgia and myalgia.   Pollyann Savoy, MD  Note - This record has been created using Animal nutritionist.  Chart creation errors have been sought, but may not always  have been located. Such creation errors do not reflect on  the standard of medical care.

## 2023-10-28 ENCOUNTER — Encounter: Payer: Self-pay | Admitting: Rheumatology

## 2023-10-28 ENCOUNTER — Ambulatory Visit: Payer: BC Managed Care – PPO | Attending: Rheumatology | Admitting: Rheumatology

## 2023-10-28 ENCOUNTER — Ambulatory Visit: Payer: BC Managed Care – PPO

## 2023-10-28 VITALS — BP 138/84 | HR 75 | Resp 17 | Ht 66.0 in | Wt 266.2 lb

## 2023-10-28 DIAGNOSIS — G8929 Other chronic pain: Secondary | ICD-10-CM

## 2023-10-28 DIAGNOSIS — M79642 Pain in left hand: Secondary | ICD-10-CM

## 2023-10-28 DIAGNOSIS — M25561 Pain in right knee: Secondary | ICD-10-CM

## 2023-10-28 DIAGNOSIS — M79641 Pain in right hand: Secondary | ICD-10-CM | POA: Diagnosis not present

## 2023-10-28 DIAGNOSIS — J3089 Other allergic rhinitis: Secondary | ICD-10-CM

## 2023-10-28 DIAGNOSIS — M19071 Primary osteoarthritis, right ankle and foot: Secondary | ICD-10-CM

## 2023-10-28 DIAGNOSIS — M791 Myalgia, unspecified site: Secondary | ICD-10-CM | POA: Diagnosis not present

## 2023-10-28 DIAGNOSIS — M47816 Spondylosis without myelopathy or radiculopathy, lumbar region: Secondary | ICD-10-CM

## 2023-10-28 DIAGNOSIS — I7300 Raynaud's syndrome without gangrene: Secondary | ICD-10-CM | POA: Diagnosis not present

## 2023-10-28 DIAGNOSIS — R7689 Other specified abnormal immunological findings in serum: Secondary | ICD-10-CM

## 2023-10-28 DIAGNOSIS — M19072 Primary osteoarthritis, left ankle and foot: Secondary | ICD-10-CM

## 2023-10-28 DIAGNOSIS — R768 Other specified abnormal immunological findings in serum: Secondary | ICD-10-CM | POA: Diagnosis not present

## 2023-10-28 DIAGNOSIS — M722 Plantar fascial fibromatosis: Secondary | ICD-10-CM

## 2023-10-28 DIAGNOSIS — M2550 Pain in unspecified joint: Secondary | ICD-10-CM

## 2023-10-28 DIAGNOSIS — M25562 Pain in left knee: Secondary | ICD-10-CM

## 2023-10-28 DIAGNOSIS — F419 Anxiety disorder, unspecified: Secondary | ICD-10-CM

## 2023-10-28 DIAGNOSIS — J4520 Mild intermittent asthma, uncomplicated: Secondary | ICD-10-CM

## 2023-10-28 DIAGNOSIS — M3500 Sicca syndrome, unspecified: Secondary | ICD-10-CM

## 2023-10-28 DIAGNOSIS — F32A Depression, unspecified: Secondary | ICD-10-CM

## 2023-10-28 DIAGNOSIS — R5383 Other fatigue: Secondary | ICD-10-CM | POA: Diagnosis not present

## 2023-10-28 DIAGNOSIS — G5712 Meralgia paresthetica, left lower limb: Secondary | ICD-10-CM

## 2023-10-28 DIAGNOSIS — Z9889 Other specified postprocedural states: Secondary | ICD-10-CM

## 2023-10-28 DIAGNOSIS — F339 Major depressive disorder, recurrent, unspecified: Secondary | ICD-10-CM

## 2023-10-28 DIAGNOSIS — E118 Type 2 diabetes mellitus with unspecified complications: Secondary | ICD-10-CM

## 2023-10-28 DIAGNOSIS — M5030 Other cervical disc degeneration, unspecified cervical region: Secondary | ICD-10-CM

## 2023-10-31 ENCOUNTER — Other Ambulatory Visit: Payer: Self-pay | Admitting: Family Medicine

## 2023-10-31 DIAGNOSIS — E118 Type 2 diabetes mellitus with unspecified complications: Secondary | ICD-10-CM

## 2023-11-03 LAB — COMPLETE METABOLIC PANEL WITH GFR
AG Ratio: 1.7 (calc) (ref 1.0–2.5)
ALT: 15 U/L (ref 6–29)
AST: 19 U/L (ref 10–35)
Albumin: 4.7 g/dL (ref 3.6–5.1)
Alkaline phosphatase (APISO): 69 U/L (ref 37–153)
BUN: 16 mg/dL (ref 7–25)
CO2: 28 mmol/L (ref 20–32)
Calcium: 9.5 mg/dL (ref 8.6–10.4)
Chloride: 102 mmol/L (ref 98–110)
Creat: 0.75 mg/dL (ref 0.50–1.03)
Globulin: 2.8 g/dL (ref 1.9–3.7)
Glucose, Bld: 113 mg/dL — ABNORMAL HIGH (ref 65–99)
Potassium: 4.7 mmol/L (ref 3.5–5.3)
Sodium: 141 mmol/L (ref 135–146)
Total Bilirubin: 0.5 mg/dL (ref 0.2–1.2)
Total Protein: 7.5 g/dL (ref 6.1–8.1)
eGFR: 95 mL/min/{1.73_m2} (ref >=60)

## 2023-11-03 LAB — CBC WITH DIFFERENTIAL/PLATELET
Absolute Lymphocytes: 1476 {cells}/uL (ref 850–3900)
Absolute Monocytes: 549 {cells}/uL (ref 200–950)
Basophils Absolute: 43 {cells}/uL (ref 0–200)
Basophils Relative: 0.7 %
Eosinophils Absolute: 67 {cells}/uL (ref 15–500)
Eosinophils Relative: 1.1 %
HCT: 40.4 % (ref 35.0–45.0)
Hemoglobin: 13.5 g/dL (ref 11.7–15.5)
MCH: 29.3 pg (ref 27.0–33.0)
MCHC: 33.4 g/dL (ref 32.0–36.0)
MCV: 87.8 fL (ref 80.0–100.0)
MPV: 10.3 fL (ref 7.5–12.5)
Monocytes Relative: 9 %
Neutro Abs: 3965 {cells}/uL (ref 1500–7800)
Neutrophils Relative %: 65 %
Platelets: 238 10*3/uL (ref 140–400)
RBC: 4.6 10*6/uL (ref 3.80–5.10)
RDW: 12.4 % (ref 11.0–15.0)
Total Lymphocyte: 24.2 %
WBC: 6.1 10*3/uL (ref 3.8–10.8)

## 2023-11-03 LAB — C3 AND C4
C3 Complement: 162 mg/dL (ref 83–193)
C4 Complement: 18 mg/dL (ref 15–57)

## 2023-11-03 LAB — BETA-2 GLYCOPROTEIN ANTIBODIES
Beta-2 Glyco 1 IgA: <2 U/mL (ref <20.0)
Beta-2 Glyco 1 IgM: 5.2 U/mL (ref <20.0)
Beta-2 Glyco I IgG: <2 U/mL (ref <20.0)

## 2023-11-03 LAB — CYCLIC CITRUL PEPTIDE ANTIBODY, IGG: Cyclic Citrullin Peptide Ab: <16 U

## 2023-11-03 LAB — ANA: Anti Nuclear Antibody (ANA): NEGATIVE

## 2023-11-03 LAB — SJOGRENS SYNDROME-B EXTRACTABLE NUCLEAR ANTIBODY: SSB (La) (ENA) Antibody, IgG: 1.2 AI — AB

## 2023-11-03 LAB — TSH: TSH: 1.11 m[IU]/L

## 2023-11-03 LAB — CK: Total CK: 63 U/L (ref 29–143)

## 2023-11-03 LAB — CARDIOLIPIN ANTIBODIES, IGG, IGM, IGA
Anticardiolipin IgA: <2 [APL'U]/mL (ref <20.0)
Anticardiolipin IgG: <2 [GPL'U]/mL (ref <20.0)
Anticardiolipin IgM: 4.8 [MPL'U]/mL (ref <20.0)

## 2023-11-03 LAB — ANTI-SCLERODERMA ANTIBODY: Scleroderma (Scl-70) (ENA) Antibody, IgG: <1 AI

## 2023-11-03 LAB — RNP ANTIBODY: Ribonucleic Protein(ENA) Antibody, IgG: 1.2 AI — AB

## 2023-11-03 LAB — SEDIMENTATION RATE: Sed Rate: 17 mm/h (ref 0–30)

## 2023-11-03 NOTE — Progress Notes (Signed)
RNP and La antibodies are positive.  Rest the labs are within normal limits.  I will discuss results at the follow-up visit.

## 2023-11-11 NOTE — Progress Notes (Signed)
 Office Visit Note  Patient: Lindsay Sanchez             Date of Birth: [birth year]           MRN: [id number]             PCP: Ozell Heron HERO, MD Referring: Ozell Heron HERO, MD Visit Date: 11/25/2023 Occupation: @GUAROCC @  Subjective:  Pain in joints and Raynauds  History of Present Illness: JOYELLE SIEDLECKI is a 52 y.o. female with mixed connective tissue disease, sicca symptoms, Raynaud's and osteoarthritis.  She states she continues to have pain and discomfort in her bilateral hands, bilateral knees and her feet.  She continues to have neck and lower back pain.  He experiences generalized achiness.  She has increased Raynaud's symptoms during the winter months.  She continues to have dry mouth and dry eyes.  She has been using GenTeal eyedrops and XyliMelts patch.    Activities of Daily Living:  Patient reports morning stiffness for 45 minutes.   Patient Reports nocturnal pain.  Difficulty dressing/grooming: Denies Difficulty climbing stairs: Reports Difficulty getting out of chair: Reports Difficulty using hands for taps, buttons, cutlery, and/or writing: Denies  Review of Systems  Constitutional:  Positive for fatigue.  HENT:  Positive for mouth sores and mouth dryness.   Eyes:  Positive for dryness.  Respiratory:  Negative for shortness of breath.   Cardiovascular:  Negative for chest pain and palpitations.  Gastrointestinal:  Negative for blood in stool, constipation and diarrhea.  Endocrine: Negative for increased urination.  Genitourinary:  Positive for involuntary urination.  Musculoskeletal:  Positive for joint pain, gait problem, joint pain, myalgias, morning stiffness, muscle tenderness and myalgias. Negative for joint swelling and muscle weakness.  Skin:  Negative for color change, rash, hair loss and sensitivity to sunlight.  Allergic/Immunologic: Negative for susceptible to infections.  Neurological:  Positive for headaches. Negative for dizziness.   Hematological:  Negative for swollen glands.  Psychiatric/Behavioral:  Positive for sleep disturbance. Negative for depressed mood. The patient is nervous/anxious.     PMFS History:  Patient Active Problem List   Diagnosis Date Noted   Ear pain 04/04/2023   Elevated blood pressure reading 04/04/2023   Allergic rhinitis 06/18/2022   Mild intermittent asthma without complication 07/25/2020   Depression, recurrent (HCC) 07/25/2020   Morbid obesity (HCC) 03/08/2016   Meralgia paresthetica of left side 12/12/2015   Obesity 09/27/2012   Family history of coronary artery disease 09/27/2012   Controlled type 2 diabetes mellitus with complication, without long-term current use of insulin (HCC)     Past Medical History:  Diagnosis Date   Anxiety    anxiety attacks are infrequent   Asthma    Chest tightness    Diabetes (HCC)    Dry mouth    patient states her dentist diagnosed iwth dry mouth-uses Xylitol tabs qhs   Macular degeneration    per patient-treated by Dr Octavia   Meralgia paresthetica of left side 12/12/2015   Obesity 09/27/2012   Urinary frequency     Family History  Problem Relation Age of Onset   Arrhythmia Mother        V TACH   Ovarian cancer Mother    Breast cancer Mother 48   Atrial fibrillation Mother    Heart attack Father 26   Sudden Cardiac Death Brother    Heart attack Brother    Healthy Brother    Breast cancer Maternal Aunt    Breast cancer Maternal  Aunt    Heart attack Paternal Uncle    Diabetes Paternal Uncle    Heart attack Maternal Grandfather    Cancer Maternal Grandfather    Heart disease Paternal Grandmother    Cancer Cousin    Heart attack Cousin    Heart attack Cousin    Heart attack Cousin    Heart attack Cousin    Heart attack Cousin    Epilepsy Son        as a child   Anemia Daughter    Obesity Daughter    Past Surgical History:  Procedure Laterality Date   ABDOMINAL HYSTERECTOMY     precancer cells on pap smear; thinks left  ovar remains   CESAREAN SECTION     FOOT SURGERY Right    cyst removal   HAMMERTOE RECONSTRUCTION WITH WEIL OSTEOTOMY Right 12/25/2015   Procedure: RIGHT THIRD HAMMERTOE CORRECTION, WEIL OSTEOTOMY;  Surgeon: Norleen Armor, MD;  Location: [facility];  Service: Orthopedics;  Laterality: Right;   HAND SURGERY Bilateral    HARDWARE REMOVAL Right 12/25/2015   Procedure: RIGHT FIFTH METATARSAL DEEP IMPLANT REMOVAL, EXOSTECTOMY;  Surgeon: Norleen Armor, MD;  Location: [facility];  Service: Orthopedics;  Laterality: Right;   MOUTH SURGERY     [facility]--removal of cyst per patient-awaiting path results   NECK SURGERY     cervical fusion   ROOT CANAL     TONSILLECTOMY     Social History   Social History Narrative   Patient drinks about 1 cup of caffeine daily.   Patient is right handed.    Immunization History  Administered Date(s) Administered   Influenza,inj,Quad PF,6+ Mos 08/25/2018, 08/08/2020, 08/14/2021, 08/27/2022   Influenza-Unspecified 07/22/2023   Moderna Covid-19 Vaccine Bivalent Booster 67yrs & up 07/22/2023   PFIZER(Purple Top)SARS-COV-2 Vaccination 04/25/2020, 05/22/2020, 11/30/2020, 11/30/2020   Pneumococcal Polysaccharide-23 07/20/2018   Td 07/20/2018   Zoster Recombinant(Shingrix ) 03/22/2022, 06/18/2022     Objective: Vital Signs: BP 133/81 (BP Location: Left Arm, Patient Position: Sitting, Cuff Size: Normal)   Pulse 80   Resp 12   Ht 5' 7 (1.702 m)   Wt 265 lb (120.2 kg)   BMI 41.50 kg/m    Physical Exam Vitals and nursing note reviewed.  Constitutional:      Appearance: She is well-developed.  HENT:     Head: Normocephalic and atraumatic.  Eyes:     Conjunctiva/sclera: Conjunctivae normal.  Cardiovascular:     Rate and Rhythm: Normal rate and regular rhythm.     Heart sounds: Normal heart sounds.  Pulmonary:     Effort: Pulmonary effort is normal.     Breath sounds: Normal breath sounds.  Abdominal:      General: Bowel sounds are normal.     Palpations: Abdomen is soft.  Musculoskeletal:     Cervical back: Normal range of motion.  Lymphadenopathy:     Cervical: No cervical adenopathy.  Skin:    General: Skin is warm and dry.     Capillary Refill: Capillary refill takes less than 2 seconds.  Neurological:     Mental Status: She is alert and oriented to person, place, and time.  Psychiatric:        Behavior: Behavior normal.      Musculoskeletal Exam: She has some limitation with lateral rotation of the cervical spine.  There was no tenderness over thoracic or lumbar spine.  She had good range of motion of bilateral shoulder joints without discomfort.  Elbows, wrists,  MCPs PIPs and DIPs were in good range of motion.  No synovitis was noted.  PIP and DIP prominence was noted.  She had good range of motion of both hips.  She had painful range of motion of her right knee joint without any warmth swelling or effusion.  Left knee joint was in good range of motion without any discomfort.  There was no tenderness over ankles or MTPs.  CDAI Exam: CDAI Score: -- Patient Global: --; Provider Global: -- Swollen: --; Tender: -- Joint Exam 11/25/2023   No joint exam has been documented for this visit   There is currently no information documented on the homunculus. Go to the Rheumatology activity and complete the homunculus joint exam.  Investigation: No additional findings.  Imaging: XR KNEE 3 VIEW RIGHT Result Date: 10/28/2023 Moderate medial compartment narrowing with intercondylar osteophytes was noted.  Moderate patellofemoral narrowing was noted.  No chondrocalcinosis was noted. Impression: These findings are suggestive of moderate osteoarthritis and moderate chondromalacia patella.  XR Hand 2 View Right Result Date: 10/28/2023 PIP and DIP narrowing was noted.  No MCP, intercarpal or radiocarpal joint space narrowing was noted.  No erosive changes were noted. Impression: These findings are  suggestive of osteoarthritis of the hand.  XR Hand 2 View Left Result Date: 10/28/2023 PIP and DIP narrowing was noted.  No MCP, intercarpal or radiocarpal joint space narrowing was noted.  No erosive changes were noted. Impression: These findings are suggestive of osteoarthritis of the hand.   Recent Labs: Lab Results  Component Value Date   WBC 6.1 10/28/2023   HGB 13.5 10/28/2023   PLT 238 10/28/2023   NA 141 10/28/2023   K 4.7 10/28/2023   CL 102 10/28/2023   CO2 28 10/28/2023   GLUCOSE 113 (H) 10/28/2023   BUN 16 10/28/2023   CREATININE 0.75 10/28/2023   BILITOT 0.5 10/28/2023   ALKPHOS 68 05/23/2023   AST 19 10/28/2023   ALT 15 10/28/2023   PROT 7.5 10/28/2023   ALBUMIN 4.4 05/23/2023   CALCIUM  9.5 10/28/2023   GFRAA >60 12/19/2015   October 28, 2023 ESR 17, CK 63, TSH 1.11, anti-CCP negative, ANA negative, SCL 70 negative, RNP positive, SSA positive, anticardiolipin negative, beta-2  GP 1 negative  May 23, 2023 RF negative, ANA positive, dsDNA negative, SSA negative, SSB 1.0, Smith negative, RNP 1.5, urine protein creatinine ratio normal   Speciality Comments: No specialty comments available.  Procedures:  No procedures performed Allergies: Bee venom, Actos [pioglitazone], Jardiance [empagliflozin], Metformin and related, Other, and Adhesive [tape]   Assessment / Plan:     Visit Diagnoses: MCTD (mixed connective tissue disease) (HCC) - ANA negative, positive RNP, positive SSA, positive SSB, sicca symptoms, fatigue, hair loss, Raynauds. -Detail counseled regarding mixed connective tissue disease was provided.  Patient has ongoing sicca symptoms, Raynaud's and arthralgias.  No synovitis was noted.  I did detailed discussion regarding possible use of hydroxychloroquine  to  relieve her symptoms.  After detailed discussion and reviewing side effects patient wanted to proceed with hydroxychloroquine .  A handout was given and consent was taken.  I will start her on Plaquenil  200  mg p.o. twice a day.  Plan: hydroxychloroquine  (PLAQUENIL ) 200 MG tablet  Patient was counseled on the purpose, proper use, and adverse effects of hydroxychloroquine  including nausea/diarrhea, skin rash, headaches, and sun sensitivity.  Advised patient to wear sunscreen once starting hydroxychloroquine  to reduce risk of rash associated with sun sensitivity.  Discussed importance of annual eye exams while on hydroxychloroquine   to monitor to ocular toxicity and discussed importance of frequent laboratory monitoring.  Provided patient with eye exam form for baseline ophthalmologic exam.  Provided patient with educational materials on hydroxychloroquine  and answered all questions.  Patient consented to hydroxychloroquine . Will upload consent in the media tab.    Reviewed risk for QTC prolongation when used in combination with other QTc prolonging agents (including but not limited to antiarrhythmics, macrolide antibiotics, flouroquinolone antibiotics, haloperidol, quetiapine, olanzapine, risperidone, droperidol, ziprasidone, amitriptyline, citalopram, ondansetron , migraine triptans, and methadone).  Patient has been evaluated by the cardiologist in the past.  High risk medication-patient was advised to get labs in a month, 3 months and then every 5 months.  Baseline eye examination was advised and then annual eye examination was advised to rule out ocular toxicity.  Patient has an appointment coming up with Dr. Octavia in March.  Eye examination form was given.  Information regarding immunization was provided.   Raynaud's syndrome without gangrene - History of mild Raynaud's.  Nailbed capillary changes or sclerodactyly was noted.  Keeping core temperature warm and warm clothing was discussed.  Primary osteoarthritis of both hands -she continues to have discomfort in her hands.  No synovitis was noted.  Bilateral PIP and DIP thickening was noted.  X-rays obtained at the last visit were suggestive of  osteoarthritis.  X-ray findings were discussed with the patient.  History of bilateral carpal tunnel release - Status post surgery several years ago.  She has recurrence of symptoms.  She works as a research scientist (medical).  Primary osteoarthritis of both knees -she continues to have discomfort in her knee joints.  No synovitis was noted.  Moderate osteoarthritis and moderate chondromalacia patella was noted on the x-rays obtained at the last visit.  Per patient LEBRON was advised in the past.  Patient is working on weight loss and exercise.  Primary osteoarthritis of both feet -she has chronic discomfort in her feet.  Surgery on her right first third and fifth toes by Dr. Roddie and Dr. Kit.  She is followed by Dr. Roddie now.  Plantar fasciitis of right foot - plantar fascia surgery by Dr. Kit.  Right foot x-ray January 30, 2021 showed mild degenerative changes and postsurgical changes  DDD (degenerative disc disease), cervical - Status post fusion Dr. Mahalia.  She has limited range of motion.  Lumbar spondylosis - Followed by chiropractor.  Myalgia-she continues to have generalized aches and pains.  She may have a component of myofascial pain.  Other medical problems are listed as follows:  Other fatigue  Meralgia paresthetica of left side  Controlled type 2 diabetes mellitus with complication, without long-term current use of insulin (HCC)  Mild intermittent asthma without complication  Seasonal allergic rhinitis due to other allergic trigger  Anxiety and depression  Family history of coronary artery disease  Orders: No orders of the defined types were placed in this encounter.  Meds ordered this encounter  Medications   hydroxychloroquine  (PLAQUENIL ) 200 MG tablet    Sig: Take 1 tablet (200 mg total) by mouth 2 (two) times daily.    Dispense:  60 tablet    Refill:  2     Follow-Up Instructions: Return in about 3 months (around 02/23/2024) for MCTD, Osteoarthritis.   Maya Nash, MD  Note - This record has been created using Animal nutritionist.  Chart creation errors have been sought, but may not always  have been located. Such creation errors do not reflect on  the standard of medical care.

## 2023-11-15 ENCOUNTER — Other Ambulatory Visit: Payer: Self-pay | Admitting: Family Medicine

## 2023-11-15 DIAGNOSIS — E118 Type 2 diabetes mellitus with unspecified complications: Secondary | ICD-10-CM

## 2023-11-15 NOTE — Telephone Encounter (Signed)
I will fill-- she is due for her 6 month follow up in January-- please send her a message and have her schedule!

## 2023-11-15 NOTE — Telephone Encounter (Signed)
Spoke with the patient, informed her of the message below and scheduled an appt on 1/31.

## 2023-11-17 ENCOUNTER — Other Ambulatory Visit: Payer: Self-pay | Admitting: Family Medicine

## 2023-11-17 DIAGNOSIS — R030 Elevated blood-pressure reading, without diagnosis of hypertension: Secondary | ICD-10-CM

## 2023-11-17 DIAGNOSIS — E118 Type 2 diabetes mellitus with unspecified complications: Secondary | ICD-10-CM

## 2023-11-23 ENCOUNTER — Other Ambulatory Visit: Payer: Self-pay | Admitting: Family Medicine

## 2023-11-23 DIAGNOSIS — G8929 Other chronic pain: Secondary | ICD-10-CM

## 2023-11-25 ENCOUNTER — Encounter: Payer: Self-pay | Admitting: Rheumatology

## 2023-11-25 ENCOUNTER — Ambulatory Visit: Payer: BC Managed Care – PPO | Attending: Rheumatology | Admitting: Rheumatology

## 2023-11-25 VITALS — BP 133/81 | HR 80 | Resp 12 | Ht 67.0 in | Wt 265.0 lb

## 2023-11-25 DIAGNOSIS — I7300 Raynaud's syndrome without gangrene: Secondary | ICD-10-CM | POA: Diagnosis not present

## 2023-11-25 DIAGNOSIS — M19041 Primary osteoarthritis, right hand: Secondary | ICD-10-CM | POA: Diagnosis not present

## 2023-11-25 DIAGNOSIS — M722 Plantar fascial fibromatosis: Secondary | ICD-10-CM

## 2023-11-25 DIAGNOSIS — M19071 Primary osteoarthritis, right ankle and foot: Secondary | ICD-10-CM

## 2023-11-25 DIAGNOSIS — J3089 Other allergic rhinitis: Secondary | ICD-10-CM

## 2023-11-25 DIAGNOSIS — M19072 Primary osteoarthritis, left ankle and foot: Secondary | ICD-10-CM

## 2023-11-25 DIAGNOSIS — M791 Myalgia, unspecified site: Secondary | ICD-10-CM

## 2023-11-25 DIAGNOSIS — J4520 Mild intermittent asthma, uncomplicated: Secondary | ICD-10-CM

## 2023-11-25 DIAGNOSIS — M351 Other overlap syndromes: Secondary | ICD-10-CM | POA: Diagnosis not present

## 2023-11-25 DIAGNOSIS — Z79899 Other long term (current) drug therapy: Secondary | ICD-10-CM

## 2023-11-25 DIAGNOSIS — R5383 Other fatigue: Secondary | ICD-10-CM

## 2023-11-25 DIAGNOSIS — M5030 Other cervical disc degeneration, unspecified cervical region: Secondary | ICD-10-CM

## 2023-11-25 DIAGNOSIS — M170 Bilateral primary osteoarthritis of knee: Secondary | ICD-10-CM

## 2023-11-25 DIAGNOSIS — Z8249 Family history of ischemic heart disease and other diseases of the circulatory system: Secondary | ICD-10-CM

## 2023-11-25 DIAGNOSIS — Z9889 Other specified postprocedural states: Secondary | ICD-10-CM

## 2023-11-25 DIAGNOSIS — E118 Type 2 diabetes mellitus with unspecified complications: Secondary | ICD-10-CM

## 2023-11-25 DIAGNOSIS — F419 Anxiety disorder, unspecified: Secondary | ICD-10-CM

## 2023-11-25 DIAGNOSIS — M19042 Primary osteoarthritis, left hand: Secondary | ICD-10-CM

## 2023-11-25 DIAGNOSIS — G5712 Meralgia paresthetica, left lower limb: Secondary | ICD-10-CM

## 2023-11-25 DIAGNOSIS — M47816 Spondylosis without myelopathy or radiculopathy, lumbar region: Secondary | ICD-10-CM

## 2023-11-25 DIAGNOSIS — F32A Depression, unspecified: Secondary | ICD-10-CM

## 2023-11-25 MED ORDER — HYDROXYCHLOROQUINE SULFATE 200 MG PO TABS: 200.0000 mg | ORAL_TABLET | Freq: Two times a day (BID) | ORAL | 2 refills | Status: DC

## 2023-11-25 NOTE — Patient Instructions (Addendum)
 Hydroxychloroquine  Tablets What is this medication? HYDROXYCHLOROQUINE  (hye drox ee KLOR oh kwin) treats autoimmune conditions, such as rheumatoid arthritis and lupus. It works by slowing down an overactive immune system. It may also be used to prevent and treat malaria. It works by killing the parasite that causes malaria. It belongs to a group of medications called DMARDs. This medicine may be used for other purposes; ask your health care provider or pharmacist if you have questions. COMMON BRAND NAME(S): Plaquenil , Quineprox, SOVUNA  What should I tell my care team before I take this medication? They need to know if you have any of these conditions: Diabetes Eye disease, vision problems Frequently drink alcohol G6PD deficiency Heart disease Irregular heartbeat or rhythm Kidney disease Liver disease Porphyria Psoriasis An unusual or allergic reaction to hydroxychloroquine , other medications, foods, dyes, or preservatives Pregnant or trying to get pregnant Breastfeeding How should I use this medication? Take this medication by mouth with water. Take it as directed on the prescription label. Do not cut, crush, or chew this medication. Swallow the tablets whole. Take it with food. Do not take it more than directed. Take all of this medication unless your care team tells you to stop it early. Keep taking it even if you think you are better. Take products with antacids in them at a different time of day than this medication. Take this medication 4 hours before or 4 hours after antacids. Talk to your care team if you have questions. Talk to your care team about the use of this medication in children. While this medication may be prescribed for selected conditions, precautions do apply. Overdosage: If you think you have taken too much of this medicine contact a poison control center or emergency room at once. NOTE: This medicine is only for you. Do not share this medicine with others. What if I  miss a dose? If you miss a dose, take it as soon as you can. If it is almost time for your next dose, take only that dose. Do not take double or extra doses. What may interact with this medication? Do not take this medication with any of the following: Cisapride Dronedarone Pimozide Thioridazine This medication may also interact with the following: Ampicillin Antacids Cimetidine Cyclosporine Digoxin Kaolin Medications for diabetes, such as insulin, glipizide , glyburide Medications for seizures, such as carbamazepine, phenobarbital, phenytoin Mefloquine Methotrexate Other medications that cause heart rhythm changes Praziquantel This list may not describe all possible interactions. Give your health care provider a list of all the medicines, herbs, non-prescription drugs, or dietary supplements you use. Also tell them if you smoke, drink alcohol, or use illegal drugs. Some items may interact with your medicine. What should I watch for while using this medication? Visit your care team for regular checks on your progress. Tell your care team if your symptoms do not start to get better or if they get worse. You may need blood work done while you are taking this medication. If you take other medications that can affect heart rhythm, you may need more testing. Talk to your care team if you have questions. Your vision may be tested before and during use of this medication. Tell your care team right away if you have any change in your eyesight. This medication may cause serious skin reactions. They can happen weeks to months after starting the medication. Contact your care team right away if you notice fevers or flu-like symptoms with a rash. The rash may be red or purple and then  turn into blisters or peeling of the skin. Or, you might notice a red rash with swelling of the face, lips or lymph nodes in your neck or under your arms. If you or your family notice any changes in your behavior, such as  new or worsening depression, thoughts of harming yourself, anxiety, or other unusual or disturbing thoughts, or memory loss, call your care team right away. What side effects may I notice from receiving this medication? Side effects that you should report to your care team as soon as possible: Allergic reactions--skin rash, itching, hives, swelling of the face, lips, tongue, or throat Aplastic anemia--unusual weakness or fatigue, dizziness, headache, trouble breathing, increased bleeding or bruising Change in vision Heart rhythm changes--fast or irregular heartbeat, dizziness, feeling faint or lightheaded, chest pain, trouble breathing Infection--fever, chills, cough, or sore throat Low blood sugar (hypoglycemia)--tremors or shaking, anxiety, sweating, cold or clammy skin, confusion, dizziness, rapid heartbeat Muscle injury--unusual weakness or fatigue, muscle pain, dark yellow or brown urine, decrease in amount of urine Pain, tingling, or numbness in the hands or feet Rash, fever, and swollen lymph nodes Redness, blistering, peeling, or loosening of the skin, including inside the mouth Thoughts of suicide or self-harm, worsening mood, or feelings of depression Unusual bruising or bleeding Side effects that usually do not require medical attention (report to your care team if they continue or are bothersome): Diarrhea Headache Nausea Stomach pain Vomiting This list may not describe all possible side effects. Call your doctor for medical advice about side effects. You may report side effects to FDA at 1-800-FDA-1088. Where should I keep my medication? Keep out of the reach of children and pets. Store at room temperature up to 30 degrees C (86 degrees F). Protect from light. Get rid of any unused medication after the expiration date. To get rid of medications that are no longer needed or have expired: Take the medication to a medication take-back program. Check with your pharmacy or law  enforcement to find a location. If you cannot return the medication, check the label or package insert to see if the medication should be thrown out in the garbage or flushed down the toilet. If you are not sure, ask your care team. If it is safe to put it in the trash, empty the medication out of the container. Mix the medication with cat litter, dirt, coffee grounds, or other unwanted substance. Seal the mixture in a bag or container. Put it in the trash. NOTE: This sheet is a summary. It may not cover all possible information. If you have questions about this medicine, talk to your doctor, pharmacist, or health care provider.  2024 Elsevier/Gold Standard (2022-05-17 00:00:00)  Vaccines You are taking a medication(s) that can suppress your immune system.  The following immunizations are recommended: Flu annually Covid-19  RSV Td/Tdap (tetanus, diphtheria, pertussis) every 10 years Pneumonia (Prevnar 15 then Pneumovax 23 at least 1 year apart.  Alternatively, can take Prevnar 20 without needing additional dose) Shingrix : 2 doses from 4 weeks to 6 months apart  Please check with your PCP to make sure you are up to date.   Standing Labs We placed an order today for your standing lab work.   Please have your standing labs drawn in 1 month, 3 months and 5 months  Please have your labs drawn 2 weeks prior to your appointment so that the provider can discuss your lab results at your appointment, if possible.  Please note that you may see your  imaging and lab results in MyChart before we have reviewed them. We will contact you once all results are reviewed. Please allow our office up to 72 hours to thoroughly review all of the results before contacting the office for clarification of your results.  WALK-IN LAB HOURS  Monday through Thursday from 8:00 am -12:30 pm and 1:00 pm-5:00 pm and Friday from 8:00 am-12:00 pm.  Patients with office visits requiring labs will be seen before walk-in labs.   You may encounter longer than normal wait times. Please allow additional time. Wait times may be shorter on  Monday and Thursday afternoons.  We do not book appointments for walk-in labs. We appreciate your patience and understanding with our staff.   Labs are drawn by [facility]. Please bring your co-pay at the time of your lab draw.  You may receive a bill from [facility] for your lab work.  Please note if you are on Hydroxychloroquine  and and an order has been placed for a Hydroxychloroquine  level,  you will need to have it drawn 4 hours or more after your last dose.  If you wish to have your labs drawn at another location, please call the office 24 hours in advance so we can fax the orders.  The office is located at [address], [address], [place], [place] [address]   If you have any questions regarding directions or hours of operation,  please call [phone].   As a reminder, please drink plenty of water prior to coming for your lab work. Thanks!

## 2023-12-14 NOTE — Progress Notes (Signed)
Cardiology Office Note   Date:  12/19/2023   ID:  Lindsay Sanchez, Lindsay Sanchez [birth year], MRN [id number]  PCP:  Karie Georges, MD  Cardiologist:   Tamya Denardo [place], MD   No chief complaint on file.     History of Present Illness: Lindsay Sanchez is a 53 y.o. female who is seen for follow up.Last seen in Jan 2022. She has a history of DM type 2, morbid obesity, and family history of CAD. Father died at age 38 with MI and 2 cousins died at 22 and 21 respectively.   She was evaluated with a stress Echo in 2013 that was normal. She was seen by Azalee Course, PA-C, in 12/2016 at which time she reported 3 episodes of chest pain that she described as a pressure that lasted for about 4 minutes before resolving without medications.  Lexiscan Myoview was ordered and showed normal wall motion and systolic function with no evidence of ischemia. Lower extremities dopplers/ABIs were also ordered and were normal.   She only notes chest tightness with inspiration when she is under stress and usually with lying down. This has not changed from prior. No exertional symptoms and stays active. 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 She has been diagnosed with Mixed connective tissue disease and has recently been started on Plaquenil.     Past Medical History:  Diagnosis Date   Anxiety    anxiety attacks are infrequent   Asthma    Chest tightness    Diabetes (HCC)    Dry mouth    patient states her dentist diagnosed iwth dry mouth-uses Xylitol tabs qhs   Macular degeneration    per patient-treated by Dr Dione Booze   Meralgia paresthetica of left side 12/12/2015   Obesity 09/27/2012   Urinary frequency     Past Surgical History:  Procedure Laterality Date   ABDOMINAL HYSTERECTOMY     precancer cells on pap smear; thinks left ovar remains   CESAREAN SECTION     FOOT SURGERY Right    cyst removal   HAMMERTOE RECONSTRUCTION WITH WEIL OSTEOTOMY Right 12/25/2015   Procedure: RIGHT THIRD  HAMMERTOE CORRECTION, WEIL OSTEOTOMY;  Surgeon: Toni Arthurs, MD;  Location: [facility];  Service: Orthopedics;  Laterality: Right;   HAND SURGERY Bilateral    HARDWARE REMOVAL Right 12/25/2015   Procedure: RIGHT FIFTH METATARSAL DEEP IMPLANT REMOVAL, EXOSTECTOMY;  Surgeon: Toni Arthurs, MD;  Location: [facility];  Service: Orthopedics;  Laterality: Right;   MOUTH SURGERY     [facility]--removal of cyst per patient-awaiting path results   NECK SURGERY     cervical fusion   ROOT CANAL     TONSILLECTOMY       Current Outpatient Medications  Medication Sig Dispense Refill   Accu-Chek FastClix Lancets MISC Use as directed 102 each 3   albuterol (PROVENTIL HFA;VENTOLIN HFA) 108 (90 Base) MCG/ACT inhaler Inhale 2 puffs into the lungs every 6 (six) hours as needed. 1 Inhaler 0   Blood Glucose Monitoring Suppl (CONTOUR BLOOD GLUCOSE SYSTEM) w/Device KIT 1 strip by Does not apply route daily. 1 kit 0   cetirizine (ZYRTEC) 10 MG tablet Take 10 mg by mouth as needed.     diclofenac (VOLTAREN) 75 MG EC tablet Take 1 tablet by mouth twice daily 60 tablet 0   estradiol (ESTRACE) 2 MG tablet Take 1 tablet (2 mg total) by mouth daily. 90 tablet 1   glucose blood (  ACCU-CHEK GUIDE) test strip USE TO CHECK BLOOD SUGAR ONCE DAILY IN THE MORNING 100 each 3   hydroxychloroquine (PLAQUENIL) 200 MG tablet Take 1 tablet (200 mg total) by mouth 2 (two) times daily. 60 tablet 2   losartan (COZAAR) 25 MG tablet Take 1 tablet by mouth once daily 90 tablet 1   MAGNESIUM PO Take by mouth daily.     montelukast (SINGULAIR) 10 MG tablet TAKE 1 TABLET BY MOUTH AT BEDTIME 90 tablet 0   Multiple Vitamins-Minerals (PRESERVISION AREDS PO) Take by mouth daily.     OVER THE COUNTER MEDICATION Xylitol melts-at bedtime for dry mouth     rosuvastatin (CRESTOR) 5 MG tablet Take 1 tablet by mouth once daily 90 tablet 0   Spacer/Aero-Holding Chambers (AEROCHAMBER PLUS) inhaler Use as  instructed 1 each 0   tirzepatide (MOUNJARO) 15 MG/0.5ML Pen INJECT 15 MG SUBCUTANEOUSLY ONCE A WEEK 6 mL 1   VITAMIN D PO Take 5,000 Units by mouth daily.     tirzepatide (MOUNJARO) 15 MG/0.5ML Pen Inject 15 mg into the skin once a week. 6 mL 5   No current facility-administered medications for this visit.    Allergies:   Bee venom, Actos [pioglitazone], Jardiance [empagliflozin], Metformin and related, Other, and Adhesive [tape]    Social History:  The patient  reports that she has never smoked. She has been exposed to tobacco smoke. She has never used smokeless tobacco. She reports current alcohol use. She reports that she does not use drugs.   Family History:  The patient's family history includes Anemia in her daughter; Arrhythmia in her mother; Atrial fibrillation in her mother; Breast cancer in her maternal aunt and maternal aunt; Breast cancer (age of onset: 29) in her mother; Cancer in her cousin and maternal grandfather; Diabetes in her paternal uncle; Epilepsy in her son; Healthy in her brother; Heart attack in her brother, cousin, cousin, cousin, cousin, cousin, maternal grandfather, and paternal uncle; Heart attack (age of onset: 81) in her father; Heart disease in her paternal grandmother; Obesity in her daughter; Ovarian cancer in her mother; Sudden Cardiac Death in her brother.    ROS:  Please see the history of present illness.   Otherwise, review of systems are positive for none.   All other systems are reviewed and negative.    PHYSICAL EXAM: VS:  BP 110/66   Pulse 77   Ht 5\' 6"  (1.676 m)   Wt 261 lb (118.4 kg)   SpO2 99%   BMI 42.13 kg/m  , BMI Body mass index is 42.13 kg/m. GEN: Well nourished, well developed, in no acute distress  HEENT: normal  Neck: no JVD, carotid bruits, or masses Cardiac: RRR; no murmurs, rubs, or gallops,no edema  Respiratory:  clear to auscultation bilaterally, normal work of breathing GI: soft, nontender, nondistended, + BS MS: no  deformity or atrophy  Skin: warm and dry, no rash Neuro:  Strength and sensation are intact Psych: euthymic mood, full affect   EKG Interpretation Date/Time:  Monday December 19 2023 08:12:10 EST Ventricular Rate:  77 PR Interval:  132 QRS Duration:  88 QT Interval:  398 QTC Calculation: 450 R Axis:   57  Text Interpretation: Normal sinus rhythm Normal ECG When compared with ECG of Dec 22, 2020 No significant change was found Confirmed by [place], Dayanna Pryce [phone]) on 12/19/2023 8:17:05 AM    Recent Labs: 10/28/2023: ALT 15; BUN 16; Creat 0.75; Hemoglobin 13.5; Platelets 238; Potassium 4.7; Sodium 141; TSH 1.11  Lipid Panel    Component Value Date/Time   CHOL 159 05/23/2023 1425   TRIG 119.0 05/23/2023 1425   HDL 80.00 05/23/2023 1425   CHOLHDL 2 05/23/2023 1425   VLDL 23.8 05/23/2023 1425   LDLCALC 55 05/23/2023 1425   LDLCALC 83 10/27/2020 0823      Wt Readings from Last 3 Encounters:  12/19/23 261 lb (118.4 kg)  11/25/23 265 lb (120.2 kg)  10/28/23 266 lb 3.2 oz (120.7 kg)      Other studies Reviewed: Additional studies/ records that were reviewed today include:   Myoview 01/19/2017: The left ventricular ejection fraction is normal (55-65%). Nuclear stress EF: 60%. Blood pressure demonstrated a normal response to exercise. There was no ST segment deviation noted during stress. No T wave inversion was noted during stress. This is a low risk study.   No reversible ischemia. LVEF 60% with normal wall motion. This is a low risk study.  ASSESSMENT AND PLAN:  1.  HTN well controlled on losartan  2. DM type 2 - last A1c 5.8%. excellent control with weight loss and Mounjaro  3. HLD. Now on low dose Crestor. LDL down to 55. At goal.   4. Family history of early CAD.  5. Atypical chest pain. Negative Myoview in 2018. No concerning symptoms currently.  6. On high risk medication with Plaquenil. No arrhythmia noted on Ecg and QTc is normal. Will assess baseline Echo  for LV function   Current medicines are reviewed at length with the patient today.  The patient does not have concerns regarding medicines.  The following changes have been made:  no change  Labs/ tests ordered today include:   Orders Placed This Encounter  Procedures   EKG 12-Lead     Disposition:   FU with me in 1 year  Signed, Keyli Duross [place], MD  12/19/2023 8:29 AM    [facility] [address], [place], [place], [address] Phone [phone], Fax [phone]

## 2023-12-19 ENCOUNTER — Encounter: Payer: Self-pay | Admitting: Cardiology

## 2023-12-19 ENCOUNTER — Ambulatory Visit: Payer: BC Managed Care – PPO | Attending: Cardiology | Admitting: Cardiology

## 2023-12-19 VITALS — BP 110/66 | HR 77 | Ht 66.0 in | Wt 261.0 lb

## 2023-12-19 DIAGNOSIS — E66813 Obesity, class 3: Secondary | ICD-10-CM | POA: Diagnosis not present

## 2023-12-19 DIAGNOSIS — M351 Other overlap syndromes: Secondary | ICD-10-CM

## 2023-12-19 DIAGNOSIS — E118 Type 2 diabetes mellitus with unspecified complications: Secondary | ICD-10-CM | POA: Diagnosis not present

## 2023-12-19 DIAGNOSIS — Z6841 Body Mass Index (BMI) 40.0 and over, adult: Secondary | ICD-10-CM

## 2023-12-19 DIAGNOSIS — Z8249 Family history of ischemic heart disease and other diseases of the circulatory system: Secondary | ICD-10-CM | POA: Diagnosis not present

## 2023-12-19 DIAGNOSIS — E7800 Pure hypercholesterolemia, unspecified: Secondary | ICD-10-CM

## 2023-12-19 DIAGNOSIS — Z136 Encounter for screening for cardiovascular disorders: Secondary | ICD-10-CM

## 2023-12-19 NOTE — Patient Instructions (Signed)
Medication Instructions:  Your physician recommends that you continue on your current medications as directed. Please refer to the Current Medication list given to you today.  *If you need a refill on your cardiac medications before your next appointment, please call your pharmacy*   Lab Work: NONE  If you have labs (blood work) drawn today and your tests are completely normal, you will receive your results only by: MyChart Message (if you have MyChart) OR A paper copy in the mail If you have any lab test that is abnormal or we need to change your treatment, we will call you to review the results.   Testing/Procedures: Echocardiogram Your physician has requested that you have an echocardiogram. Echocardiography is a painless test that uses sound waves to create images of your heart. It provides your doctor with information about the size and shape of your heart and how well your heart's chambers and valves are working. This procedure takes approximately one hour. There are no restrictions for this procedure. Please do NOT wear cologne, perfume, aftershave, or lotions (deodorant is allowed). Please arrive 15 minutes prior to your appointment time.    Follow-Up: At [facility], you and your health needs are our priority.  As part of our continuing mission to provide you with exceptional heart care, we have created designated Provider Care Teams.  These Care Teams include your primary Cardiologist (physician) and Advanced Practice Providers (APPs -  Physician Assistants and Nurse Practitioners) who all work together to provide you with the care you need, when you need it.  We recommend signing up for the patient portal called "MyChart".  Sign up information is provided on this After Visit Summary.  MyChart is used to connect with patients for Virtual Visits (Telemedicine).  Patients are able to view lab/test results, encounter notes, upcoming appointments, etc.  Non-urgent messages can  be sent to your provider as well.   To learn more about what you can do with MyChart, go to [email or web address].    Your next appointment:   To be determined  Provider:   Peter [place], MD     Other Instructions

## 2023-12-23 ENCOUNTER — Encounter: Payer: Self-pay | Admitting: Family Medicine

## 2023-12-23 ENCOUNTER — Ambulatory Visit: Payer: BC Managed Care – PPO | Admitting: Family Medicine

## 2023-12-23 VITALS — BP 122/80 | HR 60 | Temp 98.1°F | Ht 66.0 in | Wt 257.4 lb

## 2023-12-23 DIAGNOSIS — E118 Type 2 diabetes mellitus with unspecified complications: Secondary | ICD-10-CM

## 2023-12-23 DIAGNOSIS — Z7985 Long-term (current) use of injectable non-insulin antidiabetic drugs: Secondary | ICD-10-CM | POA: Diagnosis not present

## 2023-12-23 DIAGNOSIS — R300 Dysuria: Secondary | ICD-10-CM | POA: Diagnosis not present

## 2023-12-23 LAB — URINALYSIS, ROUTINE W REFLEX MICROSCOPIC
Bilirubin Urine: NEGATIVE
Hgb urine dipstick: NEGATIVE
Ketones, ur: NEGATIVE
Leukocytes,Ua: NEGATIVE
Nitrite: NEGATIVE
RBC / HPF: NONE SEEN (ref 0–?)
Specific Gravity, Urine: >=1.03 — AB (ref 1.000–1.030)
Total Protein, Urine: NEGATIVE
Urine Glucose: NEGATIVE
Urobilinogen, UA: 0.2 (ref 0.0–1.0)
pH: 5.5 (ref 5.0–8.0)

## 2023-12-23 LAB — POCT GLYCOSYLATED HEMOGLOBIN (HGB A1C): Hemoglobin A1C: 6 % — AB (ref 4.0–5.6)

## 2023-12-23 NOTE — Progress Notes (Signed)
Established Patient Office Visit  Subjective   Patient ID: ESSANCE GATTI, female    DOB: 03/15/1971  Age: 53 y.o. MRN: [id number]  Chief Complaint  Patient presents with   Medical Management of Chronic Issues    Pt is here for 6 months follow up today.  DM-- A1C today is 6.0, she reports compliance with her medications, states she went to see the rheumatologist she was diagnosed with mixed connective tissue disorder, was placed on plaquenil. Is following regularly with the rheumatologist. Pt reports she continues on the mounjaro 15 mg weekly, no side effects to the medication.   Pt reports that she has had changes in her urinary flow-- she reports that the stream isn't as strong as before and she feels like it is "thicker" than normal. No pain with urination, no fever or chills, no other associated symptoms. Was worried now that she has been diagnosed with mixed connective tissue disease that her organs are being affected by this.     Current Outpatient Medications  Medication Instructions   Accu-Chek FastClix Lancets MISC Use as directed   albuterol (PROVENTIL HFA;VENTOLIN HFA) 108 (90 Base) MCG/ACT inhaler 2 puffs, Inhalation, Every 6 hours PRN   Blood Glucose Monitoring Suppl (CONTOUR BLOOD GLUCOSE SYSTEM) w/Device KIT 1 strip, Does not apply, Daily   cetirizine (ZYRTEC) 10 mg, As needed   diclofenac (VOLTAREN) 75 mg, Oral, 2 times daily   estradiol (ESTRACE) 2 mg, Oral, Daily   glucose blood (ACCU-CHEK GUIDE) test strip USE TO CHECK BLOOD SUGAR ONCE DAILY IN THE MORNING   hydroxychloroquine (PLAQUENIL) 200 mg, Oral, 2 times daily   losartan (COZAAR) 25 mg, Oral, Daily   MAGNESIUM PO Daily   montelukast (SINGULAIR) 10 mg, Oral, Daily at bedtime   Multiple Vitamins-Minerals (PRESERVISION AREDS PO) Daily   OVER THE COUNTER MEDICATION Xylitol melts-at bedtime for dry mouth   rosuvastatin (CRESTOR) 5 mg, Oral, Daily   Spacer/Aero-Holding Chambers (AEROCHAMBER PLUS) inhaler Use  as instructed   tirzepatide (MOUNJARO) 15 MG/0.5ML Pen INJECT 15 MG SUBCUTANEOUSLY ONCE A WEEK   tirzepatide (MOUNJARO) 15 mg, Subcutaneous, Weekly   VITAMIN D PO 5,000 Units, Daily    Patient Active Problem List   Diagnosis Date Noted   Ear pain 04/04/2023   Elevated blood pressure reading 04/04/2023   Allergic rhinitis 06/18/2022   Mild intermittent asthma without complication 07/25/2020   Depression, recurrent (HCC) 07/25/2020   Morbid obesity (HCC) 03/08/2016   Meralgia paresthetica of left side 12/12/2015   Obesity 09/27/2012   Family history of coronary artery disease 09/27/2012   Controlled type 2 diabetes mellitus with complication, without long-term current use of insulin (HCC)       Review of Systems  All other systems reviewed and are negative.     Objective:     BP 122/80   Pulse 60   Temp 98.1 F (36.7 C) (Oral)   Ht 5\' 6"  (1.676 m)   Wt 257 lb 6.4 oz (116.8 kg)   SpO2 98%   BMI 41.55 kg/m    Physical Exam Vitals reviewed.  Constitutional:      Appearance: Normal appearance. She is well-groomed. She is morbidly obese.  Cardiovascular:     Rate and Rhythm: Normal rate and regular rhythm.     Pulses: Normal pulses.     Heart sounds: S1 normal and S2 normal.  Pulmonary:     Effort: Pulmonary effort is normal.     Breath sounds: Normal breath sounds and air  entry.  Musculoskeletal:     Right lower leg: No edema.     Left lower leg: No edema.  Neurological:     Mental Status: She is alert and oriented to person, place, and time. Mental status is at baseline.     Gait: Gait is intact.  Psychiatric:        Mood and Affect: Mood and affect normal.        Speech: Speech normal.        Behavior: Behavior normal.        Judgment: Judgment normal.    Diabetic Foot Exam - Simple   Simple Foot Form Diabetic Foot exam was performed with the following findings: Yes 12/23/2023  9:34 AM  Visual Inspection No deformities, no ulcerations, no other skin  breakdown bilaterally: Yes Sensation Testing Intact to touch and monofilament testing bilaterally: Yes Pulse Check Posterior Tibialis and Dorsalis pulse intact bilaterally: Yes Comments      Results for orders placed or performed in visit on 12/23/23  POC HgB A1c  Result Value Ref Range   Hemoglobin A1C 6.0 (A) 4.0 - 5.6 %   HbA1c POC (<> result, manual entry)     HbA1c, POC (prediabetic range)     HbA1c, POC (controlled diabetic range)        The 10-year ASCVD risk score (Arnett DK, et al., 2019) is: 2.3%    Assessment & Plan:  Controlled type 2 diabetes mellitus with complication, without long-term current use of insulin (HCC) Assessment & Plan: A1C under good control however pt is on the maximum dose of mounjaro, continue mounjaro 15 mg weekly. Foot exam peformed today, she is UTD on her health maintenance. RTC in 6 months for annual physical.   Orders: -     POCT glycosylated hemoglobin (Hb A1C)  Dysuria -     Urinalysis, Routine w reflex microscopic   Will check UA with microscopy to look for any abnormalities.   Return in about 6 months (around 06/21/2024) for annual physical exam.    Karie Georges, MD

## 2023-12-23 NOTE — Assessment & Plan Note (Signed)
A1C under good control however pt is on the maximum dose of mounjaro, continue mounjaro 15 mg weekly. Foot exam peformed today, she is UTD on her health maintenance. RTC in 6 months for annual physical.

## 2023-12-24 ENCOUNTER — Other Ambulatory Visit: Payer: Self-pay | Admitting: Family Medicine

## 2023-12-24 DIAGNOSIS — G8929 Other chronic pain: Secondary | ICD-10-CM

## 2023-12-27 NOTE — Telephone Encounter (Signed)
 Yes ok to refill

## 2024-01-02 ENCOUNTER — Other Ambulatory Visit: Payer: Self-pay | Admitting: *Deleted

## 2024-01-02 DIAGNOSIS — Z79899 Other long term (current) drug therapy: Secondary | ICD-10-CM

## 2024-01-03 ENCOUNTER — Other Ambulatory Visit: Payer: Self-pay | Admitting: Family Medicine

## 2024-01-03 DIAGNOSIS — J3081 Allergic rhinitis due to animal (cat) (dog) hair and dander: Secondary | ICD-10-CM

## 2024-01-03 LAB — CBC WITH DIFFERENTIAL/PLATELET
Absolute Lymphocytes: 1485 {cells}/uL (ref 850–3900)
Absolute Monocytes: 535 {cells}/uL (ref 200–950)
Basophils Absolute: 38 {cells}/uL (ref 0–200)
Basophils Relative: 0.7 %
Eosinophils Absolute: 70 {cells}/uL (ref 15–500)
Eosinophils Relative: 1.3 %
HCT: 37 % (ref 35.0–45.0)
Hemoglobin: 12.3 g/dL (ref 11.7–15.5)
MCH: 29.4 pg (ref 27.0–33.0)
MCHC: 33.2 g/dL (ref 32.0–36.0)
MCV: 88.5 fL (ref 80.0–100.0)
MPV: 10.8 fL (ref 7.5–12.5)
Monocytes Relative: 9.9 %
Neutro Abs: 3272 {cells}/uL (ref 1500–7800)
Neutrophils Relative %: 60.6 %
Platelets: 191 10*3/uL (ref 140–400)
RBC: 4.18 10*6/uL (ref 3.80–5.10)
RDW: 12.6 % (ref 11.0–15.0)
Total Lymphocyte: 27.5 %
WBC: 5.4 10*3/uL (ref 3.8–10.8)

## 2024-01-03 LAB — COMPLETE METABOLIC PANEL WITHOUT GFR
AG Ratio: 1.7 (calc) (ref 1.0–2.5)
ALT: 15 U/L (ref 6–29)
AST: 16 U/L (ref 10–35)
Albumin: 4.2 g/dL (ref 3.6–5.1)
Alkaline phosphatase (APISO): 77 U/L (ref 37–153)
BUN: 15 mg/dL (ref 7–25)
CO2: 28 mmol/L (ref 20–32)
Calcium: 9.1 mg/dL (ref 8.6–10.4)
Chloride: 106 mmol/L (ref 98–110)
Creat: 0.73 mg/dL (ref 0.50–1.03)
Globulin: 2.5 g/dL (ref 1.9–3.7)
Glucose, Bld: 100 mg/dL — ABNORMAL HIGH (ref 65–99)
Potassium: 5.1 mmol/L (ref 3.5–5.3)
Sodium: 142 mmol/L (ref 135–146)
Total Bilirubin: 0.3 mg/dL (ref 0.2–1.2)
Total Protein: 6.7 g/dL (ref 6.1–8.1)
eGFR: 97 mL/min/1.73m2

## 2024-01-03 NOTE — Progress Notes (Signed)
CBC and CMP are normal.

## 2024-01-06 ENCOUNTER — Ambulatory Visit (HOSPITAL_COMMUNITY): Payer: BC Managed Care – PPO | Attending: Internal Medicine

## 2024-01-06 DIAGNOSIS — I350 Nonrheumatic aortic (valve) stenosis: Secondary | ICD-10-CM

## 2024-01-06 DIAGNOSIS — Z136 Encounter for screening for cardiovascular disorders: Secondary | ICD-10-CM | POA: Insufficient documentation

## 2024-01-06 LAB — ECHOCARDIOGRAM COMPLETE
Area-P 1/2: 3.48 cm2
S' Lateral: 2.9 cm

## 2024-01-23 ENCOUNTER — Other Ambulatory Visit: Payer: Self-pay | Admitting: Family Medicine

## 2024-01-23 DIAGNOSIS — Z0000 Encounter for general adult medical examination without abnormal findings: Secondary | ICD-10-CM

## 2024-01-24 ENCOUNTER — Other Ambulatory Visit: Payer: Self-pay | Admitting: Family Medicine

## 2024-01-24 DIAGNOSIS — G8929 Other chronic pain: Secondary | ICD-10-CM

## 2024-02-09 NOTE — Progress Notes (Unsigned)
 TYNASIA MCCAUL, female    DOB: [birth year]   MRN: [id number]   Brief patient profile:  53 yowf  with MCTDz never smoker but grew up with exposure lots sinus/ear infectionsbronchtis better since moved out at age 53  referred to pulmonary clinic 02/10/2024 by Corliss Skains  for doe x around Sept  2025   Pneumonia at age 53,  47, 82 hosp for the latter 53   Last child was 79  with baseline wt 250   with wt gain to 350 around 2022   Polyarthralgia spring 2024   and doe Sept 2024 and plaquenil 1st gen week Jan 2025 helping 50 % by 02/10/2024 but not breathing   Walking up driveway to pick up door 60 ft to lasnding x 6 steps stop at bottom due to knees    History of Present Illness  02/10/2024  Pulmonary/ 1st office eval/Fortunata Betty on singulair /symbicort prn but using cause it's not fall  Chief Complaint  Patient presents with   Pulmonary Consult    Referred by Dr. Corliss Skains. She c/o SOB over the past 6 months. She feels like she is unable to take a deep enough breath at times.   Dyspnea:  walking dogs 30 min per day with difficult hills that even at 330 could still do this with worse doe since 07/2024 to point hard to get up driveway which is usually easy for her. 73 f Cough: some tickle when lies down/ min production Sleep: bed is flat/ one pillow under head  SABA use: not using  02 WUJ:WJXB     No obvious day to day or daytime pattern/variability or assoc excess/ purulent sputum or mucus plugs or hemoptysis or cp or chest tightness, subjective wheeze or overt sinus or hb symptoms.    Also denies any obvious fluctuation of symptoms with weather or environmental changes or other aggravating or alleviating factors except as outlined above   No unusual exposure hx or h/o childhood pna/ asthma or knowledge of premature birth.  Current Allergies, Complete Past Medical History, Past Surgical History, Family History, and Social History were reviewed in [employer] record.  ROS   The following are not active complaints unless bolded Hoarseness, sore throat, dysphagia, dental problems, itching, sneezing,  nasal congestion or discharge of excess mucus or purulent secretions, ear ache,   fever, chills, sweats, unintended wt loss or wt gain, classically pleuritic or exertional cp,  orthopnea pnd or arm/hand swelling  or leg swelling, presyncope, palpitations, abdominal pain, anorexia, nausea, vomiting, diarrhea  or change in bowel habits or change in bladder habits, change in stools or change in urine, dysuria, hematuria,  rash, arthralgias, visual complaints, headache, numbness, weakness or ataxia or problems with walking or coordination,  change in mood or  memory.             Outpatient Medications Prior to Visit  Medication Sig Dispense Refill   Accu-Chek FastClix Lancets MISC Use as directed 102 each 3   albuterol (PROVENTIL HFA;VENTOLIN HFA) 108 (90 Base) MCG/ACT inhaler Inhale 2 puffs into the lungs every 6 (six) hours as needed. 1 Inhaler 0   Blood Glucose Monitoring Suppl (CONTOUR BLOOD GLUCOSE SYSTEM) w/Device KIT 1 strip by Does not apply route daily. 1 kit 0   cetirizine (ZYRTEC) 10 MG tablet Take 10 mg by mouth as needed.     diclofenac (VOLTAREN) 75 MG EC tablet Take 1 tablet by mouth twice daily 60 tablet 5   estradiol (ESTRACE) 2  MG tablet Take 1 tablet (2 mg total) by mouth daily. 90 tablet 1   glucose blood (ACCU-CHEK GUIDE) test strip USE TO CHECK BLOOD SUGAR ONCE DAILY IN THE MORNING 100 each 3   hydroxychloroquine (PLAQUENIL) 200 MG tablet Take 1 tablet (200 mg total) by mouth 2 (two) times daily. 60 tablet 2   losartan (COZAAR) 25 MG tablet Take 1 tablet by mouth once daily 90 tablet 1   MAGNESIUM PO Take by mouth daily.     montelukast (SINGULAIR) 10 MG tablet TAKE 1 TABLET BY MOUTH AT BEDTIME 90 tablet 1   Multiple Vitamins-Minerals (PRESERVISION AREDS PO) Take by mouth daily.     OVER THE COUNTER MEDICATION Xylitol melts-at bedtime for dry mouth      rosuvastatin (CRESTOR) 5 MG tablet Take 1 tablet by mouth once daily 90 tablet 0   Spacer/Aero-Holding Chambers (AEROCHAMBER PLUS) inhaler Use as instructed 1 each 0   tirzepatide (MOUNJARO) 15 MG/0.5ML Pen Inject 15 mg into the skin once a week. 6 mL 5   tirzepatide (MOUNJARO) 15 MG/0.5ML Pen INJECT 15 MG SUBCUTANEOUSLY ONCE A WEEK 6 mL 1   VITAMIN D PO Take 5,000 Units by mouth daily.     No facility-administered medications prior to visit.    Past Medical History:  Diagnosis Date   Anxiety    anxiety attacks are infrequent   Asthma    Chest tightness    Diabetes (HCC)    Dry mouth    patient states her dentist diagnosed iwth dry mouth-uses Xylitol tabs qhs   Macular degeneration    per patient-treated by Dr Dione Booze   Meralgia paresthetica of left side 12/12/2015   Obesity 09/27/2012   Urinary frequency       Objective:     BP 128/80 (BP Location: Left Arm, Cuff Size: Large)   Pulse 78   Ht 5\' 7"  (1.702 m)   Wt 255 lb (115.7 kg)   SpO2 99%   BMI 39.94 kg/m   SpO2: 99 %  RA  Amb mod obese (by bmi) pleasant wf nad    HEENT : Oropharynx  nl      Nasal turbinates nl    NECK :  without  apparent JVD/ palpable Nodes/TM    LUNGS: no acc muscle use,  Nl contour chest which is clear to A and P bilaterally without cough on insp or exp maneuvers   CV:  RRR  no s3 or murmur or increase in P2, and no edema   ABD:  soft and nontender   MS:  Gait nl   ext warm without deformities Or obvious joint restrictions  calf tenderness, cyanosis or clubbing    SKIN: warm and dry without lesions    NEURO:  alert, approp, nl sensorium with  no motor or cerebellar deficits apparent.   CXR PA and Lateral:   02/10/2024 :    I personally reviewed images and impression is as follows:     Wnl     Assessment   DOE (dyspnea on exertion) Onset 07/2023 in pt with MCTD since spring of 2024  - 02/10/2024   @ 255 lb Walked on RA  x  3  lap(s) =  approx 750  ft  @ mod pace, stopped due to  end of study  with lowest 02 sats 94% increased to 96% before stopping    She needs pfts and if abnormal or sats drop over time at peak ex > HRCT  chest though  rx in the setting of MCTD is focused usually on the underlying rheaumatologic problem, which is presently in good hands with Dr Corliss Skains.   F/u will be in 3 months in [place] with pfts next available.   Discussed in detail all the  indications, usual  risks and alternatives  relative to the benefits with patient who agrees to proceed with w/u as outlined.     Each maintenance medication was reviewed in detail including emphasizing most importantly the difference between maintenance and prns and under what circumstances the prns are to be triggered using an action plan format where appropriate.  Total time for H and P, chart review, counseling,  directly observing portions of ambulatory 02 saturation study/ and generating customized AVS unique to this office visit / same day charting = 45 min new pt eval                     Sandrea Hughs, MD 02/10/2024

## 2024-02-10 ENCOUNTER — Encounter: Payer: Self-pay | Admitting: Internal Medicine

## 2024-02-10 ENCOUNTER — Ambulatory Visit: Payer: BC Managed Care – PPO | Admitting: Internal Medicine

## 2024-02-10 ENCOUNTER — Ambulatory Visit

## 2024-02-10 VITALS — BP 128/80 | HR 78 | Ht 67.0 in | Wt 255.0 lb

## 2024-02-10 DIAGNOSIS — R0609 Other forms of dyspnea: Secondary | ICD-10-CM | POA: Diagnosis not present

## 2024-02-10 NOTE — Patient Instructions (Addendum)
 Please remember to go to the  x-ray department  for your tests - we will call you with the results when they are available    Make sure you check your oxygen saturation at your highest level of activity(NOT after you stop)  to be sure it stays over 90% and keep track of it at least once a week, more often if breathing getting worse, and let me know if losing ground. (Collect the dots to connect the dots approach)    Please schedule a follow up visit in 3 months to [place] office  but call sooner if needed with pfts at [facility] in meantime

## 2024-02-10 NOTE — Assessment & Plan Note (Signed)
 Onset 07/2023 in pt with MCTD since spring of 2024  - 02/10/2024   @ 255 lb Walked on RA  x  3  lap(s) =  approx 750  ft  @ mod pace, stopped due to end of study  with lowest 02 sats 94% increased to 96% before stopping    She needs pfts and if abnormal or sats drop over time at peak ex > HRCT  chest though rx in the setting of MCTD is focused usually on the underlying rheaumatologic problem, which is presently in good hands with Dr Corliss Skains.   F/u will be in 3 months in [facility] with pfts next available.   Discussed in detail all the  indications, usual  risks and alternatives  relative to the benefits with patient who agrees to proceed with w/u as outlined.     Each maintenance medication was reviewed in detail including emphasizing most importantly the difference between maintenance and prns and under what circumstances the prns are to be triggered using an action plan format where appropriate.  Total time for H and P, chart review, counseling,  directly observing portions of ambulatory 02 saturation study/ and generating customized AVS unique to this office visit / same day charting = 45 min new pt eval

## 2024-02-10 NOTE — Progress Notes (Signed)
 Office Visit Note  Patient: Lindsay Sanchez             Date of Birth: Mar 24, 1971           MRN: [id number]             PCP: Karie Georges, MD Referring: Karie Georges, MD Visit Date: 02/24/2024 Occupation: @GUAROCC @  Subjective:  Medication monitoring   History of Present Illness: Lindsay Sanchez is a 53 y.o. female with history of mixed connective tissue disease and osteoarthritis.  She was initiated on plaquenil 200 mg 1 tablet by mouth twice daily after the last office visit on 11/25/23.  She has been tolerating Plaquenil without any side effects.  Patient states that she has noticed less sensitivity and swelling involving both feet since initiating Plaquenil.  Patient states that the plantar fasciitis affecting her left foot has improved as well.  She uses a brace at night as needed.  She continues to have chronic pain and stiffness in the cervical spine as well as ongoing discomfort in her right knee.  She has also noticed a buckling sensation in her right knee at times.  She has been trying to wear a knee brace especially if having to climb steps or walking prolonged distances. Patient continues to have chronic sicca symptoms.  She has been using eyedrops twice a day and XyliMelts 3 times daily for symptomatic relief.  She denies any oral or nasal ulcerations.  She continues to see the dentist every 6 months and has an upcoming appointment at [facility] care on 03/12/2024.  She continues have intermittent symptoms of Raynaud's phenomenon but no digital ulcerations.  Activities of Daily Living:  Patient reports morning stiffness for 45-60 minutes.   Patient Reports nocturnal pain.  Difficulty dressing/grooming: Denies Difficulty climbing stairs: Reports Difficulty getting out of chair: Reports Difficulty using hands for taps, buttons, cutlery, and/or writing: Denies  Review of Systems  Constitutional:  Positive for fatigue.  HENT:  Positive for mouth dryness and nose dryness.  Negative for mouth sores.   Eyes:  Positive for dryness. Negative for pain.  Respiratory:  Negative for shortness of breath and difficulty breathing.   Cardiovascular:  Negative for chest pain and palpitations.  Gastrointestinal:  Positive for constipation. Negative for blood in stool and diarrhea.  Endocrine: Negative for increased urination.  Genitourinary:  Negative for involuntary urination.  Musculoskeletal:  Positive for joint pain, gait problem, joint pain, joint swelling, myalgias, morning stiffness, muscle tenderness and myalgias. Negative for muscle weakness.  Skin:  Positive for rash. Negative for color change, hair loss and sensitivity to sunlight.  Allergic/Immunologic: Negative for susceptible to infections.  Neurological:  Positive for headaches. Negative for dizziness.  Hematological:  Negative for swollen glands.  Psychiatric/Behavioral:  Positive for sleep disturbance. Negative for depressed mood. The patient is not nervous/anxious.     PMFS History:  Patient Active Problem List   Diagnosis Date Noted   DOE (dyspnea on exertion) 02/10/2024   Ear pain 04/04/2023   Elevated blood pressure reading 04/04/2023   Allergic rhinitis 06/18/2022   Mild intermittent asthma without complication 07/25/2020   Depression, recurrent (HCC) 07/25/2020   Morbid obesity (HCC) 03/08/2016   Meralgia paresthetica of left side 12/12/2015   Obesity 09/27/2012   Family history of coronary artery disease 09/27/2012   Controlled type 2 diabetes mellitus with complication, without long-term current use of insulin (HCC)     Past Medical History:  Diagnosis Date   Anxiety  anxiety attacks are infrequent   Asthma    Chest tightness    Diabetes (HCC)    Dry mouth    patient states her dentist diagnosed iwth dry mouth-uses Xylitol tabs qhs   Macular degeneration    per patient-treated by Dr Dione Booze   Meralgia paresthetica of left side 12/12/2015   Obesity 09/27/2012   Urinary frequency      Family History  Problem Relation Age of Onset   Arrhythmia Mother        V TACH   Ovarian cancer Mother    Breast cancer Mother 40   Atrial fibrillation Mother    Heart attack Father 41   Sudden Cardiac Death Brother    Heart attack Brother    Healthy Brother    Breast cancer Maternal Aunt    Breast cancer Maternal Aunt    Heart attack Paternal Uncle    Diabetes Paternal Uncle    Heart attack Maternal Grandfather    Cancer Maternal Grandfather    Heart disease Paternal Grandmother    Anemia Daughter    Obesity Daughter    Other Daughter        polyps in uterus   Epilepsy Son        as a child   Cancer Cousin    Heart attack Cousin    Heart attack Cousin    Heart attack Cousin    Heart attack Cousin    Heart attack Cousin    Past Surgical History:  Procedure Laterality Date   ABDOMINAL HYSTERECTOMY     precancer cells on pap smear; thinks left ovar remains   CESAREAN SECTION     FOOT SURGERY Right    cyst removal   HAMMERTOE RECONSTRUCTION WITH WEIL OSTEOTOMY Right 12/25/2015   Procedure: RIGHT THIRD HAMMERTOE CORRECTION, WEIL OSTEOTOMY;  Surgeon: Toni Arthurs, MD;  Location: [facility];  Service: Orthopedics;  Laterality: Right;   HAND SURGERY Bilateral    HARDWARE REMOVAL Right 12/25/2015   Procedure: RIGHT FIFTH METATARSAL DEEP IMPLANT REMOVAL, EXOSTECTOMY;  Surgeon: Toni Arthurs, MD;  Location: [facility];  Service: Orthopedics;  Laterality: Right;   MOUTH SURGERY     [facility]--removal of cyst per patient-awaiting path results   NECK SURGERY     cervical fusion   ROOT CANAL     TONSILLECTOMY     Social History   Social History Narrative   Patient drinks about 1 cup of caffeine daily.   Patient is right handed.    Immunization History  Administered Date(s) Administered   Influenza,inj,Quad PF,6+ Mos 08/25/2018, 08/08/2020, 08/14/2021, 08/27/2022   Influenza-Unspecified 08/31/2020, 09/01/2022, 07/22/2023    Moderna Covid-19 Vaccine Bivalent Booster 71yrs & up 07/22/2023   PFIZER(Purple Top)SARS-COV-2 Vaccination 04/25/2020, 05/22/2020, 11/30/2020, 11/30/2020   Pneumococcal Conjugate,unspecified 12/29/2022   Pneumococcal Polysaccharide-23 07/20/2018   Td 07/20/2018   Zoster Recombinant(Shingrix) 03/22/2022, 06/18/2022, 06/29/2022, 12/28/2022     Objective: Vital Signs: BP 117/84 (BP Location: Left Arm, Patient Position: Sitting, Cuff Size: Large)   Pulse 85   Resp 16   Ht 5\' 7"  (1.702 m)   Wt 251 lb 3.2 oz (113.9 kg)   BMI 39.34 kg/m    Physical Exam Vitals and nursing note reviewed.  Constitutional:      Appearance: She is well-developed.  HENT:     Head: Normocephalic and atraumatic.  Eyes:     Conjunctiva/sclera: Conjunctivae normal.  Cardiovascular:     Rate and Rhythm: Normal rate and regular rhythm.  Heart sounds: Normal heart sounds.  Pulmonary:     Effort: Pulmonary effort is normal.     Breath sounds: Normal breath sounds.  Abdominal:     General: Bowel sounds are normal.     Palpations: Abdomen is soft.  Musculoskeletal:     Cervical back: Normal range of motion.  Lymphadenopathy:     Cervical: No cervical adenopathy.  Skin:    General: Skin is warm and dry.     Capillary Refill: Capillary refill takes less than 2 seconds.  Neurological:     Mental Status: She is alert and oriented to person, place, and time.  Psychiatric:        Behavior: Behavior normal.      Musculoskeletal Exam: C-spine has limited range of motion with lateral rotation.  Shoulder joints have good range of motion.  Elbow joints, wrist joints, MCPs, PIPs, DIPs have good range of motion with no synovitis.  PIP and DIP thickening noted.  Hip joints have good range of motion with no groin pain.  Discomfort with range of motion of the right knee but no effusion noted.  Warmth in the right knee noted.  Left knee joint has good range of motion with no warmth or effusion.  Ankle joints have good  range of motion with no tenderness or joint swelling.  CDAI Exam: CDAI Score: -- Patient Global: --; Provider Global: -- Swollen: --; Tender: -- Joint Exam 02/24/2024   No joint exam has been documented for this visit   There is currently no information documented on the homunculus. Go to the Rheumatology activity and complete the homunculus joint exam.  Investigation: No additional findings.  Imaging: DG Chest 2 View Result Date: 02/23/2024 CLINICAL DATA:  Dyspnea on exertion. EXAM: CHEST - 2 VIEW COMPARISON:  03/08/2016. FINDINGS: Normal heart, mediastinum and hila. Clear lungs.  No pleural effusion or pneumothorax. Skeletal structures are unremarkable. IMPRESSION: No active cardiopulmonary disease. Electronically Signed   By: Amie Portland M.D.   On: 02/23/2024 08:17    Recent Labs: Lab Results  Component Value Date   WBC 5.4 01/02/2024   HGB 12.3 01/02/2024   PLT 191 01/02/2024   NA 142 01/02/2024   K 5.1 01/02/2024   CL 106 01/02/2024   CO2 28 01/02/2024   GLUCOSE 100 (H) 01/02/2024   BUN 15 01/02/2024   CREATININE 0.73 01/02/2024   BILITOT 0.3 01/02/2024   ALKPHOS 68 05/23/2023   AST 16 01/02/2024   ALT 15 01/02/2024   PROT 6.7 01/02/2024   ALBUMIN 4.4 05/23/2023   CALCIUM 9.1 01/02/2024   GFRAA >60 12/19/2015    Speciality Comments: No specialty comments available.  Procedures:  No procedures performed Allergies: Bee venom, Actos [pioglitazone], Jardiance [empagliflozin], Metformin and related, Other, and Adhesive [tape]     Assessment / Plan:     Visit Diagnoses: MCTD (mixed connective tissue disease) (HCC) - MCTD (mixed connective tissue disease) (HCC) - ANA negative, positive RNP, positive SSA, positive SSB, sicca symptoms, fatigue, hair loss, Raynaud's: The patient was initiated on Plaquenil 200 mg 1 tablet by mouth twice daily after her last office visit on 11/25/2023.  She has been tolerating Plaquenil without any side effects.  She has noticed less  sensitivity and inflammation in both feet since initiating Plaquenil.  She continues to have chronic pain and stiffness involving the cervical spine and the right knee.  She has had ongoing sicca symptoms and is using over-the-counter products for symptomatic relief.  No oral or nasal  ulcerations.  She has mild symptoms of Raynaud's phenomenon but no digital ulcerations or signs of sclerodactyly were noted on examination today.  Overall she has not noticed significant improvement in her symptoms yet since initiating Plaquenil but is willing to give it more time.  She has established care with Dr. [place] (cardiology) and Dr. Sherene Sires (pulmonology).  Reviewed Dr. Elvis Coil office visit note from 12/19/2023: No arrhythmia noted.  QTc normal.  Baseline echo 01/06/24. Reviewed Dr. Thurston Hole office visit note from 02/10/2024: Planning PFTs and HRCT if abnormal.   She will follow-up in the office in 2 to 3 months or sooner if needed. The following lab work will be obtained today for further evaluation. She will remain on plaquenil as prescribed.   - Plan: Protein / creatinine ratio, urine, CBC with Differential/Platelet, Comprehensive metabolic panel with GFR, Sedimentation rate, C3 and C4, Anti-DNA antibody, double-stranded, ANA, RNP Antibody, Sjogrens syndrome-B extractable nuclear antibody, Sjogrens syndrome-A extractable nuclear antibody  High risk medication use - Plaquenil 200 mg 1 tablet by mouth twice daily-started early January 2025.  CBC and CMP updated on 01/02/24. Orders for CBC and CMP released today.  No baseline plaquenil eye examination on file.  Scheduled for Plaquenil eye examination at [facility] eye care on 03/12/2024.  - Plan: CBC with Differential/Platelet, Comprehensive metabolic panel with GFR  Raynaud's syndrome without gangrene: Mild-No digital ulcerations or signs of gangrene.    Primary osteoarthritis of both hands: She has PIP and DIP thickening consistent with osteoarthritis of both hands.  No  synovitis noted on examination today.  History of bilateral carpal tunnel release - Status post surgery several years ago.   Primary osteoarthritis of both knees: Patient continues to have ongoing discomfort involving both knees, right greater than left.  She has been using a hinged brace on the right knee for stability.  Patient plans on following back up at [facility] for further evaluation and to discuss treatment options.  Primary osteoarthritis of both feet - Surgery on her right first third and fifth toes by Dr. Ulice Brilliant and Dr. Victorino Dike.  She is followed by Dr. Ulice Brilliant now.  Plantar fasciitis of right foot - plantar fascia surgery by Dr. Victorino Dike.  Right foot x-ray January 30, 2021 showed mild degenerative changes and postsurgical changes  Neck pain - She presents today with increased neck pain and stiffness.  She has limited lateral rotation. No symptoms of radiculopathy.  She is not currently followed by a neurosurgeon.  Offered a referral to neurosurgery and/or physical therapy but she would like to hold off at this time.  Patient requested to have x-rays of the cervical spine today for further evaluation.  She was given a handout of exercises to perform.   Plan: XR Cervical Spine 2 or 3 views  DDD (degenerative disc disease), cervical - Status post fusion Dr. Thelma Barge.Patient presents today with increased pain and stiffness in the C-spine.  X-rays of the cervical spine were updated today as requested.  Offered a referral to a spine specialist that she declined.  Offered referral to PT.  Patient would like to try home exercises.  Lumbar spondylosis: Chronic pain and stiffness.    Other medical conditions are listed as follows:   Myalgia  Other fatigue  Meralgia paresthetica of left side  Controlled type 2 diabetes mellitus with complication, without long-term current use of insulin (HCC)  Mild intermittent asthma without complication  Seasonal allergic rhinitis due to other  allergic trigger  Anxiety and depression  Family history of  coronary artery disease   Orders: Orders Placed This Encounter  Procedures   XR Cervical Spine 2 or 3 views   Protein / creatinine ratio, urine   CBC with Differential/Platelet   Comprehensive metabolic panel with GFR   Sedimentation rate   C3 and C4   Anti-DNA antibody, double-stranded   ANA   RNP Antibody   Sjogrens syndrome-B extractable nuclear antibody   Sjogrens syndrome-A extractable nuclear antibody   No orders of the defined types were placed in this encounter.    Follow-Up Instructions: Return in about 3 months (around 05/25/2024) for MCTD.   Gearldine Bienenstock, PA-C  Note - This record has been created using Dragon software.  Chart creation errors have been sought, but may not always  have been located. Such creation errors do not reflect on  the standard of medical care.

## 2024-02-14 ENCOUNTER — Other Ambulatory Visit: Payer: Self-pay | Admitting: Family Medicine

## 2024-02-14 DIAGNOSIS — E118 Type 2 diabetes mellitus with unspecified complications: Secondary | ICD-10-CM

## 2024-02-18 ENCOUNTER — Other Ambulatory Visit: Payer: Self-pay | Admitting: Rheumatology

## 2024-02-18 DIAGNOSIS — M351 Other overlap syndromes: Secondary | ICD-10-CM

## 2024-02-20 NOTE — Telephone Encounter (Signed)
 Last Fill: 11/25/2023  Eye exam: not on file Patient has it scheduled for April 2025 due to insurance.   Labs: 01/02/2024 CBC and CMP are normal.   Next Visit: 02/24/2024  Last Visit: 11/25/2023  DX: MCTD (mixed connective tissue disease)   Current Dose per office note 11/25/2023: Plaquenil 200 mg p.o. twice a day  Okay to refill Plaquenil?

## 2024-02-24 ENCOUNTER — Ambulatory Visit

## 2024-02-24 ENCOUNTER — Encounter: Payer: Self-pay | Admitting: Physician Assistant

## 2024-02-24 ENCOUNTER — Ambulatory Visit: Payer: BC Managed Care – PPO | Attending: Physician Assistant | Admitting: Physician Assistant

## 2024-02-24 VITALS — BP 117/84 | HR 85 | Resp 16 | Ht 67.0 in | Wt 251.2 lb

## 2024-02-24 DIAGNOSIS — F32A Depression, unspecified: Secondary | ICD-10-CM

## 2024-02-24 DIAGNOSIS — M170 Bilateral primary osteoarthritis of knee: Secondary | ICD-10-CM

## 2024-02-24 DIAGNOSIS — E118 Type 2 diabetes mellitus with unspecified complications: Secondary | ICD-10-CM

## 2024-02-24 DIAGNOSIS — M542 Cervicalgia: Secondary | ICD-10-CM

## 2024-02-24 DIAGNOSIS — M19041 Primary osteoarthritis, right hand: Secondary | ICD-10-CM | POA: Diagnosis not present

## 2024-02-24 DIAGNOSIS — I7300 Raynaud's syndrome without gangrene: Secondary | ICD-10-CM

## 2024-02-24 DIAGNOSIS — J3089 Other allergic rhinitis: Secondary | ICD-10-CM

## 2024-02-24 DIAGNOSIS — G5712 Meralgia paresthetica, left lower limb: Secondary | ICD-10-CM

## 2024-02-24 DIAGNOSIS — M351 Other overlap syndromes: Secondary | ICD-10-CM

## 2024-02-24 DIAGNOSIS — M5030 Other cervical disc degeneration, unspecified cervical region: Secondary | ICD-10-CM

## 2024-02-24 DIAGNOSIS — M791 Myalgia, unspecified site: Secondary | ICD-10-CM

## 2024-02-24 DIAGNOSIS — J4520 Mild intermittent asthma, uncomplicated: Secondary | ICD-10-CM

## 2024-02-24 DIAGNOSIS — Z8249 Family history of ischemic heart disease and other diseases of the circulatory system: Secondary | ICD-10-CM

## 2024-02-24 DIAGNOSIS — Z79899 Other long term (current) drug therapy: Secondary | ICD-10-CM | POA: Diagnosis not present

## 2024-02-24 DIAGNOSIS — F419 Anxiety disorder, unspecified: Secondary | ICD-10-CM

## 2024-02-24 DIAGNOSIS — M722 Plantar fascial fibromatosis: Secondary | ICD-10-CM

## 2024-02-24 DIAGNOSIS — R5383 Other fatigue: Secondary | ICD-10-CM

## 2024-02-24 DIAGNOSIS — M19042 Primary osteoarthritis, left hand: Secondary | ICD-10-CM

## 2024-02-24 DIAGNOSIS — M47816 Spondylosis without myelopathy or radiculopathy, lumbar region: Secondary | ICD-10-CM

## 2024-02-24 DIAGNOSIS — M19072 Primary osteoarthritis, left ankle and foot: Secondary | ICD-10-CM

## 2024-02-24 DIAGNOSIS — Z9889 Other specified postprocedural states: Secondary | ICD-10-CM

## 2024-02-24 DIAGNOSIS — M19071 Primary osteoarthritis, right ankle and foot: Secondary | ICD-10-CM

## 2024-02-24 NOTE — Progress Notes (Signed)
 X-ray of the cervical spine is consistent with multilevel spondylosis with C5-C6 fusion

## 2024-02-24 NOTE — Patient Instructions (Signed)
 Neck Exercises Ask your health care provider which exercises are safe for you. Do exercises exactly as told by your health care provider and adjust them as directed. It is normal to feel mild stretching, pulling, tightness, or discomfort as you do these exercises. Stop right away if you feel sudden pain or your pain gets worse. Do not begin these exercises until told by your health care provider. Neck exercises can be important for many reasons. They can improve strength and maintain flexibility in your neck, which will help your upper back and prevent neck pain. Stretching exercises Rotation neck stretching  Sit in a chair or stand up. Place your feet flat on the floor, shoulder-width apart. Slowly turn your head (rotate) to the right until a slight stretch is felt. Turn it all the way to the right so you can look over your right shoulder. Do not tilt or tip your head. Hold this position for 10-30 seconds. Slowly turn your head (rotate) to the left until a slight stretch is felt. Turn it all the way to the left so you can look over your left shoulder. Do not tilt or tip your head. Hold this position for 10-30 seconds. Repeat __________ times. Complete this exercise __________ times a day. Neck retraction  Sit in a sturdy chair or stand up. Look straight ahead. Do not bend your neck. Use your fingers to push your chin backward (retraction). Do not bend your neck for this movement. Continue to face straight ahead. If you are doing the exercise properly, you will feel a slight sensation in your throat and a stretch at the back of your neck. Hold the stretch for 1-2 seconds. Repeat __________ times. Complete this exercise __________ times a day. Strengthening exercises Neck press  Lie on your back on a firm bed or on the floor with a pillow under your head. Use your neck muscles to push your head down on the pillow and straighten your spine. Hold the position as well as you can. Keep your head  facing up (in a neutral position) and your chin tucked. Slowly count to 5 while holding this position. Repeat __________ times. Complete this exercise __________ times a day. Isometrics These are exercises in which you strengthen the muscles in your neck while keeping your neck still (isometrics). Sit in a supportive chair and place your hand on your forehead. Keep your head and face facing straight ahead. Do not flex or extend your neck while doing isometrics. Push forward with your head and neck while pushing back with your hand. Hold for 10 seconds. Do the sequence again, this time putting your hand against the back of your head. Use your head and neck to push backward against the hand pressure. Finally, do the same exercise on either side of your head, pushing sideways against the pressure of your hand. Repeat __________ times. Complete this exercise __________ times a day. Prone head lifts  Lie face-down (prone position), resting on your elbows so that your chest and upper back are raised. Start with your head facing downward, near your chest. Position your chin either on or near your chest. Slowly lift your head upward. Lift until you are looking straight ahead. Then continue lifting your head as far back as you can comfortably stretch. Hold your head up for 5 seconds. Then slowly lower it to your starting position. Repeat __________ times. Complete this exercise __________ times a day. Supine head lifts  Lie on your back (supine position), bending your knees  to point to the ceiling and keeping your feet flat on the floor. Lift your head slowly off the floor, raising your chin toward your chest. Hold for 5 seconds. Repeat __________ times. Complete this exercise __________ times a day. Scapular retraction  Stand with your arms at your sides. Look straight ahead. Slowly pull both shoulders (scapulae) backward and downward (retraction) until you feel a stretch between your shoulder  blades in your upper back. Hold for 10-30 seconds. Relax and repeat. Repeat __________ times. Complete this exercise __________ times a day. Contact a health care provider if: Your neck pain or discomfort gets worse when you do an exercise. Your neck pain or discomfort does not improve within 2 hours after you exercise. If you have any of these problems, stop exercising right away. Do not do the exercises again unless your health care provider says that you can. Get help right away if: You develop sudden, severe neck pain. If this happens, stop exercising right away. Do not do the exercises again unless your health care provider says that you can. This information is not intended to replace advice given to you by your health care provider. Make sure you discuss any questions you have with your health care provider. Document Revised: 05/05/2021 Document Reviewed: 05/05/2021 Elsevier Patient Education  2024 [employer].

## 2024-02-25 LAB — CBC WITH DIFFERENTIAL/PLATELET
Absolute Lymphocytes: 1325 {cells}/uL (ref 850–3900)
Absolute Monocytes: 446 {cells}/uL (ref 200–950)
Basophils Absolute: 38 {cells}/uL (ref 0–200)
Basophils Relative: 0.8 %
Eosinophils Absolute: 48 {cells}/uL (ref 15–500)
Eosinophils Relative: 1 %
HCT: 36.9 % (ref 35.0–45.0)
Hemoglobin: 12.3 g/dL (ref 11.7–15.5)
MCH: 30 pg (ref 27.0–33.0)
MCHC: 33.3 g/dL (ref 32.0–36.0)
MCV: 90 fL (ref 80.0–100.0)
MPV: 10.6 fL (ref 7.5–12.5)
Monocytes Relative: 9.3 %
Neutro Abs: 2942 {cells}/uL (ref 1500–7800)
Neutrophils Relative %: 61.3 %
Platelets: 199 10*3/uL (ref 140–400)
RBC: 4.1 10*6/uL (ref 3.80–5.10)
RDW: 12.7 % (ref 11.0–15.0)
Total Lymphocyte: 27.6 %
WBC: 4.8 10*3/uL (ref 3.8–10.8)

## 2024-02-25 LAB — RNP ANTIBODY: Ribonucleic Protein(ENA) Antibody, IgG: 1.2 AI — AB

## 2024-02-25 LAB — ANA: Anti Nuclear Antibody (ANA): NEGATIVE

## 2024-02-25 LAB — COMPREHENSIVE METABOLIC PANEL WITH GFR
AG Ratio: 1.8 (calc) (ref 1.0–2.5)
ALT: 24 U/L (ref 6–29)
AST: 26 U/L (ref 10–35)
Albumin: 4.5 g/dL (ref 3.6–5.1)
Alkaline phosphatase (APISO): 71 U/L (ref 37–153)
BUN: 19 mg/dL (ref 7–25)
CO2: 28 mmol/L (ref 20–32)
Calcium: 9.5 mg/dL (ref 8.6–10.4)
Chloride: 106 mmol/L (ref 98–110)
Creat: 0.8 mg/dL (ref 0.50–1.03)
Globulin: 2.5 g/dL (ref 1.9–3.7)
Glucose, Bld: 105 mg/dL — ABNORMAL HIGH (ref 65–99)
Potassium: 4.8 mmol/L (ref 3.5–5.3)
Sodium: 142 mmol/L (ref 135–146)
Total Bilirubin: 0.4 mg/dL (ref 0.2–1.2)
Total Protein: 7 g/dL (ref 6.1–8.1)
eGFR: 87 mL/min/{1.73_m2} (ref >=60)

## 2024-02-25 LAB — PROTEIN / CREATININE RATIO, URINE
Creatinine, Urine: 147 mg/dL (ref 20–275)
Protein/Creat Ratio: 61 mg/g{creat} (ref 24–184)
Protein/Creatinine Ratio: 0.061 mg/mg{creat} (ref 0.024–0.184)
Total Protein, Urine: 9 mg/dL (ref 5–24)

## 2024-02-25 LAB — C3 AND C4
C3 Complement: 145 mg/dL (ref 83–193)
C4 Complement: 17 mg/dL (ref 15–57)

## 2024-02-25 LAB — ANTI-DNA ANTIBODY, DOUBLE-STRANDED: ds DNA Ab: 2 [IU]/mL

## 2024-02-25 LAB — SJOGRENS SYNDROME-A EXTRACTABLE NUCLEAR ANTIBODY: SSA (Ro) (ENA) Antibody, IgG: <1 AI

## 2024-02-25 LAB — SEDIMENTATION RATE: Sed Rate: 9 mm/h (ref 0–30)

## 2024-02-25 LAB — SJOGRENS SYNDROME-B EXTRACTABLE NUCLEAR ANTIBODY: SSB (La) (ENA) Antibody, IgG: <1 AI

## 2024-02-25 NOTE — Progress Notes (Signed)
 CBC and CMP WNL.  ESR WNL Urine protein creatinine ratio WNL Complements WNL

## 2024-02-26 NOTE — Progress Notes (Signed)
 RNP remains positive.    dsDNA negative  ANA negative  Ro and La antibodies negative.  Labs are not consistent with a flare

## 2024-03-12 ENCOUNTER — Telehealth: Payer: Self-pay | Admitting: Internal Medicine

## 2024-03-12 DIAGNOSIS — E119 Type 2 diabetes mellitus without complications: Secondary | ICD-10-CM | POA: Diagnosis not present

## 2024-03-12 DIAGNOSIS — H353131 Nonexudative age-related macular degeneration, bilateral, early dry stage: Secondary | ICD-10-CM | POA: Diagnosis not present

## 2024-03-12 DIAGNOSIS — H1045 Other chronic allergic conjunctivitis: Secondary | ICD-10-CM | POA: Diagnosis not present

## 2024-03-12 DIAGNOSIS — Z79899 Other long term (current) drug therapy: Secondary | ICD-10-CM | POA: Diagnosis not present

## 2024-03-12 DIAGNOSIS — M351 Other overlap syndromes: Secondary | ICD-10-CM | POA: Diagnosis not present

## 2024-03-12 LAB — HM DIABETES EYE EXAM

## 2024-03-12 NOTE — Telephone Encounter (Signed)
 Spoke with patient regarding the rescheduling of the Friday 05/11/24 9:45 am appointment with Dr. Lajean Pike not in the office---rescheduled to Monday 05/21/24 at 1:30 pm---will mail iformatin to patient and she  voiced her understanding

## 2024-03-19 ENCOUNTER — Ambulatory Visit
Admission: RE | Admit: 2024-03-19 | Discharge: 2024-03-19 | Disposition: A | Discharge status: Home / Self Care | Source: Ambulatory Visit | Attending: Family Medicine | Admitting: Family Medicine

## 2024-03-19 DIAGNOSIS — Z1231 Encounter for screening mammogram for malignant neoplasm of breast: Secondary | ICD-10-CM | POA: Diagnosis not present

## 2024-03-19 DIAGNOSIS — Z0000 Encounter for general adult medical examination without abnormal findings: Secondary | ICD-10-CM

## 2024-03-21 ENCOUNTER — Encounter: Payer: Self-pay | Admitting: Family Medicine

## 2024-03-23 ENCOUNTER — Telehealth: Admitting: Family Medicine

## 2024-03-23 DIAGNOSIS — J019 Acute sinusitis, unspecified: Secondary | ICD-10-CM

## 2024-03-23 DIAGNOSIS — J3089 Other allergic rhinitis: Secondary | ICD-10-CM

## 2024-03-23 DIAGNOSIS — B9689 Other specified bacterial agents as the cause of diseases classified elsewhere: Secondary | ICD-10-CM

## 2024-03-23 MED ORDER — AMOXICILLIN-POT CLAVULANATE 875-125 MG PO TABS: 1.0000 | ORAL_TABLET | Freq: Two times a day (BID) | ORAL | 0 refills | Status: DC

## 2024-03-23 MED ORDER — PREDNISONE 20 MG PO TABS: 20.0000 mg | ORAL_TABLET | Freq: Two times a day (BID) | ORAL | 0 refills | Status: AC

## 2024-03-23 NOTE — Progress Notes (Signed)
 Virtual Visit Consent   Lindsay Sanchez, you are scheduled for a virtual visit with a [facility] provider today. Just as with appointments in the office, your consent must be obtained to participate. Your consent will be active for this visit and any virtual visit you may have with one of our providers in the next 365 days. If you have a MyChart account, a copy of this consent can be sent to you electronically.  As this is a virtual visit, video technology does not allow for your provider to perform a traditional examination. This may limit your provider's ability to fully assess your condition. If your provider identifies any concerns that need to be evaluated in person or the need to arrange testing (such as labs, EKG, etc.), we will make arrangements to do so. Although advances in technology are sophisticated, we cannot ensure that it will always work on either your end or our end. If the connection with a video visit is poor, the visit may have to be switched to a telephone visit. With either a video or telephone visit, we are not always able to ensure that we have a secure connection.  By engaging in this virtual visit, you consent to the provision of healthcare and authorize for your insurance to be billed (if applicable) for the services provided during this visit. Depending on your insurance coverage, you may receive a charge related to this service.  I need to obtain your verbal consent now. Are you willing to proceed with your visit today? Lindsay Sanchez has provided verbal consent on 03/23/2024 for a virtual visit (video or telephone). Albertha Huger, FNP  Date: 03/23/2024 10:48 AM   Virtual Visit via Video Note   I, Albertha Huger, connected with  Lindsay Sanchez  ([id number], Jan 18, 1971) on 03/23/24 at 10:45 AM EDT by a video-enabled telemedicine application and verified that I am speaking with the correct person using two identifiers.  Location: Patient: Virtual Visit Location Patient:  Home Provider: Virtual Visit Location Provider: Home Office   I discussed the limitations of evaluation and management by telemedicine and the availability of in person appointments. The patient expressed understanding and agreed to proceed.    History of Present Illness: Lindsay Sanchez is a 53 y.o. who identifies as a female who was assigned female at birth, and is being seen today for sinus pressure and pain, post nasal drainage, no fever, allergies flared, no cough. Sx for 1.5 weeks worsening. Aaron Aas  HPI: HPI  Problems:  Patient Active Problem List   Diagnosis Date Noted   DOE (dyspnea on exertion) 02/10/2024   Ear pain 04/04/2023   Elevated blood pressure reading 04/04/2023   Allergic rhinitis 06/18/2022   Mild intermittent asthma without complication 07/25/2020   Depression, recurrent (HCC) 07/25/2020   Morbid obesity (HCC) 03/08/2016   Meralgia paresthetica of left side 12/12/2015   Obesity 09/27/2012   Family history of coronary artery disease 09/27/2012   Controlled type 2 diabetes mellitus with complication, without long-term current use of insulin (HCC)     Allergies:  Allergies  Allergen Reactions   Bee Venom Anaphylaxis   Actos [Pioglitazone]     Itching everywhere   Jardiance [Empagliflozin]     Excessive urination   Metformin And Related     Diarrhea, intermittent. Leg numbness.   Other     Ragweed and Pollen   Adhesive [Tape] Rash    And itching (when left on for extended time)   Medications:  Current  Outpatient Medications:    Accu-Chek FastClix Lancets MISC, Use as directed, Disp: 102 each, Rfl: 3   albuterol  (PROVENTIL  HFA;VENTOLIN  HFA) 108 (90 Base) MCG/ACT inhaler, Inhale 2 puffs into the lungs every 6 (six) hours as needed. (Patient not taking: Reported on 02/24/2024), Disp: 1 Inhaler, Rfl: 0   Blood Glucose Monitoring Suppl (CONTOUR BLOOD GLUCOSE SYSTEM) w/Device KIT, 1 strip by Does not apply route daily., Disp: 1 kit, Rfl: 0   budesonide -formoterol   (SYMBICORT ) 160-4.5 MCG/ACT inhaler, Inhale 2 puffs into the lungs daily., Disp: , Rfl:    cetirizine (ZYRTEC) 10 MG tablet, Take 10 mg by mouth as needed., Disp: , Rfl:    diclofenac  (VOLTAREN ) 75 MG EC tablet, Take 1 tablet by mouth twice daily, Disp: 60 tablet, Rfl: 5   estradiol  (ESTRACE ) 2 MG tablet, Take 1 tablet (2 mg total) by mouth daily., Disp: 90 tablet, Rfl: 1   glucose blood (ACCU-CHEK GUIDE) test strip, USE TO CHECK BLOOD SUGAR ONCE DAILY IN THE MORNING, Disp: 100 each, Rfl: 3   hydroxychloroquine  (PLAQUENIL ) 200 MG tablet, Take 1 tablet by mouth twice daily, Disp: 60 tablet, Rfl: 0   losartan  (COZAAR ) 25 MG tablet, Take 1 tablet by mouth once daily, Disp: 90 tablet, Rfl: 1   MAGNESIUM PO, Take by mouth daily., Disp: , Rfl:    montelukast  (SINGULAIR ) 10 MG tablet, TAKE 1 TABLET BY MOUTH AT BEDTIME, Disp: 90 tablet, Rfl: 1   Multiple Vitamins-Minerals (PRESERVISION AREDS PO), Take by mouth daily., Disp: , Rfl:    OVER THE COUNTER MEDICATION, Xylitol melts-at bedtime for dry mouth, Disp: , Rfl:    rosuvastatin  (CRESTOR ) 5 MG tablet, Take 1 tablet by mouth once daily, Disp: 90 tablet, Rfl: 1   Spacer/Aero-Holding Chambers (AEROCHAMBER PLUS) inhaler, Use as instructed, Disp: 1 each, Rfl: 0   tirzepatide  (MOUNJARO ) 15 MG/0.5ML Pen, Inject 15 mg into the skin once a week. (Patient not taking: Reported on 02/24/2024), Disp: 6 mL, Rfl: 5   tirzepatide  (MOUNJARO ) 15 MG/0.5ML Pen, INJECT 15 MG SUBCUTANEOUSLY ONCE A WEEK, Disp: 6 mL, Rfl: 1   VITAMIN D PO, Take 5,000 Units by mouth daily., Disp: , Rfl:   Observations/Objective: Patient is well-developed, well-nourished in no acute distress.  Resting comfortably  at home.  Head is normocephalic, atraumatic.  No labored breathing.  Speech is clear and coherent with logical content.  Patient is alert and oriented at baseline.    Assessment and Plan: 1. Acute bacterial sinusitis (Primary)  2. Seasonal allergic rhinitis due to other allergic  trigger  Increase fluids, continue zyrtec, singulair , and flonase, UC if sx worsen.  Follow Up Instructions: I discussed the assessment and treatment plan with the patient. The patient was provided an opportunity to ask questions and all were answered. The patient agreed with the plan and demonstrated an understanding of the instructions.  A copy of instructions were sent to the patient via MyChart unless otherwise noted below.     The patient was advised to call back or seek an in-person evaluation if the symptoms worsen or if the condition fails to improve as anticipated.    Nazli Penn, FNP

## 2024-03-23 NOTE — Patient Instructions (Signed)

## 2024-04-02 DIAGNOSIS — M62830 Muscle spasm of back: Secondary | ICD-10-CM | POA: Diagnosis not present

## 2024-04-02 DIAGNOSIS — M9901 Segmental and somatic dysfunction of cervical region: Secondary | ICD-10-CM | POA: Diagnosis not present

## 2024-04-02 DIAGNOSIS — M9903 Segmental and somatic dysfunction of lumbar region: Secondary | ICD-10-CM | POA: Diagnosis not present

## 2024-04-02 DIAGNOSIS — M9902 Segmental and somatic dysfunction of thoracic region: Secondary | ICD-10-CM | POA: Diagnosis not present

## 2024-04-10 ENCOUNTER — Ambulatory Visit (HOSPITAL_COMMUNITY)
Admission: RE | Admit: 2024-04-10 | Discharge: 2024-04-10 | Disposition: A | Discharge status: Home / Self Care | Source: Ambulatory Visit | Attending: Internal Medicine | Admitting: Internal Medicine

## 2024-04-10 DIAGNOSIS — R0609 Other forms of dyspnea: Secondary | ICD-10-CM | POA: Insufficient documentation

## 2024-04-10 LAB — PULMONARY FUNCTION TEST
DL/VA % pred: 96 %
DL/VA: 4.02 ml/min/mmHg/L
DLCO unc % pred: 72 %
DLCO unc: 16.38 ml/min/mmHg
FEF 25-75 Post: 2.66 L/s
FEF 25-75 Pre: 2.01 L/s
FEF2575-%Change-Post: 32 %
FEF2575-%Pred-Post: 97 %
FEF2575-%Pred-Pre: 73 %
FEV1-%Change-Post: 6 %
FEV1-%Pred-Post: 87 %
FEV1-%Pred-Pre: 81 %
FEV1-Post: 2.6 L
FEV1-Pre: 2.43 L
FEV1FVC-%Change-Post: 2 %
FEV1FVC-%Pred-Pre: 96 %
FEV6-%Change-Post: 3 %
FEV6-%Pred-Post: 89 %
FEV6-%Pred-Pre: 86 %
FEV6-Post: 3.29 L
FEV6-Pre: 3.18 L
FEV6FVC-%Change-Post: 0 %
FEV6FVC-%Pred-Post: 102 %
FEV6FVC-%Pred-Pre: 103 %
FVC-%Change-Post: 4 %
FVC-%Pred-Post: 86 %
FVC-%Pred-Pre: 83 %
FVC-Post: 3.31 L
FVC-Pre: 3.18 L
Post FEV1/FVC ratio: 78 %
Post FEV6/FVC ratio: 99 %
Pre FEV1/FVC ratio: 77 %
Pre FEV6/FVC Ratio: 100 %
RV % pred: 93 %
RV: 1.91 L
TLC % pred: 95 %
TLC: 5.25 L

## 2024-04-10 MED ORDER — ALBUTEROL SULFATE (2.5 MG/3ML) 0.083% IN NEBU
2.5000 mg | INHALATION_SOLUTION | Freq: Once | RESPIRATORY_TRACT | Status: AC
  Administered 2024-04-10: 2.5 mg via RESPIRATORY_TRACT

## 2024-04-16 ENCOUNTER — Ambulatory Visit: Payer: Self-pay | Admitting: Internal Medicine

## 2024-04-17 ENCOUNTER — Other Ambulatory Visit: Payer: Self-pay | Admitting: Family Medicine

## 2024-04-17 DIAGNOSIS — E118 Type 2 diabetes mellitus with unspecified complications: Secondary | ICD-10-CM

## 2024-04-20 ENCOUNTER — Other Ambulatory Visit: Payer: Self-pay | Admitting: Rheumatology

## 2024-04-20 DIAGNOSIS — M170 Bilateral primary osteoarthritis of knee: Secondary | ICD-10-CM | POA: Diagnosis not present

## 2024-04-20 DIAGNOSIS — G8929 Other chronic pain: Secondary | ICD-10-CM | POA: Diagnosis not present

## 2024-04-20 DIAGNOSIS — M351 Other overlap syndromes: Secondary | ICD-10-CM

## 2024-04-20 NOTE — Telephone Encounter (Signed)
 Last Fill: 02/20/2024  Eye exam: 03/12/2024 Normal   Labs: 02/24/2024 CBC and CMP WNL.   Next Visit: 05/28/2024  Last Visit: 02/24/2024  EA:VWUJ (mixed connective tissue disease)   Current Dose per office note 02/24/2024: Plaquenil  200 mg 1 tablet by mouth twice   Okay to refill Plaquenil ?

## 2024-04-30 DIAGNOSIS — Z01419 Encounter for gynecological examination (general) (routine) without abnormal findings: Secondary | ICD-10-CM | POA: Diagnosis not present

## 2024-04-30 DIAGNOSIS — Z6841 Body Mass Index (BMI) 40.0 and over, adult: Secondary | ICD-10-CM | POA: Diagnosis not present

## 2024-04-30 DIAGNOSIS — Z13820 Encounter for screening for osteoporosis: Secondary | ICD-10-CM | POA: Diagnosis not present

## 2024-05-11 ENCOUNTER — Ambulatory Visit: Admitting: Internal Medicine

## 2024-05-13 ENCOUNTER — Other Ambulatory Visit: Payer: Self-pay | Admitting: Family Medicine

## 2024-05-13 DIAGNOSIS — R030 Elevated blood-pressure reading, without diagnosis of hypertension: Secondary | ICD-10-CM

## 2024-05-13 DIAGNOSIS — E118 Type 2 diabetes mellitus with unspecified complications: Secondary | ICD-10-CM

## 2024-05-14 NOTE — Progress Notes (Signed)
 Office Visit Note  Patient: Lindsay Sanchez             Date of Birth: November 14, 1971           MRN: [id number]             PCP: Ozell Heron HERO, MD Referring: Ozell Heron HERO, MD Visit Date: 05/28/2024 Occupation: @GUAROCC @  Subjective:  Medication monitoring  History of Present Illness: Lindsay Sanchez is a 53 y.o. female with history of mixed connective tissue disease and osteoarthritis.  Patient is currently taking Plaquenil  200 mg 1 tablet by mouth twice daily.  She is tolerating Plaquenil  without any side effects and has not had any recent gaps in therapy.  She has not noticed any clinical benefit since initiating Plaquenil  in January 2025.  She had updated Plaquenil  examination on 03/12/2024 which was normal.  Patient states that she was also evaluated by Dr. Darlean on 05/21/2024 and did not have any evidence of interstitial lung disease. Patient states that her primary concern remains her level of fatigue, brain fog, and joint pain.  She continues to have severe pain involving both knee joints.  She has been having difficulty climbing steps and rising from a seated position.  Patient has difficulty sitting due to pain she experiences while flexing both knees.  She also has difficulty standing or walking for long distances due to the severity of discomfort.  Patient states that she has been experiencing some increased neck pain and stiffness.  Patient states that with positional changes she has paresthesias extending from her neck down her left scapula.  She has not followed back up with her neurologist or neurosurgeon.  She previously had a cervical spinal fusion about 10 years ago.  Patient states that she has also been seeing a chiropractor Dr. Jalene.  Patient is currently in the process of filing for disability due to being unable to work due to the severity of pain she experiences.   Activities of Daily Living:  Patient reports morning stiffness for 45-60 minutes.   Patient Denies  nocturnal pain.  Difficulty dressing/grooming: Denies Difficulty climbing stairs: Reports Difficulty getting out of chair: Reports Difficulty using hands for taps, buttons, cutlery, and/or writing: Denies  Review of Systems  Constitutional:  Positive for fatigue.  HENT:  Positive for mouth dryness. Negative for mouth sores.   Eyes:  Positive for dryness.  Respiratory:  Negative for shortness of breath.   Cardiovascular:  Negative for chest pain and palpitations.  Gastrointestinal:  Positive for constipation. Negative for blood in stool and diarrhea.  Endocrine: Negative for increased urination.  Genitourinary:  Negative for involuntary urination.  Musculoskeletal:  Positive for joint pain, joint pain, myalgias, muscle weakness, morning stiffness, muscle tenderness and myalgias. Negative for gait problem and joint swelling.  Skin:  Positive for color change and sensitivity to sunlight. Negative for rash and hair loss.  Allergic/Immunologic: Negative for susceptible to infections.  Neurological:  Positive for headaches. Negative for dizziness.  Hematological:  Negative for swollen glands.  Psychiatric/Behavioral:  Positive for depressed mood and sleep disturbance. The patient is nervous/anxious.     PMFS History:  Patient Active Problem List   Diagnosis Date Noted   DOE (dyspnea on exertion) 02/10/2024   Ear pain 04/04/2023   Elevated blood pressure reading 04/04/2023   Allergic rhinitis 06/18/2022   Mild intermittent asthma without complication 07/25/2020   Depression, recurrent (HCC) 07/25/2020   Morbid obesity (HCC) 03/08/2016   Meralgia paresthetica of left side  12/12/2015   Obesity 09/27/2012   Family history of coronary artery disease 09/27/2012   Controlled type 2 diabetes mellitus with complication, without long-term current use of insulin (HCC)     Past Medical History:  Diagnosis Date   Anxiety    anxiety attacks are infrequent   Asthma    Chest tightness     Diabetes (HCC)    Dry mouth    patient states her dentist diagnosed iwth dry mouth-uses Xylitol tabs qhs   Macular degeneration    per patient-treated by Dr Octavia   Meralgia paresthetica of left side 12/12/2015   Obesity 09/27/2012   Urinary frequency     Family History  Problem Relation Age of Onset   Arrhythmia Mother        V TACH   Ovarian cancer Mother    Breast cancer Mother 107   Atrial fibrillation Mother    Heart attack Father 21   Sudden Cardiac Death Brother    Heart attack Brother    Healthy Brother    Breast cancer Maternal Aunt    Breast cancer Maternal Aunt    Heart attack Paternal Uncle    Diabetes Paternal Uncle    Heart attack Maternal Grandfather    Cancer Maternal Grandfather    Heart disease Paternal Grandmother    Anemia Daughter    Obesity Daughter    Other Daughter        polyps in uterus   Epilepsy Son        as a child   Cancer Cousin    Heart attack Cousin    Heart attack Cousin    Heart attack Cousin    Heart attack Cousin    Heart attack Cousin    Past Surgical History:  Procedure Laterality Date   ABDOMINAL HYSTERECTOMY     precancer cells on pap smear; thinks left ovar remains   CESAREAN SECTION     FOOT SURGERY Right    cyst removal   HAMMERTOE RECONSTRUCTION WITH WEIL OSTEOTOMY Right 12/25/2015   Procedure: RIGHT THIRD HAMMERTOE CORRECTION, WEIL OSTEOTOMY;  Surgeon: Norleen Armor, MD;  Location: [facility];  Service: Orthopedics;  Laterality: Right;   HAND SURGERY Bilateral    HARDWARE REMOVAL Right 12/25/2015   Procedure: RIGHT FIFTH METATARSAL DEEP IMPLANT REMOVAL, EXOSTECTOMY;  Surgeon: Norleen Armor, MD;  Location: [facility];  Service: Orthopedics;  Laterality: Right;   MOUTH SURGERY     [facility]--removal of cyst per patient-awaiting path results   NECK SURGERY     cervical fusion   ROOT CANAL     TONSILLECTOMY     Social History   Social History Narrative   Patient drinks  about 1 cup of caffeine daily.   Patient is right handed.    Immunization History  Administered Date(s) Administered   Influenza,inj,Quad PF,6+ Mos 08/25/2018, 08/08/2020, 08/14/2021, 08/27/2022   Influenza-Unspecified 08/31/2020, 09/01/2022, 07/22/2023   Moderna Covid-19 Vaccine Bivalent Booster 22yrs & up 07/22/2023   PFIZER(Purple Top)SARS-COV-2 Vaccination 04/25/2020, 05/22/2020, 11/30/2020, 11/30/2020   Pneumococcal Conjugate,unspecified 12/29/2022   Pneumococcal Polysaccharide-23 07/20/2018   Td 07/20/2018   Zoster Recombinant(Shingrix ) 03/22/2022, 06/18/2022, 06/29/2022, 12/28/2022     Objective: Vital Signs: BP (!) 138/93 (BP Location: Right Arm, Patient Position: Sitting, Cuff Size: Normal)   Pulse 80   Resp 16   Ht 5' 5 (1.651 m)   Wt 243 lb (110.2 kg)   BMI 40.44 kg/m    Physical Exam Vitals and nursing note  reviewed.  Constitutional:      Appearance: She is well-developed.  HENT:     Head: Normocephalic and atraumatic.  Eyes:     Conjunctiva/sclera: Conjunctivae normal.  Cardiovascular:     Rate and Rhythm: Normal rate and regular rhythm.     Heart sounds: Normal heart sounds.  Pulmonary:     Effort: Pulmonary effort is normal.     Breath sounds: Normal breath sounds.  Abdominal:     General: Bowel sounds are normal.     Palpations: Abdomen is soft.  Musculoskeletal:     Cervical back: Normal range of motion.  Lymphadenopathy:     Cervical: No cervical adenopathy.  Skin:    General: Skin is warm and dry.     Capillary Refill: Capillary refill takes less than 2 seconds.  Neurological:     Mental Status: She is alert and oriented to person, place, and time.  Psychiatric:        Behavior: Behavior normal.      Musculoskeletal Exam: Generalized myalgias and muscle tenderness noted.  C-spine has limited range of motion lateral rotation.  Shoulder joints have good range of motion.  Elbow joints, wrist joints, MCPs, PIPs, DIPs have good range of motion  with no synovitis.  PIP and DIP thickening consistent with osteoarthritis of both hands.  Hip joints have good range of motion with no groin pain.  Discomfort with range of motion of both knee joints especially with full flexion of the right knee.  Mild warmth of both knees but no effusion noted.  Ankle joints have good range of motion with no tenderness or joint swelling.  CDAI Exam: CDAI Score: -- Patient Global: --; Provider Global: -- Swollen: --; Tender: -- Joint Exam 05/28/2024   No joint exam has been documented for this visit   There is currently no information documented on the homunculus. Go to the Rheumatology activity and complete the homunculus joint exam.  Investigation: No additional findings.  Imaging: No results found.  Recent Labs: Lab Results  Component Value Date   WBC 4.8 02/24/2024   HGB 12.3 02/24/2024   PLT 199 02/24/2024   NA 142 02/24/2024   K 4.8 02/24/2024   CL 106 02/24/2024   CO2 28 02/24/2024   GLUCOSE 105 (H) 02/24/2024   BUN 19 02/24/2024   CREATININE 0.80 02/24/2024   BILITOT 0.4 02/24/2024   ALKPHOS 68 05/23/2023   AST 26 02/24/2024   ALT 24 02/24/2024   PROT 7.0 02/24/2024   ALBUMIN 4.4 05/23/2023   CALCIUM  9.5 02/24/2024   GFRAA >60 12/19/2015    Speciality Comments: PLQ Eye Exam: 03/12/2024 Normal @ [facility] Follow up in 1 year  Procedures:  No procedures performed Allergies: Bee venom, Actos [pioglitazone], Jardiance [empagliflozin], Metformin and related, Other, and Adhesive [tape]     Assessment / Plan:     Visit Diagnoses: MCTD (mixed connective tissue disease) (HCC) - ANA negative, positive RNP, positive SSA, positive SSB, sicca symptoms, fatigue, hair loss, Raynaud's: Patient is currently taking Plaquenil  200 mg 1 tablet by mouth twice daily.  Plaquenil  was started in early January 2025 but she has not noticed any clinical benefit while taking it.  On examination no synovitis was noted.  She continues to  have myofascial pain, fatigue, and brain fog.  Her pain remains most severe involving both knees.  She has difficulty standing, sitting, or walking for long distances due to severity of pain and stiffness.  She is under the  care of orthopedics but has had an adequate response to cortisone injections in the past.  She is currently awaiting approval for viscosupplementation for both knees.  Plan to refer the patient for functional capacity testing-currently applying for disability.  Patient was evaluated by Dr. Darlean on 05/21/2024: No evidence of ILD.  Chest x-ray normal on 02/10/24.  PFTs obtained-04/10/24. Patient remains under the care of Dr. [place] and had an echocardiogram performed on 01/06/2024.  EKG: QTc normal on 12/19/2023-ok to continue plaquenil .  Patient plans on reducing Plaquenil  to once daily to see if she notices any difference in her symptoms.  Discussed that we could consider scheduling ultrasound of both hands to assess for synovitis in the future if needed. Reviewed lab work from 02/24/2024: Ro antibody negative, La negative, ANA negative, dsDNA negative, complements within normal limits, ESR within normal limits, and RNP remains borderline positive-1.2.  CBC and CMP updated today.  She will notify us  if she develops any new or worsening symptoms.  She will follow up in 3 months or sooner if needed.   High risk medication use - Plaquenil  200 mg 1 tablet by mouth twice daily-started early January 2025.  She has not noticed any clinical benefit while taking Plaquenil  and may try reducing the dose to once daily to see if she notices any difference in her symptoms.   Discussed my apprehension of adding any immunosuppressive agents since she has no active synovitis on exam. CBC and CMP updated on 02/24/2024. CBC and CMP released today.   PLQ Eye Exam: 03/12/2024 Normal @ [facility] Follow up in 1 year  - Plan: CBC with Differential/Platelet, Comprehensive metabolic panel with  GFR  Raynaud's syndrome without gangrene: She has intermittent symptoms of Raynaud's phenomenon.  Discussed the importance of keeping her core body temperature warm and making a dictations/avoiding triggers.  No signs of sclerodactyly or digital ulcerations noted.  Primary osteoarthritis of both hands: No synovitis on examination today.  Can consider scheduling ultrasound of both hands to assess for synovitis if she continues to have pain and stiffness bilaterally.  History of bilateral carpal tunnel release - Status post surgery several years ago.  Primary osteoarthritis of both knees: Chronic pain. Under care of orthopedics.  Warmth but no effusion.  Difficulty standing, sitting, or walking prolonged distances.  Inadequate response to a cortisone injection performed for the left knee.  Currently awaiting approval for viscosupplementation for both knees.  Plan to refer the patient for a functional capacity test.  Primary osteoarthritis of both feet -Surgery on her right first third and fifth toes by Dr. Roddie and Dr. Kit.  She is followed by Dr. Roddie now.  Plantar fasciitis of right foot - plantar fascia surgery by Dr. Kit.  Right foot x-ray January 30, 2021 showed mild degenerative changes and postsurgical changes.    DDD (degenerative disc disease), cervical - Status post fusion Dr. Mahalia.   Ongoing pain and stiffness in the C-spine.  She has been experiencing intermittent left-sided radiculopathy.  Patient has requested a referral to neurology.  She has been seeing a chiropractor on a regular basis.  Lumbar spondylosis: Chronic pain.  Patient has been seeing Dr. Sima on  regular basis. Plan to refer the patient for functional capacity testing.   Myalgia: Patient continues have generalized myalgias and muscle tenderness consistent with possible fibromyalgia.  She has generalized hyperalgesia and positive tender points on exam.  She continues to experience recurrent  flares involving increased myofascial pain, fatigue, and  brain fog.  She has been seeing a chiropractor Dr. Jalene on a regular basis. Plan to refer the patient to functional capacity testing.   Brain fog-Memory change--Family history of dementia.  Patient requested referral to neurology for further evaluation.  Other fatigue: Chronic, stable.  Other medical conditions are listed as follows:  Meralgia paresthetica of left side  Controlled type 2 diabetes mellitus with complication, without long-term current use of insulin (HCC)  Mild intermittent asthma without complication  Seasonal allergic rhinitis due to other allergic trigger  Anxiety and depression  Family history of coronary artery disease  Orders: Orders Placed This Encounter  Procedures   CBC with Differential/Platelet   Comprehensive metabolic panel with GFR   No orders of the defined types were placed in this encounter.    Follow-Up Instructions: Return in about 3 months (around 08/28/2024).   Waddell CHRISTELLA Craze, PA-C  Note - This record has been created using Dragon software.  Chart creation errors have been sought, but may not always  have been located. Such creation errors do not reflect on  the standard of medical care.

## 2024-05-18 ENCOUNTER — Other Ambulatory Visit (HOSPITAL_COMMUNITY)
Admission: RE | Admit: 2024-05-18 | Discharge: 2024-05-18 | Disposition: A | Discharge status: Home / Self Care | Payer: Self-pay | Source: Ambulatory Visit | Attending: Medical Genetics | Admitting: Medical Genetics

## 2024-05-18 DIAGNOSIS — Z006 Encounter for examination for normal comparison and control in clinical research program: Secondary | ICD-10-CM | POA: Insufficient documentation

## 2024-05-20 NOTE — Progress Notes (Unsigned)
 Lindsay Sanchez, female    DOB: 05-29-1971   MRN: [id number]   Brief patient profile:  53 yowf  with MCTDz never smoker but grew up with exposure lots sinus/ear infectionsbronchtis better since moved out at age 53  53 referred to pulmonary clinic 02/10/2024 by Dolphus  53 for doe x around Sept  2025   Pneumonia at age 4,  79, 27 hosp for the latter 2   Last child was 39  with baseline wt 250   with wt gain to 350 around 2022   Polyarthralgia spring 2024   and doe Sept 2024 and plaquenil  1st gen week Jan 2025 helping 50 % by 02/10/2024 but not breathing   Walking up driveway to pick up door 60 ft to lasnding x 6 steps stop at bottom due to knees    History of Present Illness  02/10/2024  Pulmonary/ 1st office eval/Lindsay Sanchez on singulair  /symbicort  prn but using cause it's not fall  Chief Complaint  Patient presents with   Pulmonary Consult    Referred by Dr. Dolphus. She c/o SOB over the past 6 months. She feels like she is unable to take a deep enough breath at times.   Dyspnea:  walking dogs 30 min per day with difficult hills that even at 330 could still do this with worse doe since 07/2024 to point hard to get up driveway which is usually easy for her. 53 f Cough: some tickle when lies down/ min production Sleep: bed is flat/ one pillow under head  SABA use: not using  02 [id number]:[id number]  Rec      05/21/2024  f/u ov/[place] office/Lindsay Sanchez re: *** maint on ***  No chief complaint on file.   Dyspnea:  *** Cough: *** Sleeping: ***   resp cc  SABA use: *** 02: ***  Lung cancer screening: ***   No obvious day to day or daytime variability or assoc excess/ purulent sputum or mucus plugs or hemoptysis or cp or chest tightness, subjective wheeze or overt sinus or hb symptoms.    Also denies any obvious fluctuation of symptoms with weather or environmental changes or other aggravating or alleviating factors except as outlined above   No unusual exposure hx or h/o childhood pna/ asthma or  knowledge of premature birth.  Current Allergies, Complete Past Medical History, Past Surgical History, Family History, and Social History were reviewed in [employer] record.  ROS  The following are not active complaints unless bolded Hoarseness, sore throat, dysphagia, dental problems, itching, sneezing,  nasal congestion or discharge of excess mucus or purulent secretions, ear ache,   fever, chills, sweats, unintended wt loss or wt gain, classically pleuritic or exertional cp,  orthopnea pnd or arm/hand swelling  or leg swelling, presyncope, palpitations, abdominal pain, anorexia, nausea, vomiting, diarrhea  or change in bowel habits or change in bladder habits, change in stools or change in urine, dysuria, hematuria,  rash, arthralgias, visual complaints, headache, numbness, weakness or ataxia or problems with walking or coordination,  change in mood or  memory.        No outpatient medications have been marked as taking for the 05/21/24 encounter (Appointment) with Lindsay Huckeby B, MD.            Past Medical History:  Diagnosis Date   Anxiety    anxiety attacks are infrequent   Asthma    Chest tightness    Diabetes (HCC)    Dry mouth    patient states her dentist  diagnosed iwth dry mouth-uses Xylitol tabs qhs   Macular degeneration    per patient-treated by Dr Octavia   Meralgia paresthetica of left side 12/12/2015   Obesity 09/27/2012   Urinary frequency       Objective:   Wts    05/21/2024       ***   02/24/24 251 lb 3.2 oz (113.9 kg)  02/10/24 255 lb (115.7 kg)  12/23/23 257 lb 6.4 oz (116.8 kg)      Vital signs reviewed  05/21/2024  - Note at rest 02 sats  ***% on ***   General appearance:    ***           Assessment

## 2024-05-21 ENCOUNTER — Encounter: Payer: Self-pay | Admitting: Internal Medicine

## 2024-05-21 ENCOUNTER — Ambulatory Visit: Admitting: Internal Medicine

## 2024-05-21 VITALS — BP 114/81 | HR 82 | Ht 67.0 in | Wt 244.8 lb

## 2024-05-21 DIAGNOSIS — R0609 Other forms of dyspnea: Secondary | ICD-10-CM | POA: Diagnosis not present

## 2024-05-21 NOTE — Patient Instructions (Signed)
Make sure you check your oxygen saturation at your highest level of activity(NOT after you stop)  to be sure it stays over 90% and keep track of it at least once a week, more often if breathing getting worse, and let me know if losing ground. (Collect the dots to connect the dots approach)     Please schedule a follow up visit in 12  months but call sooner if needed

## 2024-05-21 NOTE — Assessment & Plan Note (Addendum)
 Onset 07/2023 in pt with MCTD since spring of 2024  - 02/10/2024   @ 255 lb Walked on RA  x  3  lap(s) =  approx 750  ft  @ mod pace, stopped due to end of study  with lowest 02 sats 94% increased to 96% before stopping   - 05/21/2024  After extensive coaching inhaler device,  effectiveness =    90% s spacer > continue symbiocrt 160 up to 2bid with approp saba - PFT's  04/10/24   FEV1 2.60 (87 % ) ratio 0.78  p 6 % improvement from saba p 0 prior to study with DLCO  16.38 (72%)   and FV curve nl  and ERV 52 at wt 245    No evidnec of ILD assoc with MCTD > flu can be yearly, sooner if needed  Advised Make sure you check your oxygen saturation at your highest level of activity(NOT after you stop)  to be sure it stays over 90% and keep track of it at least once a week, more often if breathing getting worse, and let me know if losing ground. (Collect the dots to connect the dots approach)           Each maintenance medication was reviewed in detail including emphasizing most importantly the difference between maintenance and prns and under what circumstances the prns are to be triggered using an action plan format where appropriate.  Total time for H and P, chart review, counseling, reviewing h device(s) and generating customized AVS unique to this office visit / same day charting = 30 min summary f/u ov

## 2024-05-27 LAB — GENECONNECT MOLECULAR SCREEN: Genetic Analysis Overall Interpretation: NEGATIVE

## 2024-05-28 ENCOUNTER — Encounter: Payer: Self-pay | Admitting: Physician Assistant

## 2024-05-28 ENCOUNTER — Ambulatory Visit: Attending: Physician Assistant | Admitting: Physician Assistant

## 2024-05-28 VITALS — BP 138/93 | HR 80 | Resp 16 | Ht 65.0 in | Wt 243.0 lb

## 2024-05-28 DIAGNOSIS — J4520 Mild intermittent asthma, uncomplicated: Secondary | ICD-10-CM

## 2024-05-28 DIAGNOSIS — F419 Anxiety disorder, unspecified: Secondary | ICD-10-CM

## 2024-05-28 DIAGNOSIS — M5030 Other cervical disc degeneration, unspecified cervical region: Secondary | ICD-10-CM

## 2024-05-28 DIAGNOSIS — M19041 Primary osteoarthritis, right hand: Secondary | ICD-10-CM | POA: Diagnosis not present

## 2024-05-28 DIAGNOSIS — R4189 Other symptoms and signs involving cognitive functions and awareness: Secondary | ICD-10-CM

## 2024-05-28 DIAGNOSIS — Z8249 Family history of ischemic heart disease and other diseases of the circulatory system: Secondary | ICD-10-CM

## 2024-05-28 DIAGNOSIS — Z79899 Other long term (current) drug therapy: Secondary | ICD-10-CM | POA: Diagnosis not present

## 2024-05-28 DIAGNOSIS — M791 Myalgia, unspecified site: Secondary | ICD-10-CM

## 2024-05-28 DIAGNOSIS — M722 Plantar fascial fibromatosis: Secondary | ICD-10-CM

## 2024-05-28 DIAGNOSIS — M19071 Primary osteoarthritis, right ankle and foot: Secondary | ICD-10-CM

## 2024-05-28 DIAGNOSIS — E118 Type 2 diabetes mellitus with unspecified complications: Secondary | ICD-10-CM

## 2024-05-28 DIAGNOSIS — Z9889 Other specified postprocedural states: Secondary | ICD-10-CM

## 2024-05-28 DIAGNOSIS — I7300 Raynaud's syndrome without gangrene: Secondary | ICD-10-CM

## 2024-05-28 DIAGNOSIS — G5712 Meralgia paresthetica, left lower limb: Secondary | ICD-10-CM

## 2024-05-28 DIAGNOSIS — M351 Other overlap syndromes: Secondary | ICD-10-CM

## 2024-05-28 DIAGNOSIS — F32A Depression, unspecified: Secondary | ICD-10-CM

## 2024-05-28 DIAGNOSIS — R5383 Other fatigue: Secondary | ICD-10-CM

## 2024-05-28 DIAGNOSIS — J3089 Other allergic rhinitis: Secondary | ICD-10-CM

## 2024-05-28 DIAGNOSIS — M19072 Primary osteoarthritis, left ankle and foot: Secondary | ICD-10-CM

## 2024-05-28 DIAGNOSIS — M47816 Spondylosis without myelopathy or radiculopathy, lumbar region: Secondary | ICD-10-CM

## 2024-05-28 DIAGNOSIS — M19042 Primary osteoarthritis, left hand: Secondary | ICD-10-CM

## 2024-05-28 DIAGNOSIS — M170 Bilateral primary osteoarthritis of knee: Secondary | ICD-10-CM

## 2024-05-28 LAB — COMPREHENSIVE METABOLIC PANEL WITH GFR
AG Ratio: 1.8 (calc) (ref 1.0–2.5)
ALT: 17 U/L (ref 6–29)
AST: 20 U/L (ref 10–35)
Albumin: 4.5 g/dL (ref 3.6–5.1)
Alkaline phosphatase (APISO): 57 U/L (ref 37–153)
BUN: 17 mg/dL (ref 7–25)
CO2: 28 mmol/L (ref 20–32)
Calcium: 9.6 mg/dL (ref 8.6–10.4)
Chloride: 103 mmol/L (ref 98–110)
Creat: 0.9 mg/dL (ref 0.50–1.03)
Globulin: 2.5 g/dL (ref 1.9–3.7)
Glucose, Bld: 106 mg/dL (ref 65–139)
Potassium: 4.4 mmol/L (ref 3.5–5.3)
Sodium: 139 mmol/L (ref 135–146)
Total Bilirubin: 0.4 mg/dL (ref 0.2–1.2)
Total Protein: 7 g/dL (ref 6.1–8.1)
eGFR: 75 mL/min/1.73m2 (ref >=60)

## 2024-05-28 LAB — CBC WITH DIFFERENTIAL/PLATELET
Absolute Lymphocytes: 1562 {cells}/uL (ref 850–3900)
Absolute Monocytes: 531 {cells}/uL (ref 200–950)
Basophils Absolute: 38 {cells}/uL (ref 0–200)
Basophils Relative: 0.6 %
Eosinophils Absolute: 58 {cells}/uL (ref 15–500)
Eosinophils Relative: 0.9 %
HCT: 38.8 % (ref 35.0–45.0)
Hemoglobin: 12.8 g/dL (ref 11.7–15.5)
MCH: 30.1 pg (ref 27.0–33.0)
MCHC: 33 g/dL (ref 32.0–36.0)
MCV: 91.3 fL (ref 80.0–100.0)
MPV: 10.4 fL (ref 7.5–12.5)
Monocytes Relative: 8.3 %
Neutro Abs: 4211 {cells}/uL (ref 1500–7800)
Neutrophils Relative %: 65.8 %
Platelets: 213 Thousand/uL (ref 140–400)
RBC: 4.25 Million/uL (ref 3.80–5.10)
RDW: 12.2 % (ref 11.0–15.0)
Total Lymphocyte: 24.4 %
WBC: 6.4 Thousand/uL (ref 3.8–10.8)

## 2024-05-29 ENCOUNTER — Ambulatory Visit: Payer: Self-pay | Admitting: Physician Assistant

## 2024-05-29 NOTE — Progress Notes (Signed)
 CBC and CMP WNL

## 2024-06-22 ENCOUNTER — Ambulatory Visit: Payer: BC Managed Care – PPO | Admitting: Family Medicine

## 2024-06-29 ENCOUNTER — Encounter: Payer: Self-pay | Admitting: Family Medicine

## 2024-06-29 ENCOUNTER — Encounter (INDEPENDENT_AMBULATORY_CARE_PROVIDER_SITE_OTHER): Payer: Self-pay

## 2024-06-29 ENCOUNTER — Ambulatory Visit: Admitting: Family Medicine

## 2024-06-29 VITALS — BP 118/60 | HR 85 | Temp 97.9°F | Ht 65.0 in | Wt 236.9 lb

## 2024-06-29 DIAGNOSIS — Z13220 Encounter for screening for lipoid disorders: Secondary | ICD-10-CM

## 2024-06-29 DIAGNOSIS — M351 Other overlap syndromes: Secondary | ICD-10-CM | POA: Diagnosis not present

## 2024-06-29 DIAGNOSIS — Z0000 Encounter for general adult medical examination without abnormal findings: Secondary | ICD-10-CM | POA: Diagnosis not present

## 2024-06-29 DIAGNOSIS — E118 Type 2 diabetes mellitus with unspecified complications: Secondary | ICD-10-CM | POA: Diagnosis not present

## 2024-06-29 DIAGNOSIS — Z7985 Long-term (current) use of injectable non-insulin antidiabetic drugs: Secondary | ICD-10-CM

## 2024-06-29 LAB — LIPID PANEL
Cholesterol: 168 mg/dL (ref 0–200)
HDL: 74.2 mg/dL
LDL Cholesterol: 78 mg/dL (ref 0–99)
NonHDL: 93.91
Total CHOL/HDL Ratio: 2
Triglycerides: 80 mg/dL (ref 0.0–149.0)
VLDL: 16 mg/dL (ref 0.0–40.0)

## 2024-06-29 LAB — POCT GLYCOSYLATED HEMOGLOBIN (HGB A1C): Hemoglobin A1C: 5.6 % (ref 4.0–5.6)

## 2024-06-29 LAB — MICROALBUMIN / CREATININE URINE RATIO
Creatinine,U: 157.5 mg/dL
Microalb Creat Ratio: UNDETERMINED mg/g (ref 0.0–30.0)
Microalb, Ur: <0.7 mg/dL

## 2024-06-29 MED ORDER — PREGABALIN 50 MG PO CAPS: 50.0000 mg | ORAL_CAPSULE | Freq: Three times a day (TID) | ORAL | 0 refills | Status: DC

## 2024-06-29 NOTE — Progress Notes (Signed)
 Complete physical exam  Patient: Lindsay Sanchez   DOB: 1971-07-21   53 y.o. Female  MRN: [id number]  Subjective:    Chief Complaint  Patient presents with   Medical Management of Chronic Issues    Lindsay Sanchez is a 53 y.o. female who presents today for a complete physical exam. She reports consuming a low carb diet. The patient does not participate in regular exercise at present. Has trouble with steps, stays busy but not really exercising. She generally feels well. She reports sleeping fairly well, has chronic pain from her autoimmune disease, often wakes due to joint or neck pain. She does not have additional problems to discuss today.   Takes vitamin D and K supplements, takes preservision for her eyes.  Takes Omega 3 fatty acids also.   Most recent fall risk assessment:    02/10/2024   10:40 AM  Fall Risk   Falls in the past year? 0  Number falls in past yr: 0  Injury with Fall? 0     Most recent depression screenings:    06/29/2024   10:01 AM 12/23/2023    9:07 AM  PHQ 2/9 Scores  PHQ - 2 Score 0 1  PHQ- 9 Score 6 4    Vision:Within last year and Dental: No current dental problems and Receives regular dental care  Patient Active Problem List   Diagnosis Date Noted   DOE (dyspnea on exertion) 02/10/2024   Ear pain 04/04/2023   Elevated blood pressure reading 04/04/2023   Allergic rhinitis 06/18/2022   Mild intermittent asthma without complication 07/25/2020   Depression, recurrent (HCC) 07/25/2020   Morbid obesity (HCC) 03/08/2016   Meralgia paresthetica of left side 12/12/2015   Obesity 09/27/2012   Family history of coronary artery disease 09/27/2012   Controlled type 2 diabetes mellitus with complication, without long-term current use of insulin [facility])       Patient Care Team: Ozell Heron HERO, MD as PCP - General (Family Medicine) [place], Peter M, MD as PCP - Cardiology (Cardiology) Marget Lenis, MD as Consulting Physician (Obstetrics and  Gynecology) Darlean Ozell NOVAK, MD as Consulting Physician (Pulmonary Disease)   Outpatient Medications Prior to Visit  Medication Sig   Accu-Chek FastClix Lancets MISC Use as directed   albuterol  (PROVENTIL  HFA;VENTOLIN  HFA) 108 (90 Base) MCG/ACT inhaler Inhale 2 puffs into the lungs every 6 (six) hours as needed.   Blood Glucose Monitoring Suppl (CONTOUR BLOOD GLUCOSE SYSTEM) w/Device KIT 1 strip by Does not apply route daily.   budesonide -formoterol  (SYMBICORT ) 160-4.5 MCG/ACT inhaler Inhale 2 puffs into the lungs daily.   cetirizine (ZYRTEC) 10 MG tablet Take 10 mg by mouth as needed.   diclofenac  (VOLTAREN ) 75 MG EC tablet Take 1 tablet by mouth twice daily   estradiol  (ESTRACE ) 2 MG tablet Take 1 tablet (2 mg total) by mouth daily.   glucose blood (ACCU-CHEK GUIDE) test strip USE TO CHECK BLOOD SUGAR ONCE DAILY IN THE MORNING   hydroxychloroquine  (PLAQUENIL ) 200 MG tablet Take 1 tablet by mouth twice daily   losartan  (COZAAR ) 25 MG tablet Take 1 tablet by mouth once daily   MAGNESIUM PO Take by mouth daily.   montelukast  (SINGULAIR ) 10 MG tablet TAKE 1 TABLET BY MOUTH AT BEDTIME   MOUNJARO  15 MG/0.5ML Pen INJECT 15 MG INTO THE SKIN ONCE A WEEK   Multiple Vitamins-Minerals (PRESERVISION AREDS PO) Take by mouth daily.   OVER THE COUNTER MEDICATION Xylitol melts-at bedtime for dry mouth   rosuvastatin  (  CRESTOR ) 5 MG tablet Take 1 tablet by mouth once daily   Spacer/Aero-Holding Chambers (AEROCHAMBER PLUS) inhaler Use as instructed   tirzepatide  (MOUNJARO ) 15 MG/0.5ML Pen Inject 15 mg into the skin once a week.   VITAMIN D PO Take 5,000 Units by mouth daily.   [DISCONTINUED] amoxicillin -clavulanate (AUGMENTIN ) 875-125 MG tablet Take 1 tablet by mouth 2 (two) times daily.   No facility-administered medications prior to visit.    Review of Systems  HENT:  Negative for hearing loss.   Eyes:  Negative for blurred vision.  Respiratory:  Negative for shortness of breath.   Cardiovascular:   Negative for chest pain.  Gastrointestinal: Negative.   Genitourinary: Negative.   Musculoskeletal:  Negative for back pain.  Neurological:  Negative for headaches.  Psychiatric/Behavioral:  Negative for depression.        Objective:     BP 118/60   Pulse 85   Temp 97.9 F (36.6 C) (Oral)   Ht 5' 5 (1.651 m)   Wt 236 lb 14.4 oz (107.5 kg)   SpO2 98%   BMI 39.42 kg/m    Physical Exam Vitals reviewed.  Constitutional:      Appearance: Normal appearance. She is well-groomed. She is obese.  HENT:     Right Ear: Tympanic membrane and ear canal normal.     Left Ear: Tympanic membrane and ear canal normal.     Mouth/Throat:     Mouth: Mucous membranes are moist.     Pharynx: No posterior oropharyngeal erythema.  Eyes:     Conjunctiva/sclera: Conjunctivae normal.  Neck:     Thyroid: No thyromegaly.  Cardiovascular:     Rate and Rhythm: Normal rate and regular rhythm.     Pulses: Normal pulses.     Heart sounds: S1 normal and S2 normal.  Pulmonary:     Effort: Pulmonary effort is normal.     Breath sounds: Normal breath sounds and air entry.  Abdominal:     General: Abdomen is flat. Bowel sounds are normal.     Palpations: Abdomen is soft.  Musculoskeletal:     Right lower leg: No edema.     Left lower leg: No edema.  Lymphadenopathy:     Cervical: No cervical adenopathy.  Neurological:     Mental Status: She is alert and oriented to person, place, and time. Mental status is at baseline.     Gait: Gait is intact.  Psychiatric:        Mood and Affect: Mood and affect normal.        Speech: Speech normal.        Behavior: Behavior normal.        Judgment: Judgment normal.      Results for orders placed or performed in visit on 06/29/24  POC HgB A1c  Result Value Ref Range   Hemoglobin A1C 5.6 4.0 - 5.6 %   HbA1c POC (<> result, manual entry)     HbA1c, POC (prediabetic range)     HbA1c, POC (controlled diabetic range)         Assessment & Plan:     Routine Health Maintenance and Physical Exam  Immunization History  Administered Date(s) Administered   Influenza,inj,Quad PF,6+ Mos 08/25/2018, 08/08/2020, 08/14/2021, 08/27/2022   Influenza-Unspecified 08/31/2020, 09/01/2022, 07/22/2023   Moderna Covid-19 Vaccine Bivalent Booster 19yrs & up 07/22/2023   PFIZER(Purple Top)SARS-COV-2 Vaccination 04/25/2020, 05/22/2020, 11/30/2020, 11/30/2020   Pneumococcal Conjugate,unspecified 12/29/2022   Pneumococcal Polysaccharide-23 07/20/2018   Td 07/20/2018  Zoster Recombinant(Shingrix ) 03/22/2022, 06/18/2022, 06/29/2022, 12/28/2022    Health Maintenance  Topic Date Due   HIV Screening  Never done   Diabetic kidney evaluation - Urine ACR  Never done   Hepatitis C Screening  Never done   Hepatitis B Vaccines (1 of 3 - 19+ 3-dose series) Never done   COVID-19 Vaccine (6 - 2024-25 season) 09/16/2023   Pneumococcal Vaccine: 19-49 Years (2 of 2 - PCV) 12/30/2023   Pneumococcal Vaccine: 50+ Years (2 of 2 - PCV) 12/30/2023   INFLUENZA VACCINE  06/22/2024   FOOT EXAM  12/22/2024   HEMOGLOBIN A1C  12/30/2024   OPHTHALMOLOGY EXAM  03/12/2025   Diabetic kidney evaluation - eGFR measurement  05/28/2025   MAMMOGRAM  03/19/2026   DTaP/Tdap/Td (2 - Tdap) 07/20/2028   Colonoscopy  10/24/2029   Zoster Vaccines- Shingrix   Completed   HPV VACCINES  Aged Out   Meningococcal B Vaccine  Aged Out    Discussed health benefits of physical activity, and encouraged her to engage in regular exercise appropriate for her age and condition.  Routine adult health maintenance  Controlled type 2 diabetes mellitus with complication, without long-term current use of insulin (HCC) -     POCT glycosylated hemoglobin (Hb A1C) -     Collection capillary blood specimen -     Microalbumin / creatinine urine ratio; Future  Lipid screening -     Lipid panel; Future  Mixed connective tissue disease (HCC) -     Pregabalin ; Take 1 capsule (50 mg total) by mouth 3 (three)  times daily.  Dispense: 90 capsule; Refill: 0   Normal physical exam findings. I counseled the patient on the recommended amount of exercise per CDC recommendation. I reviewed preventative screening, immunizations, and medical history and updated in the chart, and appropriate labs and vaccinations were ordered. Handouts given on healthy eating and exercise.   Return in about 6 months (around 12/30/2024) for DM.     Heron CHRISTELLA Sharper, MD

## 2024-07-01 ENCOUNTER — Ambulatory Visit: Payer: Self-pay | Admitting: Family Medicine

## 2024-07-27 ENCOUNTER — Other Ambulatory Visit: Payer: Self-pay | Admitting: Family Medicine

## 2024-07-27 DIAGNOSIS — J3081 Allergic rhinitis due to animal (cat) (dog) hair and dander: Secondary | ICD-10-CM

## 2024-07-28 ENCOUNTER — Other Ambulatory Visit: Payer: Self-pay | Admitting: Rheumatology

## 2024-07-28 DIAGNOSIS — M351 Other overlap syndromes: Secondary | ICD-10-CM

## 2024-07-30 NOTE — Telephone Encounter (Signed)
 Last Fill: 04/20/2024  Eye exam: 03/12/2024   Labs: 05/28/2024 CBC and CMP WNL   Next Visit: 08/27/2024  Last Visit: 05/28/2024  IK:FRUI (mixed connective tissue disease)   Current Dose per office note on 05/28/2024: Plaquenil  200 mg 1 tablet by mouth twice daily   Okay to refill Plaquenil ?

## 2024-08-09 ENCOUNTER — Other Ambulatory Visit: Payer: Self-pay | Admitting: Family Medicine

## 2024-08-09 ENCOUNTER — Other Ambulatory Visit: Payer: Self-pay | Admitting: Physician Assistant

## 2024-08-09 DIAGNOSIS — M351 Other overlap syndromes: Secondary | ICD-10-CM

## 2024-08-09 DIAGNOSIS — E118 Type 2 diabetes mellitus with unspecified complications: Secondary | ICD-10-CM

## 2024-08-09 DIAGNOSIS — J3081 Allergic rhinitis due to animal (cat) (dog) hair and dander: Secondary | ICD-10-CM

## 2024-08-09 DIAGNOSIS — R030 Elevated blood-pressure reading, without diagnosis of hypertension: Secondary | ICD-10-CM

## 2024-08-13 NOTE — Progress Notes (Signed)
 Office Visit Note  Patient: Lindsay Sanchez             Date of Birth: 09-12-1971           MRN: [id number]             PCP: Ozell Heron HERO, MD Referring: Ozell Heron HERO, MD Visit Date: 08/27/2024 Occupation: Data Unavailable  Subjective:  Discuss medications  History of Present Illness: VANCE BELCOURT is a 53 y.o. female with history of mixed connective tissue disease.  Patient discontinued Plaquenil  about 6 weeks ago due to not notice any clinical benefit.  She has not noticed any new or worsening symptoms since discontinuing Plaquenil .  Patient continues to experience brain fog and fatigue.  Patient states that she is scheduled to see Dr. Margaret on 10/01/2024 for further evaluation.  She has started to take a vitamin B12 supplement daily and is taking a vitamin D supplement.  Patient states that she looked up the possible side effects of losartan  and increased joint pain was listed.  Patient will be discontinuing losartan  and will be following her blood pressure closely under the care of her PCP. Patient states that she has been started on Lyrica  and has been taking 50 mg at bedtime.  Patient states that on days that she has increased pain she will take Lyrica  up to 3 times daily as prescribed.  She has noticed some benefit while taking Lyrica  but states that the daytime dose can cause increased drowsiness and brain fog.  Activities of Daily Living:  Patient reports morning stiffness for 20-30 minutes.   Patient Reports nocturnal pain.  Difficulty dressing/grooming: Denies Difficulty climbing stairs: Reports Difficulty getting out of chair: Reports Difficulty using hands for taps, buttons, cutlery, and/or writing: Reports  Review of Systems  Constitutional:  Positive for fatigue.  HENT:  Positive for mouth dryness. Negative for mouth sores.   Eyes:  Positive for dryness.  Respiratory:  Negative for shortness of breath.   Cardiovascular:  Negative for chest pain and  palpitations.  Gastrointestinal:  Positive for constipation. Negative for blood in stool and diarrhea.  Endocrine: Negative for increased urination.  Genitourinary:  Negative for involuntary urination.  Musculoskeletal:  Positive for joint pain, gait problem, joint pain, myalgias, muscle weakness, morning stiffness, muscle tenderness and myalgias. Negative for joint swelling.  Skin:  Positive for sensitivity to sunlight. Negative for color change, rash and hair loss.  Allergic/Immunologic: Negative for susceptible to infections.  Neurological:  Positive for dizziness and headaches.  Hematological:  Negative for swollen glands.  Psychiatric/Behavioral:  Positive for sleep disturbance. Negative for depressed mood. The patient is nervous/anxious.     PMFS History:  Patient Active Problem List   Diagnosis Date Noted   DOE (dyspnea on exertion) 02/10/2024   Ear pain 04/04/2023   Elevated blood pressure reading 04/04/2023   Allergic rhinitis 06/18/2022   Mild intermittent asthma without complication 07/25/2020   Depression, recurrent 07/25/2020   Morbid obesity (HCC) 03/08/2016   Meralgia paresthetica of left side 12/12/2015   Obesity 09/27/2012   Family history of coronary artery disease 09/27/2012   Controlled type 2 diabetes mellitus with complication, without long-term current use of insulin (HCC)     Past Medical History:  Diagnosis Date   Anxiety    anxiety attacks are infrequent   Asthma    Chest tightness    Diabetes (HCC)    Dry mouth    patient states her dentist diagnosed iwth dry mouth-uses Xylitol  tabs qhs   Macular degeneration    per patient-treated by Dr Octavia   Meralgia paresthetica of left side 12/12/2015   Obesity 09/27/2012   Urinary frequency     Family History  Problem Relation Age of Onset   Arrhythmia Mother        V TACH   Ovarian cancer Mother    Breast cancer Mother 3   Atrial fibrillation Mother    Heart attack Father 56   Sudden Cardiac Death  Brother    Heart attack Brother    Healthy Brother    Breast cancer Maternal Aunt    Breast cancer Maternal Aunt    Heart attack Paternal Uncle    Diabetes Paternal Uncle    Heart attack Maternal Grandfather    Cancer Maternal Grandfather    Heart disease Paternal Grandmother    Anemia Daughter    Obesity Daughter    Other Daughter        polyps in uterus   Epilepsy Son        as a child   Cancer Cousin    Heart attack Cousin    Heart attack Cousin    Heart attack Cousin    Heart attack Cousin    Heart attack Cousin    Past Surgical History:  Procedure Laterality Date   CESAREAN SECTION     FOOT SURGERY Right    cyst removal   HAMMERTOE RECONSTRUCTION WITH WEIL OSTEOTOMY Right 12/25/2015   Procedure: RIGHT THIRD HAMMERTOE CORRECTION, WEIL OSTEOTOMY;  Surgeon: Norleen Armor, MD;  Location: [facility];  Service: Orthopedics;  Laterality: Right;   HAND SURGERY Bilateral    HARDWARE REMOVAL Right 12/25/2015   Procedure: RIGHT FIFTH METATARSAL DEEP IMPLANT REMOVAL, EXOSTECTOMY;  Surgeon: Norleen Armor, MD;  Location: [facility];  Service: Orthopedics;  Laterality: Right;   MOUTH SURGERY     [facility]--removal of cyst per patient-awaiting path results   NECK SURGERY     cervical fusion   ROOT CANAL     TONSILLECTOMY     TOTAL ABDOMINAL HYSTERECTOMY     precancer cells on pap smear; thinks left ovar remains   Social History   Tobacco Use   Smoking status: Never    Passive exposure: Past   Smokeless tobacco: Never  Vaping Use   Vaping status: Never Used  Substance Use Topics   Alcohol use: Yes    Comment: occasional   Drug use: No   Social History   Social History Narrative   Patient drinks about 1 cup of caffeine daily.   Patient is right handed.      Immunization History  Administered Date(s) Administered   Influenza,inj,Quad PF,6+ Mos 08/25/2018, 08/08/2020, 08/14/2021, 08/27/2022   Influenza-Unspecified 08/31/2020,  09/01/2022, 07/22/2023   Moderna Covid-19 Vaccine Bivalent Booster 86yrs & up 07/22/2023   PFIZER(Purple Top)SARS-COV-2 Vaccination 04/25/2020, 05/22/2020, 11/30/2020, 11/30/2020   Pneumococcal Conjugate,unspecified 12/29/2022   Pneumococcal Polysaccharide-23 07/20/2018   Td 07/20/2018   Zoster Recombinant(Shingrix ) 03/22/2022, 06/18/2022, 06/29/2022, 12/28/2022     Objective: Vital Signs: BP 124/88   Pulse 75   Temp 97.6 F (36.4 C)   Resp 14   Ht 5' 6 (1.676 m)   Wt 237 lb 9.6 oz (107.8 kg)   BMI 38.35 kg/m    Physical Exam Vitals and nursing note reviewed.  Constitutional:      Appearance: She is well-developed.  HENT:     Head: Normocephalic and atraumatic.  Eyes:  Conjunctiva/sclera: Conjunctivae normal.  Cardiovascular:     Rate and Rhythm: Normal rate and regular rhythm.     Heart sounds: Normal heart sounds.  Pulmonary:     Effort: Pulmonary effort is normal.     Breath sounds: Normal breath sounds.  Abdominal:     General: Bowel sounds are normal.     Palpations: Abdomen is soft.  Musculoskeletal:     Cervical back: Normal range of motion.  Lymphadenopathy:     Cervical: No cervical adenopathy.  Skin:    General: Skin is warm and dry.     Capillary Refill: Capillary refill takes less than 2 seconds.  Neurological:     Mental Status: She is alert and oriented to person, place, and time.  Psychiatric:        Behavior: Behavior normal.      Musculoskeletal Exam: C-spine has limited range of motion.  Limited mobility of the lumbar spine.  Shoulder joints have good range of motion.  Elbow joints, wrist joints, MCPs, PIPs, DIPs have good range of motion with no synovitis.  PIP and DIP thickening consistent with osteoarthritis of both hands.  Hip joints have good range of motion with no groin pain.  Discomfort with range of motion of both knees.  Limited flexion of the right knee with mild warmth.  No effusion noted.  Ankle joints have good range of  motion.   CDAI Exam: CDAI Score: -- Patient Global: --; Provider Global: -- Swollen: --; Tender: -- Joint Exam 08/27/2024   No joint exam has been documented for this visit   There is currently no information documented on the homunculus. Go to the Rheumatology activity and complete the homunculus joint exam.  Investigation: No additional findings.  Imaging: No results found.  Recent Labs: Lab Results  Component Value Date   WBC 6.4 05/28/2024   HGB 12.8 05/28/2024   PLT 213 05/28/2024   NA 139 05/28/2024   K 4.4 05/28/2024   CL 103 05/28/2024   CO2 28 05/28/2024   GLUCOSE 106 05/28/2024   BUN 17 05/28/2024   CREATININE 0.90 05/28/2024   BILITOT 0.4 05/28/2024   ALKPHOS 68 05/23/2023   AST 20 05/28/2024   ALT 17 05/28/2024   PROT 7.0 05/28/2024   ALBUMIN 4.4 05/23/2023   CALCIUM  9.6 05/28/2024   GFRAA >60 12/19/2015    Speciality Comments: PLQ Eye Exam: 03/12/2024 Normal @ [facility] Follow up in 1 year  Procedures:  No procedures performed Allergies: Bee pollen, Bee venom, Empagliflozin, Metformin, Metformin and related, Other, Pioglitazone, and Adhesive [tape]    Assessment / Plan:     Visit Diagnoses: MCTD (mixed connective tissue disease) - ANA negative, positive RNP, positive SSA, positive SSB, sicca symptoms, fatigue, hair loss, Raynaud's: The patient's primary concerns remain brain fog, fatigue, and intermittent arthralgias.  She was prescribed Plaquenil  starting in early January 2025 and was taking it twice daily.  She did not notice any clinical benefit while taking Plaquenil  discontinued 6 weeks ago.  She has not noticed any new or worsening symptoms while off of Plaquenil .  Different treatment options were discussed today including the use of methotrexate or Imuran but she would like to hold off on adding any immunosuppressive agents at this time.  She has been started on Lyrica  and has been taking 50 mg at bedtime.  She is also been taking  vitamin B12 and vitamin D on a daily basis to try to help with her level of fatigue  and brain fog.  She is scheduled to see Dr. Margaret on 10/01/2024 for further evaluation.  She would like to have her lab work rechecked today.  Discussed symptoms to monitor for closely.  She will notify us  if she develops any new or worsening symptoms.  She will follow-up in the office in 3 months or sooner if needed.  - Plan: Protein / creatinine ratio, urine, C3 and C4, Sedimentation rate, Comprehensive metabolic panel with GFR, Anti-DNA antibody, double-stranded, ANA, CBC with Differential/Platelet, RNP Antibody  High risk medication use -Patient discontinued Plaquenil  about 6 weeks ago due to no clinical benefit.  Previously taking Plaquenil  200 mg 1 tablet by mouth twice daily-started early January 2025.  Patient is apprehensive to add on an immunosuppressive agent at this time. CBC and CMP updated on 05/28/24.  Orders for CBC and CMP released today.  PLQ Eye Exam: 03/12/2024 Normal @ [facility] Follow up in 1 year  - Plan: Comprehensive metabolic panel with GFR, CBC with Differential/Platelet  Raynaud's syndrome without gangrene: Not currently symptomatic.  She continues to have intermittent symptoms of Raynaud's phenomenon.  No signs of sclerodactyly were noted.  Primary osteoarthritis of both hands: No synovitis noted on examination today.  Plan to recheck sed rate today.  History of bilateral carpal tunnel release - Status post surgery several years ago.  Primary osteoarthritis of both knees: Patient continues to experience intermittent discomfort involving both knees.  Warmth of the right knee was noted.  She did not notice any benefit while taking Plaquenil  and discontinued 6 weeks ago.  Primary osteoarthritis of both feet - -Surgery on her right first third and fifth toes by Dr. Roddie and Dr. Kit.  She is followed by Dr. Roddie  Plantar fasciitis of right foot - plantar fascia surgery  by Dr. Kit.  Right foot x-ray January 30, 2021 showed mild degenerative changes and postsurgical changes.  DDD (degenerative disc disease), cervical - Status post fusion Dr. Mahalia.  Lumbar spondylosis - Patient has been seeing Dr. Sima on regular basis.  She has filed for disability.   Other fatigue: Chronic.  She is taking vitamin B12 and vitamin D daily.    Other medical conditions are listed as follows:  Meralgia paresthetica of left side  Controlled type 2 diabetes mellitus with complication, without long-term current use of insulin (HCC)  Mild intermittent asthma without complication  Seasonal allergic rhinitis due to other allergic trigger  Anxiety and depression  Family history of coronary artery disease  Brain fog: She has been taking vitamin B12 daily and vitamin D supplement daily.  She will be evaluated by Dr. Margaret on 10/01/2024.  Orders: Orders Placed This Encounter  Procedures   Protein / creatinine ratio, urine   C3 and C4   Sedimentation rate   Comprehensive metabolic panel with GFR   Anti-DNA antibody, double-stranded   ANA   CBC with Differential/Platelet   RNP Antibody   No orders of the defined types were placed in this encounter.   Follow-Up Instructions: Return in about 3 months (around 11/27/2024) for MCTD.   Waddell CHRISTELLA Craze, PA-C  Note - This record has been created using Dragon software.  Chart creation errors have been sought, but may not always  have been located. Such creation errors do not reflect on  the standard of medical care.

## 2024-08-27 ENCOUNTER — Encounter: Payer: Self-pay | Admitting: Physician Assistant

## 2024-08-27 ENCOUNTER — Ambulatory Visit: Attending: Physician Assistant | Admitting: Physician Assistant

## 2024-08-27 VITALS — BP 124/88 | HR 75 | Temp 97.6°F | Resp 14 | Ht 66.0 in | Wt 237.6 lb

## 2024-08-27 DIAGNOSIS — E118 Type 2 diabetes mellitus with unspecified complications: Secondary | ICD-10-CM

## 2024-08-27 DIAGNOSIS — M19041 Primary osteoarthritis, right hand: Secondary | ICD-10-CM | POA: Diagnosis not present

## 2024-08-27 DIAGNOSIS — F32A Depression, unspecified: Secondary | ICD-10-CM

## 2024-08-27 DIAGNOSIS — M170 Bilateral primary osteoarthritis of knee: Secondary | ICD-10-CM

## 2024-08-27 DIAGNOSIS — Z9889 Other specified postprocedural states: Secondary | ICD-10-CM

## 2024-08-27 DIAGNOSIS — J3089 Other allergic rhinitis: Secondary | ICD-10-CM

## 2024-08-27 DIAGNOSIS — R5383 Other fatigue: Secondary | ICD-10-CM

## 2024-08-27 DIAGNOSIS — Z79899 Other long term (current) drug therapy: Secondary | ICD-10-CM

## 2024-08-27 DIAGNOSIS — I7300 Raynaud's syndrome without gangrene: Secondary | ICD-10-CM | POA: Diagnosis not present

## 2024-08-27 DIAGNOSIS — R4189 Other symptoms and signs involving cognitive functions and awareness: Secondary | ICD-10-CM

## 2024-08-27 DIAGNOSIS — F419 Anxiety disorder, unspecified: Secondary | ICD-10-CM

## 2024-08-27 DIAGNOSIS — G5712 Meralgia paresthetica, left lower limb: Secondary | ICD-10-CM

## 2024-08-27 DIAGNOSIS — M19071 Primary osteoarthritis, right ankle and foot: Secondary | ICD-10-CM

## 2024-08-27 DIAGNOSIS — M47816 Spondylosis without myelopathy or radiculopathy, lumbar region: Secondary | ICD-10-CM

## 2024-08-27 DIAGNOSIS — M19042 Primary osteoarthritis, left hand: Secondary | ICD-10-CM

## 2024-08-27 DIAGNOSIS — M5030 Other cervical disc degeneration, unspecified cervical region: Secondary | ICD-10-CM

## 2024-08-27 DIAGNOSIS — J4520 Mild intermittent asthma, uncomplicated: Secondary | ICD-10-CM

## 2024-08-27 DIAGNOSIS — Z8249 Family history of ischemic heart disease and other diseases of the circulatory system: Secondary | ICD-10-CM

## 2024-08-27 DIAGNOSIS — M19072 Primary osteoarthritis, left ankle and foot: Secondary | ICD-10-CM

## 2024-08-27 DIAGNOSIS — M351 Other overlap syndromes: Secondary | ICD-10-CM | POA: Diagnosis not present

## 2024-08-27 DIAGNOSIS — M722 Plantar fascial fibromatosis: Secondary | ICD-10-CM

## 2024-08-27 DIAGNOSIS — M791 Myalgia, unspecified site: Secondary | ICD-10-CM

## 2024-08-28 ENCOUNTER — Ambulatory Visit: Payer: Self-pay | Admitting: Physician Assistant

## 2024-08-28 NOTE — Progress Notes (Signed)
Complements WNL

## 2024-08-28 NOTE — Progress Notes (Signed)
 CBC and CMP WNL ESR WNL Urine protein creatinine ratio WNL

## 2024-08-29 LAB — CBC WITH DIFFERENTIAL/PLATELET
Absolute Lymphocytes: 1517 {cells}/uL (ref 850–3900)
Absolute Monocytes: 389 {cells}/uL (ref 200–950)
Basophils Absolute: 49 {cells}/uL (ref 0–200)
Basophils Relative: 0.9 %
Eosinophils Absolute: 49 {cells}/uL (ref 15–500)
Eosinophils Relative: 0.9 %
HCT: 38.7 % (ref 35.0–45.0)
Hemoglobin: 12.9 g/dL (ref 11.7–15.5)
MCH: 30.6 pg (ref 27.0–33.0)
MCHC: 33.3 g/dL (ref 32.0–36.0)
MCV: 91.7 fL (ref 80.0–100.0)
MPV: 10.7 fL (ref 7.5–12.5)
Monocytes Relative: 7.2 %
Neutro Abs: 3397 {cells}/uL (ref 1500–7800)
Neutrophils Relative %: 62.9 %
Platelets: 205 Thousand/uL (ref 140–400)
RBC: 4.22 Million/uL (ref 3.80–5.10)
RDW: 12.5 % (ref 11.0–15.0)
Total Lymphocyte: 28.1 %
WBC: 5.4 Thousand/uL (ref 3.8–10.8)

## 2024-08-29 LAB — PROTEIN / CREATININE RATIO, URINE
Creatinine, Urine: 163 mg/dL (ref 20–275)
Protein/Creat Ratio: 55 mg/g{creat} (ref 24–184)
Protein/Creatinine Ratio: 0.055 mg/mg{creat} (ref 0.024–0.184)
Total Protein, Urine: 9 mg/dL (ref 5–24)

## 2024-08-29 LAB — COMPREHENSIVE METABOLIC PANEL WITH GFR
AG Ratio: 1.7 (calc) (ref 1.0–2.5)
ALT: 20 U/L (ref 6–29)
AST: 26 U/L (ref 10–35)
Albumin: 4.5 g/dL (ref 3.6–5.1)
Alkaline phosphatase (APISO): 75 U/L (ref 37–153)
BUN: 19 mg/dL (ref 7–25)
CO2: 29 mmol/L (ref 20–32)
Calcium: 9.5 mg/dL (ref 8.6–10.4)
Chloride: 104 mmol/L (ref 98–110)
Creat: 0.94 mg/dL (ref 0.50–1.03)
Globulin: 2.7 g/dL (ref 1.9–3.7)
Glucose, Bld: 104 mg/dL — ABNORMAL HIGH (ref 65–99)
Potassium: 4.6 mmol/L (ref 3.5–5.3)
Sodium: 140 mmol/L (ref 135–146)
Total Bilirubin: 0.3 mg/dL (ref 0.2–1.2)
Total Protein: 7.2 g/dL (ref 6.1–8.1)
eGFR: 72 mL/min/1.73m2 (ref >=60)

## 2024-08-29 LAB — RNP ANTIBODY: Ribonucleic Protein(ENA) Antibody, IgG: 1.3 AI — AB

## 2024-08-29 LAB — SEDIMENTATION RATE: Sed Rate: 19 mm/h (ref 0–30)

## 2024-08-29 LAB — ANA: Anti Nuclear Antibody (ANA): NEGATIVE

## 2024-08-29 LAB — ANTI-DNA ANTIBODY, DOUBLE-STRANDED: ds DNA Ab: 1 [IU]/mL

## 2024-08-29 LAB — C3 AND C4
C3 Complement: 139 mg/dL (ref 83–193)
C4 Complement: 17 mg/dL (ref 15–57)

## 2024-08-29 NOTE — Progress Notes (Signed)
 ANA is negative.

## 2024-08-29 NOTE — Progress Notes (Signed)
 CMP WNL. CBC WNL ESR WNL Urine protein creatinine ratio WNL RNP remains positive.   dsDNA negative  Complements WNL Labs are not consistent with a flare.

## 2024-10-01 ENCOUNTER — Encounter: Payer: Self-pay | Admitting: Diagnostic Neuroimaging

## 2024-10-01 ENCOUNTER — Ambulatory Visit: Admitting: Diagnostic Neuroimaging

## 2024-10-01 VITALS — BP 128/88 | HR 68 | Ht 67.0 in | Wt 234.0 lb

## 2024-10-01 DIAGNOSIS — R4789 Other speech disturbances: Secondary | ICD-10-CM

## 2024-10-01 DIAGNOSIS — R4189 Other symptoms and signs involving cognitive functions and awareness: Secondary | ICD-10-CM | POA: Diagnosis not present

## 2024-10-01 NOTE — Patient Instructions (Signed)
 memory, attention diff; olfactory hallucination (smoke smell) --> since ~2023 - check MRI brain, EEG, sleep study - try to stay active physically and get some exercise (at least 15-30 minutes per day) - eat a nutritious diet with lean protein, plants / vegetables, whole grains; avoid ultra-processed foods - increase social activities, brain stimulation, games, puzzles, hobbies, crafts, arts, music; try new activities; keep it fun! - aim for at least 7-8 hours sleep per night (or more) - avoid smoking and alcohol - caution with medications, finances, driving - safety / supervision issues reviewed

## 2024-10-01 NOTE — Progress Notes (Signed)
 [facility]  PATIENT: Lindsay Sanchez DOB: 06-09-1971  REFERRING CLINICIAN: Cheryl Waddell HERO, PA-C HISTORY FROM: patient  REASON FOR VISIT: new consult   HISTORICAL  CHIEF COMPLAINT:  Chief Complaint  Patient presents with   RM 6     Patient is here alone for memory concerns - started to notice it 7-8 months ago. Husband has stated it has been longer than that.    HISTORY OF PRESENT ILLNESS:   53 year old female with mixed connective tissue disorder, diagnosed 1 year ago, here for evaluation of difficulty staying on task, decreased focus and attention, word finding difficulties and intermittent smoke smell hallucinations.  Patient having some issues with sleep and insomnia averaging 6 to 7 hours of sleep sometimes but as little as 3 to 4 hours on other nights.  Having some issues with pain and discomfort related to mixed connective tissue disorder.  No major changes in ADLs.   REVIEW OF SYSTEMS: Full 14 system review of systems performed and negative with exception of: as per HPI.  ALLERGIES: Allergies  Allergen Reactions   Bee Pollen Dermatitis, Other (See Comments) and Shortness Of Breath    bee pollen   Bee Venom Anaphylaxis   Empagliflozin     Excessive urination  Other Reaction(s): Not available  empagliflozin   Metformin Diarrhea    metformin   Metformin And Related     Diarrhea, intermittent. Leg numbness.   Other     Ragweed and Pollen   Pioglitazone     Itching everywhere  Other Reaction(s): Not available  pioglitazone   Adhesive [Tape] Rash    And itching (when left on for extended time) and causes dermatitis     HOME MEDICATIONS: Outpatient Medications Prior to Visit  Medication Sig Dispense Refill   Accu-Chek FastClix Lancets MISC Use as directed 102 each 3   albuterol  (PROVENTIL  HFA;VENTOLIN  HFA) 108 (90 Base) MCG/ACT inhaler Inhale 2 puffs into the lungs every 6 (six) hours as needed. 1 Inhaler 0   Blood Glucose Monitoring  Suppl (CONTOUR BLOOD GLUCOSE SYSTEM) w/Device KIT 1 strip by Does not apply route daily. 1 kit 0   budesonide -formoterol  (SYMBICORT ) 160-4.5 MCG/ACT inhaler Inhale 2 puffs into the lungs daily.     cetirizine (ZYRTEC) 10 MG tablet Take 10 mg by mouth as needed.     Cyanocobalamin (VITAMIN B12 PO) Take by mouth.     estradiol  (ESTRACE ) 2 MG tablet Take 1 tablet (2 mg total) by mouth daily. 90 tablet 1   glucose blood (ACCU-CHEK GUIDE) test strip USE TO CHECK BLOOD SUGAR ONCE DAILY IN THE MORNING 100 each 3   losartan  (COZAAR ) 25 MG tablet Take 1 tablet by mouth once daily 90 tablet 1   MAGNESIUM PO Take by mouth daily.     montelukast  (SINGULAIR ) 10 MG tablet TAKE 1 TABLET BY MOUTH AT BEDTIME 90 tablet 1   MOUNJARO  15 MG/0.5ML Pen INJECT 15 MG INTO THE SKIN ONCE A WEEK 6 mL 3   Multiple Vitamins-Minerals (PRESERVISION AREDS PO) Take by mouth daily.     OVER THE COUNTER MEDICATION Xylitol melts-at bedtime for dry mouth     pregabalin  (LYRICA ) 50 MG capsule TAKE 1 CAPSULE BY MOUTH THREE TIMES DAILY 90 capsule 1   rosuvastatin  (CRESTOR ) 5 MG tablet Take 1 tablet by mouth once daily 90 tablet 1   Spacer/Aero-Holding Chambers (AEROCHAMBER PLUS) inhaler Use as instructed 1 each 0   VITAMIN D PO Take 5,000 Units by mouth daily.  diclofenac  (VOLTAREN ) 75 MG EC tablet Take 1 tablet by mouth twice daily 60 tablet 5   hydroxychloroquine  (PLAQUENIL ) 200 MG tablet Take 1 tablet by mouth twice daily (Patient not taking: Reported on 08/27/2024) 180 tablet 0   tirzepatide  (MOUNJARO ) 15 MG/0.5ML Pen Inject 15 mg into the skin once a week. (Patient not taking: Reported on 10/01/2024) 6 mL 5   No facility-administered medications prior to visit.    PAST MEDICAL HISTORY: Past Medical History:  Diagnosis Date   Anxiety    anxiety attacks are infrequent   Asthma    Chest tightness    Diabetes (HCC)    Dry mouth    patient states her dentist diagnosed iwth dry mouth-uses Xylitol tabs qhs   Macular  degeneration    per patient-treated by Dr Octavia   Meralgia paresthetica of left side 12/12/2015   Obesity 09/27/2012   Urinary frequency     PAST SURGICAL HISTORY: Past Surgical History:  Procedure Laterality Date   CESAREAN SECTION     FOOT SURGERY Right    cyst removal   HAMMERTOE RECONSTRUCTION WITH WEIL OSTEOTOMY Right 12/25/2015   Procedure: RIGHT THIRD HAMMERTOE CORRECTION, WEIL OSTEOTOMY;  Surgeon: Norleen Armor, MD;  Location: [facility];  Service: Orthopedics;  Laterality: Right;   HAND SURGERY Bilateral    HARDWARE REMOVAL Right 12/25/2015   Procedure: RIGHT FIFTH METATARSAL DEEP IMPLANT REMOVAL, EXOSTECTOMY;  Surgeon: Norleen Armor, MD;  Location: [facility];  Service: Orthopedics;  Laterality: Right;   MOUTH SURGERY     [facility] --removal of cyst per patient-awaiting path results   NECK SURGERY     cervical fusion   ROOT CANAL     TONSILLECTOMY     TOTAL ABDOMINAL HYSTERECTOMY     precancer cells on pap smear; thinks left ovar remains    FAMILY HISTORY: Family History  Problem Relation Age of Onset   Arrhythmia Mother        V TACH   Ovarian cancer Mother    Breast cancer Mother 104   Atrial fibrillation Mother    Heart attack Father 41   Sudden Cardiac Death Brother    Heart attack Brother    Healthy Brother    Stroke Maternal Grandfather    Heart attack Maternal Grandfather    Cancer Maternal Grandfather    Stroke Paternal Grandmother    Heart disease Paternal Grandmother    Anemia Daughter    Obesity Daughter    Other Daughter        polyps in uterus   Seizures Son    Epilepsy Son        as a child   Breast cancer Maternal Aunt    Breast cancer Maternal Aunt    Heart attack Paternal Uncle    Diabetes Paternal Uncle    Cancer Cousin    Heart attack Cousin    Heart attack Cousin    Heart attack Cousin    Heart attack Cousin    Heart attack Cousin     SOCIAL HISTORY: Social History   Socioeconomic  History   Marital status: Married    Spouse name: Not on file   Number of children: 2   Years of education: 14   Highest education level: Associate degree: occupational, scientist, product/process development, or vocational program  Occupational History   Occupation: therapist, nutritional  Tobacco Use   Smoking status: Never    Passive exposure: Past   Smokeless tobacco: Never  Advertising Account Planner  Vaping status: Never Used  Substance and Sexual Activity   Alcohol use: Yes    Comment: occasional   Drug use: No   Sexual activity: Not on file  Other Topics Concern   Not on file  Social History Narrative   Patient drinks about 1 cup of caffeine daily.   Patient is right handed.    Social Drivers of Corporate Investment Banker Strain: Low Risk  (06/25/2024)   Overall Financial Resource Strain (CARDIA)    Difficulty of Paying Living Expenses: Not very hard  Food Insecurity: No Food Insecurity (06/25/2024)   Hunger Vital Sign    Worried About Running Out of Food in the Last Year: Never true    Ran Out of Food in the Last Year: Never true  Transportation Needs: No Transportation Needs (06/25/2024)   PRAPARE - Administrator, Civil Service (Medical): No    Lack of Transportation (Non-Medical): No  Physical Activity: Inactive (06/25/2024)   Exercise Vital Sign    Days of Exercise per Week: 0 days    Minutes of Exercise per Session: Not on file  Stress: No Stress Concern Present (06/25/2024)   [employer] of Occupational Health - Occupational Stress Questionnaire    Feeling of Stress: Only a little  Social Connections: Moderately Isolated (06/25/2024)   Social Connection and Isolation Panel    Frequency of Communication with Friends and Family: More than three times a week    Frequency of Social Gatherings with Friends and Family: Three times a week    Attends Religious Services: Never    Active Member of Clubs or Organizations: No    Attends Engineer, Structural: Not on file    Marital Status: Married   Catering Manager Violence: Not on file     PHYSICAL EXAM  GENERAL EXAM/CONSTITUTIONAL: Vitals:  Vitals:   10/01/24 1028  BP: 128/88  Pulse: 68  SpO2: 98%  Weight: 234 lb (106.1 kg)  Height: 5' 7 (1.702 m)   Body mass index is 36.65 kg/m. Wt Readings from Last 3 Encounters:  10/01/24 234 lb (106.1 kg)  08/27/24 237 lb 9.6 oz (107.8 kg)  06/29/24 236 lb 14.4 oz (107.5 kg)   Patient is in no distress; well developed, nourished and groomed; neck is supple  CARDIOVASCULAR: Examination of carotid arteries is normal; no carotid bruits Regular rate and rhythm, no murmurs Examination of peripheral vascular system by observation and palpation is normal  EYES: Ophthalmoscopic exam of optic discs and posterior segments is normal; no papilledema or hemorrhages No results found.  MUSCULOSKELETAL: Gait, strength, tone, movements noted in Neurologic exam below  NEUROLOGIC: MENTAL STATUS:      No data to display            10/01/2024   10:40 AM  Montreal Cognitive Assessment   Visuospatial/ Executive (0/5) 4  Naming (0/3) 3  Attention: Read list of digits (0/2) 2  Attention: Read list of letters (0/1) 0  Attention: Serial 7 subtraction starting at 100 (0/3) 3  Language: Repeat phrase (0/2) 2  Language : Fluency (0/1) 1  Abstraction (0/2) 2  Delayed Recall (0/5) 4  Orientation (0/6) 6  Total 27    awake, alert, oriented to person, place and time recent and remote memory intact normal attention and concentration language fluent, comprehension intact, naming intact fund of knowledge appropriate  CRANIAL NERVE:  2nd - no papilledema on fundoscopic exam 2nd, 3rd, 4th, 6th - pupils equal and reactive to  light, visual fields full to confrontation, extraocular muscles intact, no nystagmus 5th - facial sensation symmetric 7th - facial strength symmetric 8th - hearing intact 9th - palate elevates symmetrically, uvula midline 11th - shoulder shrug symmetric 12th -  tongue protrusion midline  MOTOR:  normal bulk and tone, full strength in the BUE, BLE  SENSORY:  normal and symmetric to light touch, temperature, vibration  COORDINATION:  finger-nose-finger, fine finger movements normal  REFLEXES:  deep tendon reflexes present and symmetric  GAIT/STATION:  narrow based gait     DIAGNOSTIC DATA (LABS, IMAGING, TESTING) - I reviewed patient records, labs, notes, testing and imaging myself where available.  Lab Results  Component Value Date   WBC 5.4 08/27/2024   HGB 12.9 08/27/2024   HCT 38.7 08/27/2024   MCV 91.7 08/27/2024   PLT 205 08/27/2024      Component Value Date/Time   NA 140 08/27/2024 1508   K 4.6 08/27/2024 1508   CL 104 08/27/2024 1508   CO2 29 08/27/2024 1508   GLUCOSE 104 (H) 08/27/2024 1508   BUN 19 08/27/2024 1508   CREATININE 0.94 08/27/2024 1508   CALCIUM  9.5 08/27/2024 1508   PROT 7.2 08/27/2024 1508   ALBUMIN 4.4 05/23/2023 1425   AST 26 08/27/2024 1508   ALT 20 08/27/2024 1508   ALKPHOS 68 05/23/2023 1425   BILITOT 0.3 08/27/2024 1508   GFRNONAA >60 12/19/2015 1215   GFRAA >60 12/19/2015 1215   Lab Results  Component Value Date   CHOL 168 06/29/2024   HDL 74.20 06/29/2024   LDLCALC 78 06/29/2024   TRIG 80.0 06/29/2024   CHOLHDL 2 06/29/2024   Lab Results  Component Value Date   HGBA1C 5.6 06/29/2024   No results found for: VITAMINB12 Lab Results  Component Value Date   TSH 1.11 10/28/2023      ASSESSMENT AND PLAN  53 y.o. year old female here with subjective memory and cognitive complaints since 2024 with normal cognitive testing (MoCA 27/30) and no changes in ADLs.  Having some intermittent olfactory hallucinations and will proceed with further workup to rule out other causes.   Dx:  1. Brain fog   2. Difficulty paying attention   3. Word finding difficulty     PLAN:  memory, attention diff; olfactory hallucination (smoke smell) --> since ~2023 - check MRI brain (rule out  stroke, vascular, mass, demyelinating disease) - check EEG (rule out complex partial seizure) - check sleep study (BMI 36; rule out OSA) - try to stay active physically and get some exercise (at least 15-30 minutes per day) - eat a nutritious diet with lean protein, plants / vegetables, whole grains; avoid ultra-processed foods - increase social activities, brain stimulation, games, puzzles, hobbies, crafts, arts, music; try new activities; keep it fun! - aim for at least 7-8 hours sleep per night (or more) - avoid smoking and alcohol - caution with medications, finances, driving - safety / supervision issues reviewed  Orders Placed This Encounter  Procedures   MR BRAIN W WO CONTRAST   Ambulatory referral to Sleep Studies   EEG adult   Return for pending test results, pending if symptoms worsen or fail to improve.  I reviewed labs, notes, records myself. I summarized findings and reviewed with patient, for this high risk condition (stroke, seizure evaluation) requiring high complexity decision making.    EDUARD FABIENE HANLON, MD 10/01/2024, 11:03 AM Certified in Neurology, Neurophysiology and Neuroimaging  [facility] [address], [address] [place],  [place] [address] [phone]

## 2024-10-05 ENCOUNTER — Ambulatory Visit: Admitting: Diagnostic Neuroimaging

## 2024-10-05 DIAGNOSIS — R4789 Other speech disturbances: Secondary | ICD-10-CM

## 2024-10-05 DIAGNOSIS — R4182 Altered mental status, unspecified: Secondary | ICD-10-CM | POA: Diagnosis not present

## 2024-10-05 DIAGNOSIS — R4189 Other symptoms and signs involving cognitive functions and awareness: Secondary | ICD-10-CM

## 2024-10-09 ENCOUNTER — Ambulatory Visit: Payer: Self-pay | Admitting: Diagnostic Neuroimaging

## 2024-10-09 ENCOUNTER — Ambulatory Visit

## 2024-10-09 DIAGNOSIS — R4189 Other symptoms and signs involving cognitive functions and awareness: Secondary | ICD-10-CM | POA: Diagnosis not present

## 2024-10-09 DIAGNOSIS — R4789 Other speech disturbances: Secondary | ICD-10-CM

## 2024-10-09 MED ORDER — GADOBENATE DIMEGLUMINE 529 MG/ML IV SOLN
20.0000 mL | Freq: Once | INTRAVENOUS | Status: AC | PRN
  Administered 2024-10-09: 20 mL via INTRAVENOUS

## 2024-10-13 NOTE — Procedures (Signed)
   [facility]  EEG (ELECTROENCEPHALOGRAM) REPORT   STUDY DATE: 10/05/24 PATIENT NAME: Lindsay Sanchez DOB: [birth year] MRN: [id number]  ORDERING CLINICIAN: Eduard Hanlon, MD   TECHNOLOGIST: MARLA Plummer TECHNIQUE: Electroencephalogram was recorded utilizing standard 10-20 system of lead placement and reformatted into average and bipolar montages.  RECORDING TIME: 27 minutes ACTIVATION: hyperventilation and photic stimulation  CLINICAL INFORMATION: 53 year old female with word finding difficulty and olfactory hallucinations.  FINDINGS: Posterior dominant background rhythms, which attenuate with eye opening, ranging 12-13 hertz and 40-50 microvolts. No focal, lateralizing, epileptiform activity or seizures are seen. Patient recorded in the awake and drowsy state. EKG channel shows regular rhythm of 65-70 beats per minute.   IMPRESSION:   Normal EEG in the awake and drowsy states.     INTERPRETING PHYSICIAN:  EDUARD FABIENE HANLON, MD Certified in Neurology, Neurophysiology and Neuroimaging  [facility] [address], [address] [place], [place] [address] [phone]

## 2024-10-15 ENCOUNTER — Ambulatory Visit: Payer: Self-pay | Admitting: *Deleted

## 2024-10-15 ENCOUNTER — Encounter: Payer: Self-pay | Admitting: Family Medicine

## 2024-10-15 ENCOUNTER — Telehealth (INDEPENDENT_AMBULATORY_CARE_PROVIDER_SITE_OTHER): Admitting: Family Medicine

## 2024-10-15 VITALS — Temp 101.0°F | Wt 226.0 lb

## 2024-10-15 DIAGNOSIS — U071 COVID-19: Secondary | ICD-10-CM | POA: Diagnosis not present

## 2024-10-15 MED ORDER — NIRMATRELVIR/RITONAVIR (PAXLOVID)TABLET: 3.0000 | ORAL_TABLET | Freq: Two times a day (BID) | ORAL | 0 refills | Status: AC

## 2024-10-15 NOTE — Telephone Encounter (Signed)
 Virtual visit today at 3pm.

## 2024-10-15 NOTE — Progress Notes (Signed)
   Virtual Medical Office Visit  Patient:  Lindsay Sanchez      Age: 53 y.o.       Sex:  female  Date:   10/15/2024  PCP:    Ozell Heron HERO, MD   Today's Healthcare Provider: Heron HERO Ozell, MD    Assessment/Plan:   Summary assessment:  Lindsay Sanchez was seen today for covid positive, headache and fever.  COVID-19 -     nirmatrelvir /ritonavir ; Take 3 tablets by mouth 2 (two) times daily for 5 days. (Take nirmatrelvir  150 mg two tablets twice daily for 5 days and ritonavir  100 mg one tablet twice daily for 5 days) Patient GFR is 72  Dispense: 30 tablet; Refill: 0   Patient has diabetes and obesity, risk factors for COVID, will treat with paxlovid  therapy, recommend pt stay isolated at home for the next 5-7 days. Then wear masks for the 7 days following to reduce the transmission. Follow up PRN  No follow-ups on file.   She was advised to call the office or go to ER if her condition worsens    Subjective:   Lindsay Sanchez is a 53 y.o. female with PMH significant for: Past Medical History:  Diagnosis Date   Anxiety    anxiety attacks are infrequent   Asthma    Chest tightness    Diabetes (HCC)    Dry mouth    patient states her dentist diagnosed iwth dry mouth-uses Xylitol tabs qhs   Macular degeneration    per patient-treated by Dr Octavia   Meralgia paresthetica of left side 12/12/2015   Obesity 09/27/2012   Urinary frequency      Presenting today with: Chief Complaint  Patient presents with   Covid Positive    Patient states the home Covid test was positive this morning   Headache    X1 day, tried Tylenol  with no relief   Fever    Noted over night     She clarifies and reports that her condition: COVID positive-- fever and chills, sore throat, headache, etc. No chest pain or SOB, feels like her head feels heavy, weakness, body aches.   She denies having any: Chest pain, SOB, dizziness or passing out.           Objective/Observations  Physical  Exam:  Polite and friendly Gen: NAD, resting comfortably Pulm: Normal work of breathing Neuro: Grossly normal, moves all extremities Psych: Normal affect and thought content Problem specific physical exam findings:  N/A  No images are attached to the encounter or orders placed in the encounter.    Results: No results found for any visits on 10/15/24.         Virtual Visit via Video   I connected with Lindsay Sanchez on 10/15/24 at  3:00 PM EST by a video enabled telemedicine application and verified that I am speaking with the correct person using two identifiers. The limitations of evaluation and management by telemedicine and the availability of in person appointments were discussed. The patient expressed understanding and agreed to proceed.   Percentage of appointment time on video:  100% Patient location: Home Provider location: [facility] Persons participating in the virtual visit: Myself and Patient

## 2024-10-15 NOTE — Telephone Encounter (Signed)
 FYI Only or Action Required?: FYI only for provider: appointment scheduled on 10/15/24 VV and covid positive test today sx started yesterday .  Patient was last seen in primary care on 06/29/2024 by Ozell Heron HERO, MD.  Called Nurse Triage reporting Covid Positive.  Symptoms began yesterday.  Interventions attempted: Rest, hydration, or home remedies.  Symptoms are: gradually worsening.  Triage Disposition: Call PCP Within 24 Hours  Patient/caregiver understands and will follow disposition?: Yes               Copied from CRM #[id number]. Topic: Clinical - Red Word Triage >> Oct 15, 2024 10:00 AM Lindsay Sanchez wrote: Red Word that prompted transfer to Nurse Triage: tested positive for Covid, headache and a lot of congestion and fever Reason for Disposition  [1] HIGH RISK patient (e.g., weak immune system, 65 years and older, obesity with BMI 30 or higher, pregnant, chronic lung disease) AND [2] COVID symptoms (e.g., cough, fever)  (Exceptions: Already seen by PCP and no new or worsening symptoms.)  Answer Assessment - Initial Assessment Questions 1. SYMPTOMS: What is your main symptom or concern? (e.g., cough, fever, shortness of breath, muscle aches)     Fever , headache ,cough 2. ONSET: When did the symptoms start?      Yesterday  3. COUGH: Do you have a cough? If Yes, ask: How bad is the cough?       Yes worsening 4. FEVER: Do you have a fever? If Yes, ask: What is your temperature, how was it measured, and when did it start?     100.1 5. BREATHING DIFFICULTY: Are you having any difficulty breathing? (e.g., normal; shortness of breath, wheezing, unable to speak)      *No Answer* 6. BETTER-SAME-WORSE: Are you getting better, staying the same or getting worse compared to yesterday?  If getting worse, ask, In what way?     *No Answer* 7. OTHER SYMPTOMS: Do you have any other symptoms?  (e.g., chills, fatigue, headache, loss of smell or taste, muscle pain,  sore throat)     Headache , chills, cough light green , congestion 8. COVID-19 DIAGNOSIS: How do you know that you have COVID? (e.g., positive lab test or self-test, diagnosed by doctor or NP/PA, symptoms after exposure).     Positive  9. COVID-19 EXPOSURE: Was there any known exposure to COVID before the symptoms began?      *No Answer* 10. COVID-19 VACCINE: Have you had the COVID-19 vaccine? If Yes, ask: When did you last get it?       *No Answer* 11. HIGH RISK DISEASE: Do you have any chronic medical problems? (e.g., asthma, heart or lung disease, weak immune system, obesity, etc.)       Hx DM  12. PREGNANCY: Is there any chance you are pregnant? When was your last menstrual period?       na 13. O2 SATURATION MONITOR:  Do you use an oxygen saturation monitor (pulse oximeter) at home? If Yes, ask What is your reading (oxygen level) today? What is your usual oxygen saturation reading? (e.g., 95%)       na  Protocols used: COVID-19 - Diagnosed or Suspected-A-AH

## 2024-10-26 ENCOUNTER — Other Ambulatory Visit: Payer: Self-pay | Admitting: Physician Assistant

## 2024-10-26 DIAGNOSIS — M351 Other overlap syndromes: Secondary | ICD-10-CM

## 2024-10-26 NOTE — Telephone Encounter (Deleted)
 Last Fill: 07/30/2024  Eye exam: 03/12/2024 Normal   Labs: 08/27/2024 CMP WNL. CBC WNL   Next Visit: 11/30/2024  Last Visit: 08/27/2024  DX: MCTD   Current Dose per office note ***:   Okay to refill Plaquenil ?

## 2024-11-16 NOTE — Progress Notes (Signed)
 "  Office Visit Note  Patient: Lindsay Sanchez             Date of Birth: 1971/05/20           MRN: [id number]             PCP: Ozell Heron HERO, MD Referring: Ozell Heron HERO, MD Visit Date: 11/30/2024 Occupation: Data Unavailable  Subjective:  Pain in both hands    History of Present Illness: Lindsay Sanchez is a 53 y.o. female with history of mixed connective tissue disease and osteoarthritis.  Patient is not currently taking immunosuppressive agents.  Patient is been experiencing increased pain and stiffness affecting both hands especially the right hand.  She has had difficulty with full flexion of the right fourth digit.  Her morning stiffness has been lasting for up to 45 minutes daily.  She has been using hand warmers which she has found to be helpful.  Overall she feels that the increased hand pain is due to colder weather temperatures.  She has been taking pregabalin  50 mg 3 times daily every other day which she has found to be helpful. Patient denies any new or worsening pulmonary symptoms.  She remains under the care of Dr. Darlean.  Patient states that she was evaluated by Dr. Margaret for brain fog.  Patient was advised that the brain fog was likely related to menopause.  Patient states that she is scheduled for a sleep study on 12/20/2024.    Activities of Daily Living:  Patient reports morning stiffness for 45 minutes.   Patient Reports nocturnal pain.  Difficulty dressing/grooming: Denies Difficulty climbing stairs: Reports Difficulty getting out of chair: Denies Difficulty using hands for taps, buttons, cutlery, and/or writing: Reports  Review of Systems  Constitutional:  Positive for fatigue.  HENT:  Positive for mouth dryness. Negative for mouth sores.   Eyes:  Positive for dryness.  Respiratory:  Negative for shortness of breath.   Cardiovascular:  Negative for chest pain and palpitations.  Gastrointestinal:  Negative for blood in stool, constipation and  diarrhea.  Endocrine: Negative for increased urination.  Genitourinary:  Positive for involuntary urination.  Musculoskeletal:  Positive for joint pain, gait problem, joint pain, myalgias, muscle weakness, morning stiffness and myalgias. Negative for joint swelling and muscle tenderness.  Skin:  Positive for rash. Negative for color change, hair loss and sensitivity to sunlight.  Allergic/Immunologic: Positive for susceptible to infections.  Neurological:  Negative for dizziness and headaches.  Hematological:  Negative for swollen glands.  Psychiatric/Behavioral:  Positive for sleep disturbance. Negative for depressed mood. The patient is not nervous/anxious.     PMFS History:  Patient Active Problem List   Diagnosis Date Noted   DOE (dyspnea on exertion) 02/10/2024   Ear pain 04/04/2023   Elevated blood pressure reading 04/04/2023   Allergic rhinitis 06/18/2022   Mild intermittent asthma without complication 07/25/2020   Depression, recurrent 07/25/2020   Morbid obesity (HCC) 03/08/2016   Meralgia paresthetica of left side 12/12/2015   Obesity 09/27/2012   Family history of coronary artery disease 09/27/2012   Controlled type 2 diabetes mellitus with complication, without long-term current use of insulin (HCC)     Past Medical History:  Diagnosis Date   Anxiety    anxiety attacks are infrequent   Asthma    Chest tightness    Diabetes (HCC)    Dry mouth    patient states her dentist diagnosed iwth dry mouth-uses Xylitol tabs qhs   Macular degeneration  per patient-treated by Dr Octavia   Meralgia paresthetica of left side 12/12/2015   Obesity 09/27/2012   Urinary frequency     Family History  Problem Relation Age of Onset   Arrhythmia Mother        V TACH   Ovarian cancer Mother    Breast cancer Mother 38   Atrial fibrillation Mother    Heart attack Father 89   Sudden Cardiac Death Brother    Heart attack Brother    Healthy Brother    Stroke Maternal Grandfather     Heart attack Maternal Grandfather    Cancer Maternal Grandfather    Stroke Paternal Grandmother    Heart disease Paternal Grandmother    Anemia Daughter    Obesity Daughter    Other Daughter        polyps in uterus   Seizures Son    Epilepsy Son        as a child   Breast cancer Maternal Aunt    Breast cancer Maternal Aunt    Heart attack Paternal Uncle    Diabetes Paternal Uncle    Cancer Cousin    Heart attack Cousin    Heart attack Cousin    Heart attack Cousin    Heart attack Cousin    Heart attack Cousin    Past Surgical History:  Procedure Laterality Date   CESAREAN SECTION     FOOT SURGERY Right    cyst removal   HAMMERTOE RECONSTRUCTION WITH WEIL OSTEOTOMY Right 12/25/2015   Procedure: RIGHT THIRD HAMMERTOE CORRECTION, WEIL OSTEOTOMY;  Surgeon: Norleen Armor, MD;  Location: [facility];  Service: Orthopedics;  Laterality: Right;   HAND SURGERY Bilateral    HARDWARE REMOVAL Right 12/25/2015   Procedure: RIGHT FIFTH METATARSAL DEEP IMPLANT REMOVAL, EXOSTECTOMY;  Surgeon: Norleen Armor, MD;  Location: [facility];  Service: Orthopedics;  Laterality: Right;   MOUTH SURGERY     [facility]--removal of cyst per patient-awaiting path results   NECK SURGERY     cervical fusion   ROOT CANAL     TONSILLECTOMY     TOTAL ABDOMINAL HYSTERECTOMY     precancer cells on pap smear; thinks left ovar remains   Social History[1] Social History   Social History Narrative   Patient drinks about 1 cup of caffeine daily.   Patient is right handed.      Immunization History  Administered Date(s) Administered   Influenza,inj,Quad PF,6+ Mos 08/25/2018, 08/08/2020, 08/14/2021, 08/27/2022   Influenza-Unspecified 08/31/2020, 09/01/2022, 07/22/2023   Moderna Covid-19 Vaccine Bivalent Booster 3yrs & up 07/22/2023   PFIZER(Purple Top)SARS-COV-2 Vaccination 04/25/2020, 05/22/2020, 11/30/2020, 11/30/2020   Pneumococcal Conjugate,unspecified  12/29/2022   Pneumococcal Polysaccharide-23 07/20/2018   Td 07/20/2018   Zoster Recombinant(Shingrix ) 03/22/2022, 06/18/2022, 06/29/2022, 12/28/2022     Objective: Vital Signs: BP 135/84   Pulse 76   Temp 98.1 F (36.7 C)   Resp 14   Ht 5' 7 (1.702 m)   Wt 238 lb 9.6 oz (108.2 kg)   BMI 37.37 kg/m    Physical Exam Vitals and nursing note reviewed.  Constitutional:      Appearance: She is well-developed.  HENT:     Head: Normocephalic and atraumatic.  Eyes:     Conjunctiva/sclera: Conjunctivae normal.  Cardiovascular:     Rate and Rhythm: Normal rate and regular rhythm.     Heart sounds: Normal heart sounds.  Pulmonary:     Effort: Pulmonary effort is normal.  Breath sounds: Normal breath sounds.  Abdominal:     General: Bowel sounds are normal.     Palpations: Abdomen is soft.  Musculoskeletal:     Cervical back: Normal range of motion.  Lymphadenopathy:     Cervical: No cervical adenopathy.  Skin:    General: Skin is warm and dry.     Capillary Refill: Capillary refill takes less than 2 seconds.  Neurological:     Mental Status: She is alert and oriented to person, place, and time.  Psychiatric:        Behavior: Behavior normal.      Musculoskeletal Exam: C-spine has slightly limited ROM with lateral rotation.  Thoracic spine and lumbar spine have good range of motion.  No midline spinal tenderness.  No SI joint tenderness.  Shoulder joints, elbow joints, wrist joints, MCPs, PIPs, DIPs have good range of motion with no synovitis. Tenderness of the right 4th PIP joint. Complete fist formation bilaterally.  Hip joints have good range of motion with no groin pain.  Knee joints have good range of motion no warmth or effusion.  Ankle joints have good range of motion no tenderness or joint swelling.     CDAI Exam: CDAI Score: -- Patient Global: --; Provider Global: -- Swollen: --; Tender: -- Joint Exam 11/30/2024   No joint exam has been documented for this  visit   There is currently no information documented on the homunculus. Go to the Rheumatology activity and complete the homunculus joint exam.  Investigation: No additional findings.  Imaging: No results found.  Recent Labs: Lab Results  Component Value Date   WBC 5.4 08/27/2024   HGB 12.9 08/27/2024   PLT 205 08/27/2024   NA 140 08/27/2024   K 4.6 08/27/2024   CL 104 08/27/2024   CO2 29 08/27/2024   GLUCOSE 104 (H) 08/27/2024   BUN 19 08/27/2024   CREATININE 0.94 08/27/2024   BILITOT 0.3 08/27/2024   ALKPHOS 68 05/23/2023   AST 26 08/27/2024   ALT 20 08/27/2024   PROT 7.2 08/27/2024   ALBUMIN 4.4 05/23/2023   CALCIUM  9.5 08/27/2024   GFRAA >60 12/19/2015    Speciality Comments: PLQ Eye Exam: 03/12/2024 Normal @ [facility] Follow up in 1 year  Procedures:  No procedures performed Allergies: Bee pollen, Bee venom, Empagliflozin, Metformin, Metformin and related, Other, Pioglitazone, and Adhesive [tape]   Assessment / Plan:     Visit Diagnoses: MCTD (mixed connective tissue disease) - ANA negative, positive RNP, positive SSA, positive SSB, sicca symptoms, fatigue, hair loss, Raynaud's: She is not currently taking immunosuppressive agents.  She had an attic response to Plaquenil  in the past.  She has not necessarily noticed any new or worsening symptoms since discontinuing Plaquenil .  She does note some increased arthralgias and joint stiffness affecting both hands but attributes this to cooler weather temperatures.  She has been using hand warmers that should have been helpful.  She has no synovitis on examination today.  Discussed that if her symptoms persist or worsen I would recommend scheduling an ultrasound to assess for synovitis. Encourage patient to remain up-to-date with routine follow-up with cardiology and pulmonology. Lab work from 08/27/2024 was reviewed today in the office: RNP remains +1.3, ANA negative, double-stranded ENA negative, ESR within  normal limits, complements within normal limits, urine protein creatinine ratio within normal limits.   Plan to obtain the following lab work today for further evaluation.  She was advised to notify us  if she develops  any new or worsening symptoms.  She will follow-up in the office in 3 months or sooner if needed. - Plan: CBC with Differential/Platelet, Comprehensive metabolic panel with GFR, Sedimentation rate, C3 and C4, Protein / creatinine ratio, urine, Anti-DNA antibody, double-stranded, ANA, RNP Antibody  High risk medication use - Plaquenil -inadequate response. Previously taking Plaquenil  200 mg 1 tablet BID-started early January 2025. - Plan: CBC with Differential/Platelet, Comprehensive metabolic panel with GFR  Raynaud's syndrome without gangrene: She has been using hand warmers which have been helpful.  No signs of sclerodactyly or digital ulcerations noted today.  Primary osteoarthritis of both hands: She has PIP and DIP thickening.  No synovitis.  She has noticed increased soreness and stiffness affecting the right fourth PIP joint.  Discussed that if her symptoms persist or worsen we can schedule an ultrasound to assess for synovitis.  History of bilateral carpal tunnel release  Primary osteoarthritis of both knees: Good ROM of both knee joints.  No warmth or effusion noted.   Primary osteoarthritis of both feet - Surgery on her right first third and fifth toes by Dr. Roddie and Dr. Kit.  She is followed by Dr. Roddie.   Plantar fasciitis of right foot - plantar fascia surgery by Dr. Kit.  Right foot x-ray January 30, 2021 showed mild degenerative changes and postsurgical changes.  DDD (degenerative disc disease), cervical: C-spine has limited ROM with lateral rotation.  No symptoms of radiculopathy at this time.  Lumbar spondylosis - Patient has been seeing Dr. Sima on regular basis.  She has filed for disability.  Myalgia: She continues to experience myalgias  involving lower extremities especially if walking or standing for prolonged periods of time.    Other fatigue: Chronic, stable.  Plan to check the following lab work today for further evaluation.  Other medical conditions are listed as follows:  Meralgia paresthetica of left side: Not currently symptomatic.  Controlled type 2 diabetes mellitus with complication, without long-term current use of insulin (HCC)  Mild intermittent asthma without complication  Seasonal allergic rhinitis due to other allergic trigger  Anxiety and depression  Family history of coronary artery disease  Brain fog: Evaluated by Dr. Margaret on 10/01/2024.  Brain fog was felt to be related to menopause.  Orders: Orders Placed This Encounter  Procedures   CBC with Differential/Platelet   Comprehensive metabolic panel with GFR   Sedimentation rate   C3 and C4   Protein / creatinine ratio, urine   Anti-DNA antibody, double-stranded   ANA   RNP Antibody   No orders of the defined types were placed in this encounter.     Follow-Up Instructions: Return in about 3 months (around 02/28/2025) for MCTD.   Lindsay CHRISTELLA Craze, PA-C  Note - This record has been created using Dragon software.  Chart creation errors have been sought, but may not always  have been located. Such creation errors do not reflect on  the standard of medical care.     [1]  Social History Tobacco Use   Smoking status: Never    Passive exposure: Past   Smokeless tobacco: Never  Vaping Use   Vaping status: Never Used  Substance Use Topics   Alcohol use: Yes    Comment: occasional   Drug use: No   "

## 2024-11-19 ENCOUNTER — Institutional Professional Consult (permissible substitution): Admitting: Neurology

## 2024-11-30 ENCOUNTER — Ambulatory Visit: Attending: Physician Assistant | Admitting: Physician Assistant

## 2024-11-30 ENCOUNTER — Encounter: Payer: Self-pay | Admitting: Physician Assistant

## 2024-11-30 VITALS — BP 135/84 | HR 76 | Temp 98.1°F | Resp 14 | Ht 67.0 in | Wt 238.6 lb

## 2024-11-30 DIAGNOSIS — M5030 Other cervical disc degeneration, unspecified cervical region: Secondary | ICD-10-CM | POA: Diagnosis not present

## 2024-11-30 DIAGNOSIS — M19072 Primary osteoarthritis, left ankle and foot: Secondary | ICD-10-CM

## 2024-11-30 DIAGNOSIS — F419 Anxiety disorder, unspecified: Secondary | ICD-10-CM

## 2024-11-30 DIAGNOSIS — R4189 Other symptoms and signs involving cognitive functions and awareness: Secondary | ICD-10-CM

## 2024-11-30 DIAGNOSIS — Z79899 Other long term (current) drug therapy: Secondary | ICD-10-CM

## 2024-11-30 DIAGNOSIS — M722 Plantar fascial fibromatosis: Secondary | ICD-10-CM | POA: Diagnosis not present

## 2024-11-30 DIAGNOSIS — M791 Myalgia, unspecified site: Secondary | ICD-10-CM

## 2024-11-30 DIAGNOSIS — I7300 Raynaud's syndrome without gangrene: Secondary | ICD-10-CM

## 2024-11-30 DIAGNOSIS — J4520 Mild intermittent asthma, uncomplicated: Secondary | ICD-10-CM

## 2024-11-30 DIAGNOSIS — M47816 Spondylosis without myelopathy or radiculopathy, lumbar region: Secondary | ICD-10-CM | POA: Diagnosis not present

## 2024-11-30 DIAGNOSIS — E118 Type 2 diabetes mellitus with unspecified complications: Secondary | ICD-10-CM

## 2024-11-30 DIAGNOSIS — M19041 Primary osteoarthritis, right hand: Secondary | ICD-10-CM

## 2024-11-30 DIAGNOSIS — M351 Other overlap syndromes: Secondary | ICD-10-CM

## 2024-11-30 DIAGNOSIS — F32A Depression, unspecified: Secondary | ICD-10-CM

## 2024-11-30 DIAGNOSIS — Z8249 Family history of ischemic heart disease and other diseases of the circulatory system: Secondary | ICD-10-CM

## 2024-11-30 DIAGNOSIS — M19071 Primary osteoarthritis, right ankle and foot: Secondary | ICD-10-CM

## 2024-11-30 DIAGNOSIS — M170 Bilateral primary osteoarthritis of knee: Secondary | ICD-10-CM

## 2024-11-30 DIAGNOSIS — R5383 Other fatigue: Secondary | ICD-10-CM

## 2024-11-30 DIAGNOSIS — M19042 Primary osteoarthritis, left hand: Secondary | ICD-10-CM

## 2024-11-30 DIAGNOSIS — J3089 Other allergic rhinitis: Secondary | ICD-10-CM

## 2024-11-30 DIAGNOSIS — Z9889 Other specified postprocedural states: Secondary | ICD-10-CM | POA: Diagnosis not present

## 2024-11-30 DIAGNOSIS — G5712 Meralgia paresthetica, left lower limb: Secondary | ICD-10-CM

## 2024-12-02 LAB — CBC WITH DIFFERENTIAL/PLATELET
Absolute Lymphocytes: 1345 {cells}/uL (ref 850–3900)
Absolute Monocytes: 485 {cells}/uL (ref 200–950)
Basophils Absolute: 29 {cells}/uL (ref 0–200)
Basophils Relative: 0.5 %
Eosinophils Absolute: 63 {cells}/uL (ref 15–500)
Eosinophils Relative: 1.1 %
HCT: 38.7 % (ref 35.9–46.0)
Hemoglobin: 12.4 g/dL (ref 11.7–15.5)
MCH: 29.2 pg (ref 27.0–33.0)
MCHC: 32 g/dL (ref 31.6–35.4)
MCV: 91.3 fL (ref 81.4–101.7)
MPV: 10.4 fL (ref 7.5–12.5)
Monocytes Relative: 8.5 %
Neutro Abs: 3779 {cells}/uL (ref 1500–7800)
Neutrophils Relative %: 66.3 %
Platelets: 221 Thousand/uL (ref 140–400)
RBC: 4.24 Million/uL (ref 3.80–5.10)
RDW: 12.7 % (ref 11.0–15.0)
Total Lymphocyte: 23.6 %
WBC: 5.7 Thousand/uL (ref 3.8–10.8)

## 2024-12-02 LAB — COMPREHENSIVE METABOLIC PANEL WITH GFR
AG Ratio: 1.8 (calc) (ref 1.0–2.5)
ALT: 13 U/L (ref 6–29)
AST: 19 U/L (ref 10–35)
Albumin: 4.3 g/dL (ref 3.6–5.1)
Alkaline phosphatase (APISO): 67 U/L (ref 37–153)
BUN: 16 mg/dL (ref 7–25)
CO2: 29 mmol/L (ref 20–32)
Calcium: 9.1 mg/dL (ref 8.6–10.4)
Chloride: 103 mmol/L (ref 98–110)
Creat: 0.76 mg/dL (ref 0.50–1.03)
Globulin: 2.4 g/dL (ref 1.9–3.7)
Glucose, Bld: 101 mg/dL — ABNORMAL HIGH (ref 65–99)
Potassium: 4.8 mmol/L (ref 3.5–5.3)
Sodium: 140 mmol/L (ref 135–146)
Total Bilirubin: 0.5 mg/dL (ref 0.2–1.2)
Total Protein: 6.7 g/dL (ref 6.1–8.1)
eGFR: 92 mL/min/1.73m2

## 2024-12-02 LAB — RNP ANTIBODY: Ribonucleic Protein(ENA) Antibody, IgG: 1.1 AI — AB

## 2024-12-02 LAB — SEDIMENTATION RATE: Sed Rate: 11 mm/h (ref 0–30)

## 2024-12-02 LAB — PROTEIN / CREATININE RATIO, URINE
Creatinine, Urine: 80 mg/dL (ref 20–275)
Protein/Creat Ratio: 75 mg/g{creat} (ref 24–184)
Protein/Creatinine Ratio: 0.075 mg/mg{creat} (ref 0.024–0.184)
Total Protein, Urine: 6 mg/dL (ref 5–24)

## 2024-12-02 LAB — ANTI-DNA ANTIBODY, DOUBLE-STRANDED: ds DNA Ab: 1 [IU]/mL

## 2024-12-02 LAB — ANA: Anti Nuclear Antibody (ANA): NEGATIVE

## 2024-12-02 LAB — C3 AND C4
C3 Complement: 124 mg/dL (ref 83–193)
C4 Complement: 16 mg/dL (ref 15–57)

## 2024-12-03 ENCOUNTER — Ambulatory Visit: Payer: Self-pay | Admitting: Physician Assistant

## 2024-12-03 NOTE — Progress Notes (Signed)
 CMP and CBC WNL   Urine protein creatinine ratio WNL ESR WNL RNP remains positive  Complements WNL ANA negative  dsDNA negative

## 2024-12-17 ENCOUNTER — Institutional Professional Consult (permissible substitution): Admitting: Neurology

## 2024-12-31 ENCOUNTER — Ambulatory Visit: Admitting: Family Medicine

## 2025-01-07 ENCOUNTER — Institutional Professional Consult (permissible substitution): Admitting: Neurology

## 2025-03-01 ENCOUNTER — Ambulatory Visit: Admitting: Physician Assistant
